# Patient Record
Sex: Male | Born: 1950 | Race: White | Hispanic: No | Marital: Married | Smoking: Never smoker
Health system: Southern US, Community
[De-identification: ages and names within clinical notes are randomized; demographics above are authoritative.]

## PROBLEM LIST (undated history)

## (undated) DIAGNOSIS — I10 Essential (primary) hypertension: Secondary | ICD-10-CM

## (undated) DIAGNOSIS — M2550 Pain in unspecified joint: Secondary | ICD-10-CM

## (undated) DIAGNOSIS — I38 Endocarditis, valve unspecified: Secondary | ICD-10-CM

## (undated) DIAGNOSIS — M549 Dorsalgia, unspecified: Secondary | ICD-10-CM

## (undated) DIAGNOSIS — R0602 Shortness of breath: Secondary | ICD-10-CM

## (undated) DIAGNOSIS — G4733 Obstructive sleep apnea (adult) (pediatric): Secondary | ICD-10-CM

## (undated) DIAGNOSIS — I2510 Atherosclerotic heart disease of native coronary artery without angina pectoris: Secondary | ICD-10-CM

## (undated) DIAGNOSIS — E785 Hyperlipidemia, unspecified: Secondary | ICD-10-CM

## (undated) DIAGNOSIS — T148XXA Other injury of unspecified body region, initial encounter: Secondary | ICD-10-CM

## (undated) DIAGNOSIS — R002 Palpitations: Secondary | ICD-10-CM

## (undated) DIAGNOSIS — J45909 Unspecified asthma, uncomplicated: Secondary | ICD-10-CM

## (undated) DIAGNOSIS — E119 Type 2 diabetes mellitus without complications: Secondary | ICD-10-CM

## (undated) HISTORY — PX: CARDIOVASCULAR STRESS TEST: SHX262

## (undated) HISTORY — DX: Atherosclerotic heart disease of native coronary artery without angina pectoris: I25.10

## (undated) HISTORY — DX: Dorsalgia, unspecified: M54.9

## (undated) HISTORY — DX: Other injury of unspecified body region, initial encounter: T14.8XXA

## (undated) HISTORY — DX: Endocarditis, valve unspecified: I38

## (undated) HISTORY — PX: TUMOR REMOVAL: SHX12

## (undated) HISTORY — DX: Palpitations: R00.2

## (undated) HISTORY — DX: Essential (primary) hypertension: I10

## (undated) HISTORY — PX: APPENDECTOMY: SHX54

## (undated) HISTORY — PX: CARDIAC CATHETERIZATION: SHX172

## (undated) HISTORY — DX: Obstructive sleep apnea (adult) (pediatric): G47.33

## (undated) HISTORY — PX: TONSILLECTOMY: SUR1361

## (undated) HISTORY — DX: Hyperlipidemia, unspecified: E78.5

## (undated) HISTORY — DX: Pain in unspecified joint: M25.50

## (undated) HISTORY — DX: Shortness of breath: R06.02

## (undated) HISTORY — DX: Unspecified asthma, uncomplicated: J45.909

## (undated) HISTORY — DX: Type 2 diabetes mellitus without complications: E11.9

---

## 2004-04-07 ENCOUNTER — Ambulatory Visit: Payer: Self-pay | Admitting: Internal Medicine

## 2004-04-21 ENCOUNTER — Ambulatory Visit: Payer: Self-pay | Admitting: Internal Medicine

## 2004-08-09 ENCOUNTER — Ambulatory Visit: Payer: Self-pay | Admitting: Family Medicine

## 2004-08-17 ENCOUNTER — Ambulatory Visit: Payer: Self-pay | Admitting: Family Medicine

## 2004-11-15 ENCOUNTER — Ambulatory Visit: Payer: Self-pay | Admitting: Family Medicine

## 2005-03-28 ENCOUNTER — Ambulatory Visit: Payer: Self-pay | Admitting: Family Medicine

## 2005-07-04 ENCOUNTER — Encounter: Admission: RE | Admit: 2005-07-04 | Discharge: 2005-07-04 | Payer: Self-pay | Admitting: Family Medicine

## 2005-07-04 ENCOUNTER — Ambulatory Visit: Payer: Self-pay | Admitting: Family Medicine

## 2006-04-28 ENCOUNTER — Ambulatory Visit: Payer: Self-pay | Admitting: Internal Medicine

## 2006-04-28 LAB — CONVERTED CEMR LAB
ALT: 15 units/L (ref 0–53)
AST: 14 units/L (ref 0–37)
BUN: 17 mg/dL (ref 6–23)
CO2: 23 meq/L (ref 19–32)
Calcium: 9.8 mg/dL (ref 8.4–10.5)
Chloride: 102 meq/L (ref 96–112)
Creatinine, Ser: 0.7 mg/dL (ref 0.40–1.50)
Glucose, Bld: 208 mg/dL — ABNORMAL HIGH (ref 70–99)
Hgb A1c MFr Bld: 7.4 % — ABNORMAL HIGH (ref 4.6–6.1)
Microalb, Ur: 3.08 mg/dL — ABNORMAL HIGH (ref 0.00–1.89)
Potassium: 3.9 meq/L (ref 3.5–5.3)
Sodium: 137 meq/L (ref 135–145)

## 2007-01-11 ENCOUNTER — Telehealth (INDEPENDENT_AMBULATORY_CARE_PROVIDER_SITE_OTHER): Payer: Self-pay | Admitting: *Deleted

## 2007-04-19 ENCOUNTER — Ambulatory Visit: Payer: Self-pay | Admitting: Family Medicine

## 2007-04-19 DIAGNOSIS — E1165 Type 2 diabetes mellitus with hyperglycemia: Secondary | ICD-10-CM

## 2007-04-19 DIAGNOSIS — E119 Type 2 diabetes mellitus without complications: Secondary | ICD-10-CM | POA: Insufficient documentation

## 2007-04-19 DIAGNOSIS — J45909 Unspecified asthma, uncomplicated: Secondary | ICD-10-CM | POA: Insufficient documentation

## 2007-05-02 LAB — CONVERTED CEMR LAB
ALT: 18 units/L (ref 0–53)
AST: 21 units/L (ref 0–37)
Albumin: 4.2 g/dL (ref 3.5–5.2)
Alkaline Phosphatase: 46 units/L (ref 39–117)
BUN: 9 mg/dL (ref 6–23)
Basophils Absolute: 0 10*3/uL (ref 0.0–0.1)
Basophils Relative: 0.6 % (ref 0.0–1.0)
Bilirubin, Direct: 0.1 mg/dL (ref 0.0–0.3)
CO2: 25 meq/L (ref 19–32)
Calcium: 9.7 mg/dL (ref 8.4–10.5)
Chloride: 100 meq/L (ref 96–112)
Cholesterol: 220 mg/dL (ref 0–200)
Creatinine, Ser: 0.8 mg/dL (ref 0.4–1.5)
Creatinine,U: 108.2 mg/dL
Direct LDL: 134.1 mg/dL
Eosinophils Absolute: 0.3 10*3/uL (ref 0.0–0.6)
Eosinophils Relative: 4.1 % (ref 0.0–5.0)
GFR calc Af Amer: 128 mL/min
GFR calc non Af Amer: 106 mL/min
Glucose, Bld: 153 mg/dL — ABNORMAL HIGH (ref 70–99)
HCT: 44.6 % (ref 39.0–52.0)
HDL: 47.1 mg/dL (ref >39.0)
Hemoglobin: 14.8 g/dL (ref 13.0–17.0)
Hgb A1c MFr Bld: 7.7 % — ABNORMAL HIGH (ref 4.6–6.0)
Lymphocytes Relative: 27.1 % (ref 12.0–46.0)
MCHC: 33.2 g/dL (ref 30.0–36.0)
MCV: 86.4 fL (ref 78.0–100.0)
Microalb Creat Ratio: 32.3 mg/g — ABNORMAL HIGH (ref 0.0–30.0)
Microalb, Ur: 3.5 mg/dL — ABNORMAL HIGH (ref 0.0–1.9)
Monocytes Absolute: 0.4 10*3/uL (ref 0.2–0.7)
Monocytes Relative: 6.5 % (ref 3.0–11.0)
Neutro Abs: 4 10*3/uL (ref 1.4–7.7)
Neutrophils Relative %: 61.7 % (ref 43.0–77.0)
Platelets: 256 10*3/uL (ref 150–400)
Potassium: 4.5 meq/L (ref 3.5–5.1)
RBC: 5.16 M/uL (ref 4.22–5.81)
RDW: 11.9 % (ref 11.5–14.6)
Sodium: 136 meq/L (ref 135–145)
Total Bilirubin: 0.8 mg/dL (ref 0.3–1.2)
Total CHOL/HDL Ratio: 4.7
Total Protein: 6.9 g/dL (ref 6.0–8.3)
Triglycerides: 165 mg/dL — ABNORMAL HIGH (ref 0–149)
VLDL: 33 mg/dL (ref 0–40)
WBC: 6.5 10*3/uL (ref 4.5–10.5)

## 2007-08-29 ENCOUNTER — Ambulatory Visit: Payer: Self-pay | Admitting: Internal Medicine

## 2007-08-29 DIAGNOSIS — L0390 Cellulitis, unspecified: Secondary | ICD-10-CM

## 2007-08-29 DIAGNOSIS — L0291 Cutaneous abscess, unspecified: Secondary | ICD-10-CM | POA: Insufficient documentation

## 2007-09-18 ENCOUNTER — Ambulatory Visit: Payer: Self-pay | Admitting: Family Medicine

## 2007-09-18 DIAGNOSIS — R609 Edema, unspecified: Secondary | ICD-10-CM | POA: Insufficient documentation

## 2007-09-27 ENCOUNTER — Encounter (INDEPENDENT_AMBULATORY_CARE_PROVIDER_SITE_OTHER): Payer: Self-pay | Admitting: *Deleted

## 2007-09-27 LAB — CONVERTED CEMR LAB
ALT: 13 units/L (ref 0–53)
AST: 16 units/L (ref 0–37)
Albumin: 4.2 g/dL (ref 3.5–5.2)
Alkaline Phosphatase: 44 units/L (ref 39–117)
BUN: 12 mg/dL (ref 6–23)
Bilirubin, Direct: 0.1 mg/dL (ref 0.0–0.3)
CO2: 29 meq/L (ref 19–32)
Calcium: 9.3 mg/dL (ref 8.4–10.5)
Chloride: 100 meq/L (ref 96–112)
Cholesterol: 160 mg/dL (ref 0–200)
Creatinine, Ser: 0.6 mg/dL (ref 0.4–1.5)
GFR calc Af Amer: 179 mL/min
GFR calc non Af Amer: 148 mL/min
Glucose, Bld: 120 mg/dL — ABNORMAL HIGH (ref 70–99)
HDL: 54 mg/dL (ref >39.0)
Hgb A1c MFr Bld: 6.7 % — ABNORMAL HIGH (ref 4.6–6.0)
LDL Cholesterol: 90 mg/dL (ref 0–99)
Potassium: 3.7 meq/L (ref 3.5–5.1)
Sodium: 138 meq/L (ref 135–145)
Total Bilirubin: 0.9 mg/dL (ref 0.3–1.2)
Total CHOL/HDL Ratio: 3
Total Protein: 6.9 g/dL (ref 6.0–8.3)
Triglycerides: 78 mg/dL (ref 0–149)
VLDL: 16 mg/dL (ref 0–40)

## 2007-10-19 ENCOUNTER — Encounter: Payer: Self-pay | Admitting: Family Medicine

## 2008-06-02 ENCOUNTER — Emergency Department (HOSPITAL_BASED_OUTPATIENT_CLINIC_OR_DEPARTMENT_OTHER): Admission: EM | Admit: 2008-06-02 | Discharge: 2008-06-02 | Payer: Self-pay | Admitting: Emergency Medicine

## 2008-06-02 ENCOUNTER — Telehealth (INDEPENDENT_AMBULATORY_CARE_PROVIDER_SITE_OTHER): Payer: Self-pay | Admitting: *Deleted

## 2008-06-02 ENCOUNTER — Ambulatory Visit: Payer: Self-pay | Admitting: Radiology

## 2008-06-03 ENCOUNTER — Encounter: Payer: Self-pay | Admitting: Family Medicine

## 2008-06-04 ENCOUNTER — Encounter (INDEPENDENT_AMBULATORY_CARE_PROVIDER_SITE_OTHER): Payer: Self-pay | Admitting: *Deleted

## 2008-06-04 ENCOUNTER — Telehealth (INDEPENDENT_AMBULATORY_CARE_PROVIDER_SITE_OTHER): Payer: Self-pay | Admitting: *Deleted

## 2008-06-04 DIAGNOSIS — R930 Abnormal findings on diagnostic imaging of skull and head, not elsewhere classified: Secondary | ICD-10-CM | POA: Insufficient documentation

## 2008-06-10 ENCOUNTER — Ambulatory Visit: Payer: Self-pay | Admitting: Family Medicine

## 2008-06-10 ENCOUNTER — Encounter: Payer: Self-pay | Admitting: Family Medicine

## 2008-06-11 ENCOUNTER — Ambulatory Visit (HOSPITAL_COMMUNITY): Admission: RE | Admit: 2008-06-11 | Discharge: 2008-06-11 | Payer: Self-pay | Admitting: Cardiovascular Disease

## 2008-06-30 ENCOUNTER — Encounter (INDEPENDENT_AMBULATORY_CARE_PROVIDER_SITE_OTHER): Payer: Self-pay | Admitting: *Deleted

## 2008-08-21 ENCOUNTER — Encounter: Payer: Self-pay | Admitting: Family Medicine

## 2008-10-31 ENCOUNTER — Telehealth (INDEPENDENT_AMBULATORY_CARE_PROVIDER_SITE_OTHER): Payer: Self-pay | Admitting: *Deleted

## 2008-12-15 ENCOUNTER — Encounter (INDEPENDENT_AMBULATORY_CARE_PROVIDER_SITE_OTHER): Payer: Self-pay | Admitting: *Deleted

## 2008-12-15 ENCOUNTER — Telehealth (INDEPENDENT_AMBULATORY_CARE_PROVIDER_SITE_OTHER): Payer: Self-pay | Admitting: *Deleted

## 2009-03-20 ENCOUNTER — Ambulatory Visit: Payer: Self-pay | Admitting: Family Medicine

## 2009-03-20 DIAGNOSIS — I10 Essential (primary) hypertension: Secondary | ICD-10-CM | POA: Insufficient documentation

## 2009-03-20 DIAGNOSIS — Z9119 Patient's noncompliance with other medical treatment and regimen: Secondary | ICD-10-CM | POA: Insufficient documentation

## 2009-03-20 DIAGNOSIS — M948X9 Other specified disorders of cartilage, unspecified sites: Secondary | ICD-10-CM | POA: Insufficient documentation

## 2009-03-23 LAB — CONVERTED CEMR LAB
ALT: 25 units/L (ref 0–53)
AST: 20 units/L (ref 0–37)
Albumin: 4.2 g/dL (ref 3.5–5.2)
Alkaline Phosphatase: 56 units/L (ref 39–117)
BUN: 16 mg/dL (ref 6–23)
Basophils Absolute: 0.1 10*3/uL (ref 0.0–0.1)
Basophils Relative: 0.8 % (ref 0.0–3.0)
Bilirubin, Direct: 0 mg/dL (ref 0.0–0.3)
CO2: 28 meq/L (ref 19–32)
Calcium: 9.3 mg/dL (ref 8.4–10.5)
Chloride: 103 meq/L (ref 96–112)
Cholesterol: 268 mg/dL — ABNORMAL HIGH (ref 0–200)
Creatinine, Ser: 0.7 mg/dL (ref 0.4–1.5)
Creatinine,U: 140.8 mg/dL
Direct LDL: 198.6 mg/dL
Eosinophils Absolute: 0.3 10*3/uL (ref 0.0–0.7)
Eosinophils Relative: 4.6 % (ref 0.0–5.0)
GFR calc non Af Amer: 122.56 mL/min (ref >60)
Glucose, Bld: 173 mg/dL — ABNORMAL HIGH (ref 70–99)
HCT: 45.3 % (ref 39.0–52.0)
HDL: 57.1 mg/dL (ref >39.00)
Hemoglobin: 14.8 g/dL (ref 13.0–17.0)
Hgb A1c MFr Bld: 7.8 % — ABNORMAL HIGH (ref 4.6–6.5)
Lymphocytes Relative: 25.5 % (ref 12.0–46.0)
Lymphs Abs: 1.6 10*3/uL (ref 0.7–4.0)
MCHC: 32.6 g/dL (ref 30.0–36.0)
MCV: 89.8 fL (ref 78.0–100.0)
Microalb Creat Ratio: 19.2 mg/g (ref 0.0–30.0)
Microalb, Ur: 2.7 mg/dL — ABNORMAL HIGH (ref 0.0–1.9)
Monocytes Absolute: 0.5 10*3/uL (ref 0.1–1.0)
Monocytes Relative: 8.3 % (ref 3.0–12.0)
Neutro Abs: 3.8 10*3/uL (ref 1.4–7.7)
Neutrophils Relative %: 60.8 % (ref 43.0–77.0)
PSA: 1.93 ng/mL (ref 0.10–4.00)
Platelets: 216 10*3/uL (ref 150.0–400.0)
Potassium: 3.9 meq/L (ref 3.5–5.1)
RBC: 5.05 M/uL (ref 4.22–5.81)
RDW: 11.7 % (ref 11.5–14.6)
Sodium: 138 meq/L (ref 135–145)
Total Bilirubin: 1.1 mg/dL (ref 0.3–1.2)
Total CHOL/HDL Ratio: 5
Total Protein: 7.2 g/dL (ref 6.0–8.3)
Triglycerides: 189 mg/dL — ABNORMAL HIGH (ref 0.0–149.0)
VLDL: 37.8 mg/dL (ref 0.0–40.0)
WBC: 6.3 10*3/uL (ref 4.5–10.5)

## 2009-04-03 ENCOUNTER — Encounter (HOSPITAL_COMMUNITY): Admission: RE | Admit: 2009-04-03 | Discharge: 2009-06-04 | Payer: Self-pay | Admitting: Family Medicine

## 2010-03-21 ENCOUNTER — Encounter: Payer: Self-pay | Admitting: Internal Medicine

## 2010-03-30 NOTE — Letter (Signed)
Summary: Results Follow-up Letter  [place] at [facility]  [address] [place], [place] [address]   Phone: [phone]  Fax: [phone]    06/30/2008        Zachary Michael [address] [place], [place]  [address]  Dear [name],   The following are the results of your recent test(s):  Test     Result     Pap Smear    Normal_______  Not Normal_____       Comments: _________________________________________________________ Cholesterol LDL(Bad cholesterol):          Your goal is less than:         HDL (Good cholesterol):        Your goal is more than: _________________________________________________________ Other Tests:   _________________________________________________________  Please call for an appointment Or Please see attached bone density._________________________________________________________ _________________________________________________________ _________________________________________________________  Sincerely,  Felecia Deloach CMA [place] at [facility]

## 2010-03-30 NOTE — Progress Notes (Signed)
Summary: fyi ED//lowne  Phone Note Call from Patient Call back at Work Phone [phone]   Caller: Patient Summary of Call: pt called of chest pains mid center pressure, into head pariculary left side. Recommend pt to ED for full assessment pt agreed Initial call taken by: Kandice Hams,  June 02, 2008 10:21 AM      Appended Document: Orders Update flag received from Dr Lesia Hausen---- ER called.  Pt had abnormality on CXR that needs f/u.  Needs Bone Scan to f/u abn on rib   Clinical Lists Changes  Problems: Added new problem of CHEST XRAY, ABNORMAL (ICD-793.1) Orders: Added new Referral order of Radiology Referral (Radiology) - Signed

## 2010-03-30 NOTE — Progress Notes (Signed)
Summary: REFILL  Phone Note Refill Request Message from:  [facility]arm[id number] FAX [phone]  Refills Requested: Medication #1:  ACTOS 45 MG  TABS 1 by mouth once daily Initial call taken by: Barb Merino,  October 31, 2008 4:35 PM    Prescriptions: METFORMIN HCL 1000 MG  TABS (METFORMIN HCL) take one tablet twice daily with meals  #60 x 0   Entered by:   Kandice Hams   Authorized by:   Loreen Freud DO   Signed by:   Kandice Hams on 10/31/2008   Method used:   [facility]ed to ...       [facility] [address] (retail)       [address]       [place], [place]  [address]       Ph: [phone] or [phone]       Fax: [phone]   RxID:   [id number]

## 2010-03-30 NOTE — Medication Information (Signed)
Summary: Letter Concerning Potential Interaction Between ACTOS & Edema/Me  Letter Concerning Potential Interaction Between ACTOS & Ed[facility]   Imported By: Lanelle Bal 10/19/2007 12:04:43  _____________________________________________________________________  External Attachment:    Type:   Image     Comment:   External Document

## 2010-03-30 NOTE — Letter (Signed)
Summary: Results Follow-up Letter  [place] at [facility]  [address] [place], [place] [address]   Phone: [phone]  Fax: [phone]    06/04/2008        Zachary Michael [address] [place], [place]  [address]  Dear Mr. Sotelo,   The following are the results of your recent test(s):  Test     Result     Pap Smear    Normal_______  Not Normal_____       Comments: _________________________________________________________ Cholesterol LDL(Bad cholesterol):          Your goal is less than:         HDL (Good cholesterol):        Your goal is more than: _________________________________________________________ Other Tests:   _________________________________________________________  Please call for an appointment Or _Please call our office, we have been trying to contact you in regards to your xray results.________________________________________________________ _________________________________________________________ _________________________________________________________  Sincerely,  Ardy[place] at [facility]

## 2010-03-30 NOTE — Progress Notes (Signed)
Summary: Mailed letter requesting pt. to call office 06/04/08  Phone Note Outgoing Call Call back at [facility] Phone [phone] Call back at Work Phone [phone]   Call placed by: Ardyth Man,  June 04, 2008 12:29 PM Call placed to: Patient Summary of Call: flag received from Dr Lesia Hausen---- ER called.  Pt had abnormality on CXR that needs f/u.  Needs Bone Scan to f/u abn on rib    Both #'s work and home have been disconnected.  Will mail letter asking patient to call the office Ardyth Man  June 04, 2008 12:30 PM

## 2010-03-30 NOTE — Miscellaneous (Signed)
Summary: Orders Update  Clinical Lists Changes  Problems: Added new problem of FAMILY HISTORY OSTEOPOROSIS (ICD-V17.81)

## 2010-03-30 NOTE — Progress Notes (Signed)
Summary: lowne-refill  Phone Note Refill Request Message from:  Fax from Pharmacy on [facility]  Refills Requested: Medication #1:  METFORMIN HCL 1000 MG  TABS take one tablet twice daily with meals  Medication #2:  ACTOS 45 MG  TABS 1 by mouth once daily fax [phone]  Initial call taken by: [facility] CMA,  December 15, 2008 11:40 AM  Follow-up for Phone Call        please call pt to schedule labs and OV.250.00,272.4  lipid, hep, hgba1c, bmp........Marland KitchenFelecia Deloach CMA  December 15, 2008 11:57 AM   tried to contact pt VM not set up on phone,letter mailed informing pt labs and OV due now.......Marland KitchenFelecia Deloach CMA  December 15, 2008 11:56 AM     Additional Follow-up for Phone Call Additional follow up Details #2::    LEFT MESSAGE WITH WIFE FOR PT TO CALL AND ASK FOR KENDRA...Marland KitchenBarb Merino  December 16, 2008 8:34 AM  Follow-up by: Barb Merino,  December 16, 2008 8:34 AM  New/Updated Medications: METFORMIN HCL 1000 MG  TABS (METFORMIN HCL) take one tablet twice daily with meals**LABS & OFFICE VISIT DUE NOW ACTOS 45 MG  TABS (PIOGLITAZONE HCL) 1 by mouth once daily**LABS OFFICE VISIT DUE NOW** Prescriptions: ACTOS 45 MG  TABS (PIOGLITAZONE HCL) 1 by mouth once daily**LABS OFFICE VISIT DUE NOW**  #90 x 0   Entered by:   Jeremy Johann CMA   Authorized by:   Loreen Freud DO   Signed by:   Jeremy Johann CMA on 12/15/2008   Method used:   [facility]ed to ...       [id number]       [address]       [place], [place]  [address]       Ph: [phone] or [phone]       Fax: [phone]   RxID:   [id number] METFORMIN HCL 1000 MG  TABS (METFORMIN HCL) take one tablet twice daily with meals**LABS & OFFICE VISIT DUE NOW  #180 x 0   Entered by:   Jeremy Johann CMA   Authorized by:   Loreen Freud DO   Signed by:   Jeremy Johann CMA on 12/15/2008   Method used:   F[facility]ed to ...       [facility] [address] (retail)       [address]       [place], [place]   [address]       Ph: [phone] or [phone]       Fax: [phone]   RxID:   [id number]

## 2010-03-30 NOTE — Assessment & Plan Note (Signed)
Summary: acute for right leg pain--PH   Vital Signs:  Patient Profile:   60 Years Old Male Weight:      249.0 pounds Temp:     98.0 degrees F oral Pulse rate:   84 / minute Resp:     13 per minute BP sitting:   120 / 70  (left arm) Cuff size:   large  Pt. in pain?   yes    Location:   RLE ant shin    Intensity:   4    Type:       burning with walking  Vitals Entered By: Shonna Chock (August 29, 2007 3:35 PM)                   Chief Complaint:  RIGHT LEG PAIN X 2 DAYS.  History of Present Illness: No  trigger or injury sun 6/28.Rx: none. Worse with ambulation but also pain @ rest. Pain in 25 cent piece distribution with inf extension with walking. Camping last week ; bitten by mosquitoes RLE. Tick found in trailer.FBS average  <.130     Current Allergies (reviewed today): No known allergies     Risk Factors:  Alcohol use:  no Exercise:  no    Physical Exam  General:     Well-developed,well-nourished,in no acute distress; alert,appropriate and cooperative throughout examination Pulses:     R and Ldorsalis pedis and posterior tibial pulses are full and equal bilaterally Extremities:     1+ left pedal edema and trace right pedal edema.  Neg  Homan's bilat . Tender R ant shin  Skin:     Mild erythema L mid shin with 3X6 mm eschar which is tender to touch    Impression & Recommendations:  Problem # 1:  CELLULITIS AND ABSCESS OF UNSPECIFIED SITE (ICD-682.9)  His updated medication list for this problem includes:    Doxycycline Hyclate 100 Mg Caps (Doxycycline hyclate) .[name]... 1 two times a day   Complete Medication List: 1)  Metformin Hcl 1000 Mg Tabs (Metformin hcl) .... Take one tablet twice daily with meals 2)  Actos 45 Mg Tabs (Pioglitazone hcl) .[name].. 1 by mouth once daily 3)  Zocor 80 Mg Tabs (Simvastatin) .[name].. 1 by mouth at bedtime 4)  Doxycycline Hyclate 100 Mg Caps (Doxycycline hyclate) .[name].. 1 two times a day   Patient Instructions: 1)  Warm  compresses 3-4X/day with elevation. Avoid sun on Doxycycline   Prescriptions: DOXYCYCLINE HYCLATE 100 MG  CAPS (DOXYCYCLINE HYCLATE) 1 two times a day  #14 x 0   Entered and Authorized by:   Marga Melnick MD   Signed by:   Marga Melnick MD on 08/29/2007   Method used:   Print then Give to Patient   RxID:   [id number]  ]  Appended Document: acute for right leg pain--PH RLE 41.5 cm 20 cm below R inf patella; L 44 cm

## 2010-03-30 NOTE — Progress Notes (Signed)
Summary: LOWNE-REFILL METFORMIN HCL 1000MG    Phone Note Refill Request   Refills Requested: Medication #1:  METFORMIN HCL 1000 MG  TABS take one tablet twice daily with meals recd by fax [facility] [phone] ***fax to follow***  Initial call taken by: [name] Pounds,  January 11, 2007 5:11 PM  Follow-up for Phone Call        DR.LOWNE, THIS PATIENT HAD LABS IN JAN 2008 WAS TOLD TO RECHECK IN 3 MONTHS, WE HAVE CONTINUED TO FILL THIS MED MAKING NOTE THAT PATIENT NEEDS OV FOR ADDITIONAL REFILLS. I HAVE SENT A NOTE TO TIFFANY(SCHEDULER) TO SCHEDULE HIM A FASTING LAB OV. I WILL FILL WHEN HE SCHEDULES OV. Follow-up by: Shonna Chock,  January 12, 2007 4:35 PM  Additional Follow-up for Phone Call Additional follow up Details #1::        perfect---   lipid, hgba1c, bmp, hep, microalb  250.00 Additional Follow-up by: Loreen Freud DO,  January 12, 2007 6:10 PM    Additional Follow-up for Phone Call Additional follow up Details #2::    the number [phone] has been disconnected. also his number [phone] also has been disconnceted ..................................................................Marland KitchenCharolette Child  January 15, 2007 3:03 PM per dr.lowne deny rx refill and make note that patient needs ov and fax original request back to [facility]. done ..................................................................Marland KitchenChrae Malloy  January 17, 2007 3:00 PM        Appended Document: LOWNE-REFILL METFORMIN HCL 1000MG   Pt wife came in.  When pt made appt. , he wasnot given one until JUne.   We gave him one next Thurs. and [facility].   We will give 3 month supply next week at ov.

## 2010-03-30 NOTE — Assessment & Plan Note (Signed)
Summary: OUT OF ALL MEDS---OFF WORK TODAY///SPH   Vital Signs:  Patient profile:   60 year old male Height:      70 inches Weight:      259 pounds BMI:     37.30 Temp:     98.2 degrees F oral Pulse rate:   82 / minute Pulse rhythm:   regular BP sitting:   138 / 82  (left arm) Cuff size:   large  Vitals Entered By: Army Fossa CMA (March 20, 2009 10:48 AM) CC: Pt here for refills on his meds has not taken any in 3 months.    History of Present Illness: Pt here for refills and labs.  He has not taken meds in 3 months.    Allergies (verified): No Known Drug Allergies  Past History:  Past medical, surgical, family and social histories (including risk factors) reviewed for relevance to current acute and chronic problems.  Past Medical History: Reviewed history from 04/19/2007 and no changes required. Asthma Diabetes mellitus, type II  Past Surgical History: Reviewed history from 04/19/2007 and no changes required. Appendectomy Tonsillectomy tumor removed L arm  Family History: Reviewed history from 04/19/2007 and no changes required. Family History of Arthritis Family History Diabetes 1st degree relative Family History Hypertension Family History Lung cancer  Social History: Reviewed history from 04/19/2007 and no changes required. Married Never Smoked  Review of Systems      See HPI  Physical Exam  General:  Well-developed,well-nourished,in no acute distress; alert,appropriate and cooperative throughout examination Neck:  No deformities, masses, or tenderness noted. Lungs:  Normal respiratory effort, chest expands symmetrically. Lungs are clear to auscultation, no crackles or wheezes. Heart:  normal rate and no murmur.    Diabetes Management Exam:    Foot Exam (with socks and/or shoes not present):       Sensory-Pinprick/Light touch:          Left medial foot (L-4): normal          Left dorsal foot (L-5): normal          Left lateral foot (S-1):  normal          Right medial foot (L-4): normal          Right dorsal foot (L-5): normal          Right lateral foot (S-1): normal       Sensory-Monofilament:          Left foot: normal          Right foot: normal       Inspection:          Left foot: normal          Right foot: normal       Nails:          Left foot: normal          Right foot: normal   Impression & Recommendations:  Problem # 1:  DIABETES MELLITUS, TYPE II (ICD-250.00)  His updated medication list for this problem includes:    Metformin Hcl 1000 Mg Tabs (Metformin hcl) .[name]... Take one tablet twice daily with meals**labs & office visit due now    Actos 45 Mg Tabs (Pioglitazone hcl) .[name]... 1 by mouth once daily**labs office visit due now**    Lisinopril 10 Mg Tabs (Lisinopril) .[name]... 1 by mouth daily.  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 1 by mouth two  times a day.    Lisinopril 10 Mg Tabs (Lisinopril) .[name]... 1 by mouth daily.  BP today: 138/82 Prior BP: 130/80 (09/18/2007)  Labs Reviewed: K+: 3.7 (09/18/2007) Creat: : 0.6 (09/18/2007)   Chol: 160 (09/18/2007)   HDL: 54.0 (09/18/2007)   LDL: 90 (09/18/2007)   TG: 78 (09/18/2007)  Problem # 4:  PERS HX NONCOMPLIANCE W/MED TX PRS HAZARDS HLTH (ICD-V15.81) d/w pt noncompliance----he said as long as he lives long enough to get his wife into new house he doesn't care if he dies. They have no extra money for meds  all but 2 meds are  generic  Complete Medication List: 1)  Metformin Hcl 1000 Mg Tabs (Metformin hcl) .... Take one tablet twice daily with meals**labs & office visit due now 2)  Actos 45 Mg Tabs (Pioglitazone hcl) .[name].. 1 by mouth once daily**labs office visit due now** 3)  Zocor 80 Mg Tabs (Simvastatin) .[name].. 1 by mouth at bedtime 4)  Zetia 10 Mg Tabs (Ezetimibe) .... Take one tablet daily 5)  Nitro-dur 0.4 Mg/hr Pt24 (Nitroglycerin) .... As needed. 6)  Carvedilol 6.25 Mg Tabs (Carvedilol) .[name].. 1 by mouth two times a day. 7)  Lisinopril 10 Mg Tabs (Lisinopril) .[name].. 1 by mouth daily. Prescriptions: ZOCOR 80 MG  TABS (SIMVASTATIN) 1 by mouth at bedtime  #90 x 0   Entered and Authorized by:   Loreen Freud DO   Signed by:   Loreen Freud DO on 03/20/2009   Method used:   Electronically to        [facility]* (mail-order)             ,          Ph: [phone]       Fax: [phone]   RxID:   [id number] ZETIA 10 MG  TABS (EZETIMIBE) Take one tablet daily  #90 x 3   Entered and Authorized by:   Loreen Freud DO   Signed by:   Loreen Freud DO on 03/20/2009   Method used:   Electronically to        [facility]* (mail-order)             ,          Ph: [phone]       Fax: [phone]   RxID:   [id number] ACTOS 45 MG  TABS (PIOGLITAZONE HCL) 1 by mouth once daily**LABS OFFICE VISIT DUE NOW**  #90 x 3   Entered and Authorized by:   Loreen Freud DO   Signed  by:   Loreen Freud DO on 03/20/2009   Method used:   Electronically to        [facility]* (mail-order)             ,  Ph: [phone]       Fax: [phone]   RxID:   [id number] METFORMIN HCL 1000 MG  TABS (METFORMIN HCL) take one tablet twice daily with meals**LABS & OFFICE VISIT DUE NOW  #180 x 3   Entered and Authorized by:   Loreen Freud DO   Signed by:   Loreen Freud DO on 03/20/2009   Method used:   Electronically to        [facility]* (mail-order)             ,          Ph: [phone]       Fax: [phone]   RxID:   [id number]

## 2010-03-30 NOTE — Assessment & Plan Note (Signed)
Summary: med refill and labs/cdj   Vital Signs:  Patient Profile:   60 Years Old Male Weight:      239 pounds Pulse rate:   80 / minute BP sitting:   128 / 82  (left arm)  Vitals Entered By: Doristine Devoid (April 19, 2007 10:53 AM)                 Chief Complaint:  Diabetes Management and Type 2 diabetes mellitus follow-up.  History of Present Illness:  Type 2 Diabetes Mellitus Follow-Up      This is a 60 year old man who presents for Type 2 diabetes mellitus follow-up.  The patient denies polyuria, polydipsia, blurred vision, self managed hypoglycemia, hypoglycemia requiring help, weight loss, weight gain, and numbness of extremities.  The patient denies the following symptoms: neuropathic pain, chest pain, vomiting, orthostatic symptoms, poor wound healing, intermittent claudication, vision loss, and foot ulcer.  Since the last visit the patient reports poor dietary compliance, noncompliance with medications, not exercising regularly, and not monitoring blood glucose.  Since the last visit, the patient reports having had eye care by an ophthalmologist and no foot care.      Past Medical History:    Asthma    Diabetes mellitus, type II  Past Surgical History:    Appendectomy    Tonsillectomy    tumor removed L arm   Family History:    Family History of Arthritis    Family History Diabetes 1st degree relative    Family History Hypertension    Family History Lung cancer  Social History:    Married    Never Smoked   Risk Factors:  Tobacco use:  never   Review of Systems      See HPI   Physical Exam  General:     Well-developed,well-nourished,in no acute distress; alert,appropriate and cooperative throughout examination Eyes:     vision grossly intact, pupils equal, pupils round, and no injection.   Lungs:     Normal respiratory effort, chest expands symmetrically. Lungs are clear to auscultation, no crackles or wheezes. Heart:     Normal rate and  regular rhythm. S1 and S2 normal without gallop, murmur, click, rub or other extra sounds. Extremities:     No clubbing, cyanosis, edema, or deformity noted with normal full range of motion of all joints.    Diabetes Management Exam:    Foot Exam (with socks and/or shoes not present):       Sensory-Pinprick/Light touch:          Left medial foot (L-4): normal          Left dorsal foot (L-5): normal          Left lateral foot (S-1): normal          Right medial foot (L-4): normal          Right dorsal foot (L-5): normal          Right lateral foot (S-1): normal       Sensory-Monofilament:          Left foot: normal          Right foot: normal       Inspection:          Left foot: normal          Right foot: normal       Nails:          Left foot: normal  Right foot: normal    Eye Exam:       Eye Exam done elsewhere    Impression & Recommendations:  Problem # 1:  DIABETES MELLITUS, TYPE II (ICD-250.00)  His updated medication list for this problem includes:    Metformin Hcl 1000 Mg Tabs (Metformin hcl) .[name]... Take one tablet twice daily with meals    Actos 30 Mg Tabs (Pioglitazone hcl) .[name]... 1 by mouth once daily  Orders: Venipuncture ([address]) TLB-Lipid Panel (80061-LIPID) TLB-BMP (Basic Metabolic Panel-BMET) (80048-METABOL) TLB-CBC Platelet - w/Differential (85025-CBCD) TLB-Hepatic/Liver Function Pnl (80076-HEPATIC) TLB-A1C / Hgb A1C (Glycohemoglobin) (83036-A1C) TLB-Microalbumin/Creat Ratio, Urine (82043-MALB)  Labs Reviewed: HgBA1c: 7.4 (04/28/2006)   Creat: 0.70 (04/28/2006)      Complete Medication List: 1)  Metformin Hcl 1000 Mg Tabs (Metformin hcl) .... Take one tablet twice daily with meals 2)  Paroxetine Hcl 30 Mg Tabs (Paroxetine hcl) .... Take one tablet daily 3)  Actos 30 Mg Tabs (Pioglitazone hcl) .[name].. 1 by mouth once daily                                                              Prescriptions: ACTOS 30 MG  TABS (PIOGLITAZONE HCL) 1 by  mouth once daily  #90 x 3   Entered and Authorized by:   Loreen Freud DO   Signed by:   Loreen Freud DO on 04/19/2007   Method used:   Print then Give to Patient   RxID:   [id number] METFORMIN HCL 1000 MG  TABS (METFORMIN HCL) take one tablet twice daily with meals  #180 x 3   Entered and Authorized by:   Loreen Freud DO   Signed by:   Loreen Freud DO on 04/19/2007   Method used:   Print then Give to Patient   RxID:   [id number]  ]  Appended Document: med refill and labs/cdj  Laboratory Results   Urine Tests   Date/Time Reported: April 19, 2007 11:35 AM   Routine Urinalysis   Color: yellow Appearance: Hazy Glucose: negative   (Normal Range: Negative) Bilirubin: negative   (Normal Range: Negative) Ketone: negative   (Normal Range: Negative) Spec. Gravity: 1.015   (Normal Range: 1.003-1.035) Blood: negative   (Normal Range: Negative) pH: 6.0   (Normal Range: 5.0-8.0) Protein: trace   (Normal Range: Negative) Urobilinogen: negative   (Normal Range: 0-1) Nitrite: negative   (Normal Range: Negative) Leukocyte Esterace: negative   (Normal Range: Negative)    Comments: ...................................................................Marland KitchenFloydene Flock CMA  April 19, 2007 11:36 AM

## 2010-03-30 NOTE — Assessment & Plan Note (Signed)
Summary: FOLLOW UP ON SWELLING/CDJ   Vital Signs:  Patient Profile:   60 Years Old Male Weight:      249 pounds Temp:     98.4 degrees F oral Pulse rate:   76 / minute Resp:     14 per minute BP sitting:   130 / 80  (right arm)  Pt. in pain?   no  Vitals Entered By: Ardyth Man (September 18, 2007 1:19 PM)                  PCP:  Laury Axon  Chief Complaint:  follow up on swelling in legs, "getting better, and but still there".  History of Present Illness: Pt is f/u on swelling in legs.  The infection is better but there is still some swelling in both leg R>L. No more pain.       Current Allergies: No known allergies   Past Medical History:    Reviewed history from 04/19/2007 and no changes required:       Asthma       Diabetes mellitus, type II  Past Surgical History:    Reviewed history from 04/19/2007 and no changes required:       Appendectomy       Tonsillectomy       tumor removed L arm   Family History:    Reviewed history from 04/19/2007 and no changes required:       Family History of Arthritis       Family History Diabetes 1st degree relative       Family History Hypertension       Family History Lung cancer  Social History:    Reviewed history from 04/19/2007 and no changes required:       Married       Never Smoked    Review of Systems      See HPI   Physical Exam  General:     Well-developed,well-nourished,in no acute distress; alert,appropriate and cooperative throughout examination Neck:     No deformities, masses, or tenderness noted. Lungs:     Normal respiratory effort, chest expands symmetrically. Lungs are clear to auscultation, no crackles or wheezes. Heart:     normal rate, regular rhythm, and no murmur.   Extremities:     left pretibial edema and right pretibial edema.  no calf pain  no errythema Psych:     Oriented X3, memory intact for recent and remote, and normally interactive.      Impression &  Recommendations:  Problem # 1:  EDEMA (ICD-782.3) Discussed elevation of the legs, use of compression stockings, sodium restiction, and medication use. --- pt did not want to use medication at this time He will rto if symptoms do not improve.  Problem # 2:  CELLULITIS AND ABSCESS OF UNSPECIFIED SITE (ICD-682.9) Assessment: Improved  The following medications were removed from the medication list:    Doxycycline Hyclate 100 Mg Caps (Doxycycline hyclate) .[name]... 1 two times a day  His updated medication list for this problem includes:    Doxycycline Hyclate 100 Mg Tabs (Doxycycline hyclate) .[name]... 1 by mouth two times a day for 7 days   Complete Medication List: 1)  Metformin Hcl 1000 Mg Tabs (Metformin hcl) .... Take one tablet twice daily with meals 2)  Actos 45 Mg Tabs (Pioglitazone hcl) .[name].. 1 by mouth once daily 3)  Zocor 80 Mg Tabs (Simvastatin) .[name].. 1 by mouth at bedtime 4)  Doxycycline Hyclate 100 Mg Tabs (Doxycycline  hyclate) .[name].. 1 by mouth two times a day for 7 days  Other Orders: Venipuncture (91478) TLB-Lipid Panel (80061-LIPID) TLB-BMP (Basic Metabolic Panel-BMET) (80048-METABOL) TLB-Hepatic/Liver Function Pnl (80076-HEPATIC) TLB-A1C / Hgb A1C (Glycohemoglobin) (83036-A1C)    Prescriptions: DOXYCYCLINE HYCLATE 100 MG  TABS (DOXYCYCLINE HYCLATE) 1 by mouth two times a day for 7 days  #14 x 0   Entered and Authorized by:   Loreen Freud DO   Signed by:   Loreen Freud DO on 09/18/2007   Method used:   Electronically sent to ...       [id number]       [address]       [place], [place]  [address]       Ph: [phone] or [phone]       Fax: [phone]   RxID:   [id number] ZOCOR 80 MG  TABS (SIMVASTATIN) 1 by mouth at bedtime  #90 x 3   Entered and Authorized by:   Loreen Freud DO   Signed by:   Loreen Freud DO on 09/18/2007   Method used:   Print then Give to Patient   RxID:   [id number] ACTOS 45 MG  TABS (PIOGLITAZONE HCL) 1 by mouth  once daily  #90 x 3   Entered and Authorized by:   Loreen Freud DO   Signed by:   Loreen Freud DO on 09/18/2007   Method used:   Print then Give to Patient   RxID:   [id number] METFORMIN HCL 1000 MG  TABS (METFORMIN HCL) take one tablet twice daily with meals  #180 x 3   Entered and Authorized by:   Loreen Freud DO   Signed by:   Loreen Freud DO on 09/18/2007   Method used:   Print then Give to Patient   RxID:   [id number]  ]

## 2010-03-30 NOTE — Letter (Signed)
Summary: [facility]   Imported By: Lanelle Bal 06/12/2008 12:28:30  _____________________________________________________________________  External Attachment:    Type:   Image     Comment:   External Document

## 2010-03-30 NOTE — Miscellaneous (Signed)
Summary: BONE DENSITY  Clinical Lists Changes  Orders: Added new Test order of T-Bone Densitometry (77080) - Signed Added new Test order of T-Lumbar Vertebral Assessment (77082) - Signed 

## 2010-03-30 NOTE — Letter (Signed)
Summary: [facility]   Imported By: Lanelle Bal 08/27/2008 14:13:54  _____________________________________________________________________  External Attachment:    Type:   Image     Comment:   External Document

## 2010-03-30 NOTE — Letter (Signed)
Summary: Results Follow-up Letter  [place] [facility]  [address] [place], [place] [address]   Phone: [phone]  Fax: [phone]    09/27/2007        Zachary Michael [address] [place], [place]  [address]  Dear [name],   The following are the results of your recent test(s):  Test     Result     Pap Smear    Normal_______  Not Normal_____       Comments: _________________________________________________________ Cholesterol LDL(Bad cholesterol):          Your goal is less than:         HDL (Good cholesterol):        Your goal is more than: _________________________________________________________ Other Tests:   _________________________________________________________  Please call for an appointment Or ___Please see attached lab report and new medication added.______________________________________________________ _________________________________________________________ _________________________________________________________  Sincerely,  Ardyth Man [place] [facility]

## 2010-06-09 LAB — DIFFERENTIAL
Basophils Absolute: 0 10*3/uL (ref 0.0–0.1)
Basophils Relative: 1 % (ref 0–1)
Eosinophils Absolute: 0.2 10*3/uL (ref 0.0–0.7)
Eosinophils Relative: 3 % (ref 0–5)
Lymphocytes Relative: 21 % (ref 12–46)
Lymphs Abs: 1.4 10*3/uL (ref 0.7–4.0)
Monocytes Absolute: 0.4 10*3/uL (ref 0.1–1.0)
Monocytes Relative: 6 % (ref 3–12)
Neutro Abs: 4.9 10*3/uL (ref 1.7–7.7)
Neutrophils Relative %: 69 % (ref 43–77)

## 2010-06-09 LAB — COMPREHENSIVE METABOLIC PANEL
ALT: 14 U/L (ref 0–53)
AST: 26 U/L (ref 0–37)
Albumin: 4.4 g/dL (ref 3.5–5.2)
Alkaline Phosphatase: 51 U/L (ref 39–117)
BUN: 19 mg/dL (ref 6–23)
CO2: 26 mEq/L (ref 19–32)
Calcium: 9.2 mg/dL (ref 8.4–10.5)
Chloride: 104 mEq/L (ref 96–112)
Creatinine, Ser: 0.6 mg/dL (ref 0.4–1.5)
GFR calc Af Amer: >60 mL/min (ref >60)
GFR calc non Af Amer: >60 mL/min (ref >60)
Glucose, Bld: 115 mg/dL — ABNORMAL HIGH (ref 70–99)
Potassium: 3.9 mEq/L (ref 3.5–5.1)
Sodium: 139 mEq/L (ref 135–145)
Total Bilirubin: 0.7 mg/dL (ref 0.3–1.2)
Total Protein: 7.3 g/dL (ref 6.0–8.3)

## 2010-06-09 LAB — CBC
HCT: 39.6 % (ref 39.0–52.0)
Hemoglobin: 13.9 g/dL (ref 13.0–17.0)
MCHC: 35 g/dL (ref 30.0–36.0)
MCV: 88.1 fL (ref 78.0–100.0)
Platelets: 175 10*3/uL (ref 150–400)
RBC: 4.49 MIL/uL (ref 4.22–5.81)
RDW: 12.7 % (ref 11.5–15.5)
WBC: 6.9 10*3/uL (ref 4.0–10.5)

## 2010-06-09 LAB — POCT B-TYPE NATRIURETIC PEPTIDE (BNP): B Natriuretic Peptide, POC: 33.2 pg/mL (ref 0–100)

## 2010-06-09 LAB — POCT CARDIAC MARKERS
CKMB, poc: 1.3 ng/mL (ref 1.0–8.0)
CKMB, poc: 1.3 ng/mL (ref 1.0–8.0)
Myoglobin, poc: 51.7 ng/mL (ref 12–200)
Myoglobin, poc: 53.7 ng/mL (ref 12–200)
Troponin i, poc: <0.05 ng/mL (ref 0.00–0.09)
Troponin i, poc: <0.05 ng/mL (ref 0.00–0.09)

## 2010-06-09 LAB — URINALYSIS, ROUTINE W REFLEX MICROSCOPIC
Bilirubin Urine: NEGATIVE
Glucose, UA: NEGATIVE mg/dL
Hgb urine dipstick: NEGATIVE
Ketones, ur: 15 mg/dL — AB
Nitrite: NEGATIVE
Protein, ur: NEGATIVE mg/dL
Specific Gravity, Urine: 1.023 (ref 1.005–1.030)
Urobilinogen, UA: 0.2 mg/dL (ref 0.0–1.0)
pH: 5.5 (ref 5.0–8.0)

## 2010-06-09 LAB — GLUCOSE, CAPILLARY
Glucose-Capillary: 144 mg/dL — ABNORMAL HIGH (ref 70–99)
Glucose-Capillary: 99 mg/dL (ref 70–99)

## 2010-06-09 LAB — PROTIME-INR
INR: 1 (ref 0.00–1.49)
Prothrombin Time: 13.1 seconds (ref 11.6–15.2)

## 2010-06-09 LAB — LIPASE, BLOOD: Lipase: 53 U/L (ref 23–300)

## 2010-06-09 LAB — D-DIMER, QUANTITATIVE: D-Dimer, Quant: <0.22 ug/mL-FEU (ref 0.00–0.48)

## 2010-07-13 NOTE — H&P (Signed)
NAME:  [name], [name] NO.:  [id number]   MEDICAL RECORD NO.:  [id number]          PATIENT TYPE:  OIB   LOCATION:  NA                           FACILITY:  [facility]   PHYSICIAN:  Vesta Mixer, M.D. DATE OF BIRTH:  1951/02/23   DATE OF ADMISSION:  DATE OF DISCHARGE:                              HISTORY & PHYSICAL   Zachary Michael is a middle-aged gentleman with a history of chest  discomfort.  He has also had an abnormal stress test and is now admitted  for heart catheterization.   Zachary Michael is a middle-aged gentleman who was seen in the emergency room last  week for episodes of chest pain.  He was referred to our office for  further evaluation.  He has been complaining of chest pain since last  October.  There have been no real precipitating factors.  The episodes  last from between 20 minutes and 3 hours.  It is ranged as a 3 or 4/10.  The pain seems to radiate to his left shoulder and into his back.  There  is no dyspnea.  He does have some nausea with the chest pressure.  He  also describes a clammy skin sensation.  He does not get any regular  exercise but does do yard work.  He has not noticed any real episodes of  worsening pain with yard work.  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   He recently had a stress Cardiolite study which revealed ischemia in the  inferior base.  We asked him to come back for a pre-cath evaluation.   The patient has taken a couple nitroglycerin since I last saw him last  week.  He denies any syncope or presyncope.  He denies any heat or cold  intolerance, weight gain, or weight loss.  All other systems were  reviewed and are negative.   CURRENT MEDICATIONS:  1. Actos 45 mg a day.  2. Metformin 1000 mg p.o. b.i.d.  3. Zocor 80 mg a day.  4. Aspirin 325 mg a day.  5. Multivitamin once a day.  6. Vitamin D once a day.  7. Vitamin C once a day.  8. Ibuprofen as needed.  9. Nitroglycerin as needed.   ALLERGIES:  NONE.   PAST MEDICAL HISTORY:  1. Diabetes mellitus.  2. Childhood asthma.  3. Hypercholesterolemia.   FAMILY HISTORY:  His father died at age 19 due to lung cancer and  leukemia.  His mother died at age 71 due to respiratory failure.   SOCIAL HISTORY:  The patient used to smoke a pipe but quit 30 years ago.  He works in Biomedical engineer.  He drinks alcohol only rarely.  He drinks  iced tea through the day.   REVIEW OF SYSTEMS:  No weight gain or weight loss.  No heat or cold  intolerence. No PND.  Reviewed in the HPI.  All other systems are  negative.   EXAM:  He is a middle-aged gentleman in no acute distress.  He is alert  and oriented  x3 and his mood and affect are normal.  His weight is 251.  Blood pressure is 142/78 with a heart rate of 78.  HEENT EXAM:  Reveals 2+ carotids.  He has no bruits.  No JVD.  No  thyromegaly.  His neck is supple.  His sclerae are nonicteric.  His  mucous membranes are moist.  LUNGS:  Clear.  BACK:  Nontender.  HEART:  Regular rate.  S1-S2.  His PMI is nondisplaced.  He has no  murmurs.  ABDOMINAL EXAM:  Reveals good bowel sounds and is nontender.  EXTREMITIES:  He has no clubbing, cyanosis, or edema.  SKIN:  Warm and dry without rashes.  His gait is normal.  NEURO EXAM:  Reveals cranial nerves II-XII are intact and his motor and  sensory function were intact.   His EKG reveals normal sinus rhythm.  There are no ST or T-wave changes.   Zachary Michael presents with some episodes of chest discomfort.  He has a stress  Cardiolite study which reveals inferobasilar ischemia.  We discussed the  risks, benefits, and options of heart catheterization.  He understands  and agrees to proceed.  We will schedule it in the inpatient lab per his  request.  He is to call me right away if he has any worsening episodes  of chest discomfort.      Vesta Mixer, M.D.  Electronically Signed     PJN/MEDQ  D:  06/10/2008  T:  06/10/2008  Job:   [id number]   cc:   Lelon Perla, DO

## 2010-07-13 NOTE — Cardiovascular Report (Signed)
NAMEAQEEL, NORGAARD NO.:  [id number]   MEDICAL RECORD NO.:  [id number]          PATIENT TYPE:  OIB   LOCATION:  2899                         FACILITY:  [facility]   PHYSICIAN:  Vesta Mixer, M.D. DATE OF BIRTH:  16-Sep-1950   DATE OF PROCEDURE:  DATE OF DISCHARGE:  06/11/2008                            CARDIAC CATHETERIZATION   Zachary Michael is a 60 year old gentleman who with an episode of chest  pain.  He had a stress test which revealed attenuation of the inferior  base.  He also had some episodes of nonsustained VT on the treadmill.  He is scheduled for heart catheterization based on these findings.   The right femoral artery was easily cannulated using modified Seldinger  technique.   HEMODYNAMICS:  His LV pressure is 134/19.  His aortic pressure is  134/70.  There is no aortic valve gradient.   ANGIOGRAPHY:  Left main.  The left main is smooth and normal.   The left anterior descending artery is smooth and normal.  It gives off  2 fairly moderate-sized diagonal artery which are normal.  The third  diagonal artery is normal.   The ramus intermediate branch is large and is normal.   The left circumflex artery is normal.  There are perhaps some small very  minor luminal irregularities.  The first and second obtuse marginal  arteries are moderate in size and are normal.   The right coronary artery is large.  There are minor luminal  irregularities between 10 and 20%.  The posterior descending artery and  the posterolateral segment artery are normal.   The left ventriculogram was performed in the 30 RAO position.  It  reveals normal left ventricular systolic function.  The ejection  fraction is around 60%.   COMPLICATIONS:  None.   CONCLUSION:  Minor coronary artery irregularities, but certainly nothing  stenotic enough to be causing his symptoms or his VT.  We will continue  with medical therapy.  We will see him back in the office in a week or  two.      Vesta Mixer, M.D.  Electronically Signed     PJN/MEDQ  D:  06/11/2008  T:  06/12/2008  Job:  [id number]   cc:   Lelon Perla, DO

## 2010-08-11 ENCOUNTER — Other Ambulatory Visit: Payer: Self-pay | Admitting: Cardiovascular Disease

## 2010-08-11 ENCOUNTER — Other Ambulatory Visit: Payer: Self-pay | Admitting: Family Medicine

## 2010-08-11 NOTE — Telephone Encounter (Signed)
Pt needs to be scheduled for cpx and fasting labs before further refills pls call and schedule. Last office visit and labs 02/2009

## 2010-08-11 NOTE — Telephone Encounter (Signed)
MSG left home and cell, pt to call back to tell me if we are to refill or if he is having Dr Laury Axon or dr Nicole Kindred to do refills. He will need a yearly visit scheduled if filled here. Pt not seen since 2010. Our office number left in [facility].

## 2010-08-13 ENCOUNTER — Telehealth: Payer: Self-pay | Admitting: Cardiovascular Disease

## 2010-08-13 DIAGNOSIS — E785 Hyperlipidemia, unspecified: Secondary | ICD-10-CM

## 2010-08-13 MED ORDER — LISINOPRIL 10 MG PO TABS: 10.0000 mg | ORAL_TABLET | Freq: Every day | ORAL | Status: DC

## 2010-08-13 MED ORDER — SIMVASTATIN 40 MG PO TABS: 40.0000 mg | ORAL_TABLET | Freq: Every evening | ORAL | Status: DC

## 2010-08-13 MED ORDER — CARVEDILOL 6.25 MG PO TABS: 6.2500 mg | ORAL_TABLET | Freq: Two times a day (BID) | ORAL | Status: DC

## 2010-08-13 NOTE — Telephone Encounter (Signed)
PT WANTED TO SW JODETTE REGARDING A MED HE WAS TRYING TO FILL THRU [facility]. THEIR PHARMACIST INSTRUCTED HIM TO CALL HERE. JODETTE NOT AVAILABLE, TRIED TO TAKE A MESG, PT SAID HE WAS NO REACHABLE. ASK WHAT HE WOULD LIKE FOR ME TO DO SINCE I COULD GET THE RN ON THE PHONE AT THE MOMENT, HE REMARKED "NOTHING I GUESS". FORWARDING THIS INFO TO NURSE.

## 2010-08-13 NOTE — Telephone Encounter (Signed)
Ordered 90 days of meds, needs a yearly office visit with fasting labs. MS[facility] leftJodette Mason Jim RN

## 2010-09-23 ENCOUNTER — Encounter: Payer: Self-pay | Admitting: Family Medicine

## 2010-09-23 ENCOUNTER — Ambulatory Visit (INDEPENDENT_AMBULATORY_CARE_PROVIDER_SITE_OTHER): Payer: Managed Care, Other (non HMO) | Admitting: Family Medicine

## 2010-09-23 VITALS — BP 130/78 | HR 68 | Temp 97.1°F | Ht 70.0 in | Wt 259.4 lb

## 2010-09-23 DIAGNOSIS — I10 Essential (primary) hypertension: Secondary | ICD-10-CM

## 2010-09-23 DIAGNOSIS — J45909 Unspecified asthma, uncomplicated: Secondary | ICD-10-CM

## 2010-09-23 DIAGNOSIS — Z0000 Encounter for general adult medical examination without abnormal findings: Secondary | ICD-10-CM

## 2010-09-23 DIAGNOSIS — Z23 Encounter for immunization: Secondary | ICD-10-CM

## 2010-09-23 DIAGNOSIS — E119 Type 2 diabetes mellitus without complications: Secondary | ICD-10-CM

## 2010-09-23 LAB — CBC WITH DIFFERENTIAL/PLATELET
Basophils Absolute: 0.1 10*3/uL (ref 0.0–0.1)
Basophils Relative: 0.8 % (ref 0.0–3.0)
Eosinophils Absolute: 0.2 10*3/uL (ref 0.0–0.7)
Eosinophils Relative: 3.4 % (ref 0.0–5.0)
HCT: 41.7 % (ref 39.0–52.0)
Hemoglobin: 14.2 g/dL (ref 13.0–17.0)
Lymphocytes Relative: 22.8 % (ref 12.0–46.0)
Lymphs Abs: 1.6 10*3/uL (ref 0.7–4.0)
MCHC: 34.1 g/dL (ref 30.0–36.0)
MCV: 87.5 fl (ref 78.0–100.0)
Monocytes Absolute: 0.5 10*3/uL (ref 0.1–1.0)
Monocytes Relative: 7.1 % (ref 3.0–12.0)
Neutro Abs: 4.5 10*3/uL (ref 1.4–7.7)
Neutrophils Relative %: 65.9 % (ref 43.0–77.0)
Platelets: 223 10*3/uL (ref 150.0–400.0)
RBC: 4.77 Mil/uL (ref 4.22–5.81)
RDW: 12.5 % (ref 11.5–14.6)
WBC: 6.8 10*3/uL (ref 4.5–10.5)

## 2010-09-23 LAB — HEPATIC FUNCTION PANEL
ALT: 24 U/L (ref 0–53)
AST: 20 U/L (ref 0–37)
Albumin: 4.4 g/dL (ref 3.5–5.2)
Alkaline Phosphatase: 53 U/L (ref 39–117)
Bilirubin, Direct: 0.1 mg/dL (ref 0.0–0.3)
Total Bilirubin: 0.9 mg/dL (ref 0.3–1.2)
Total Protein: 7.2 g/dL (ref 6.0–8.3)

## 2010-09-23 LAB — MICROALBUMIN / CREATININE URINE RATIO
Creatinine,U: 106.9 mg/dL
Microalb Creat Ratio: 1.7 mg/g (ref 0.0–30.0)
Microalb, Ur: 1.8 mg/dL (ref 0.0–1.9)

## 2010-09-23 LAB — LIPID PANEL
Cholesterol: 175 mg/dL (ref 0–200)
HDL: 56.1 mg/dL (ref >39.00)
LDL Cholesterol: 90 mg/dL (ref 0–99)
Total CHOL/HDL Ratio: 3
Triglycerides: 146 mg/dL (ref 0.0–149.0)
VLDL: 29.2 mg/dL (ref 0.0–40.0)

## 2010-09-23 LAB — POCT URINALYSIS DIPSTICK
Bilirubin, UA: NEGATIVE
Blood, UA: NEGATIVE
Leukocytes, UA: NEGATIVE
Nitrite, UA: NEGATIVE
Protein, UA: NEGATIVE
Spec Grav, UA: 1.02
Urobilinogen, UA: 0.2
pH, UA: 5

## 2010-09-23 LAB — BASIC METABOLIC PANEL
BUN: 15 mg/dL (ref 6–23)
CO2: 25 mEq/L (ref 19–32)
Calcium: 9 mg/dL (ref 8.4–10.5)
Chloride: 98 mEq/L (ref 96–112)
Creatinine, Ser: 0.6 mg/dL (ref 0.4–1.5)
GFR: 161.06 mL/min (ref >60.00)
Glucose, Bld: 254 mg/dL — ABNORMAL HIGH (ref 70–99)
Potassium: 4 mEq/L (ref 3.5–5.1)
Sodium: 133 mEq/L — ABNORMAL LOW (ref 135–145)

## 2010-09-23 LAB — TSH: TSH: 0.8 u[IU]/mL (ref 0.35–5.50)

## 2010-09-23 LAB — HEMOGLOBIN A1C: Hgb A1c MFr Bld: 10.7 % — ABNORMAL HIGH (ref 4.6–6.5)

## 2010-09-23 LAB — PSA: PSA: 1.86 ng/mL (ref 0.10–4.00)

## 2010-09-23 MED ORDER — PIOGLITAZONE HCL 45 MG PO TABS: 45.0000 mg | ORAL_TABLET | Freq: Every day | ORAL | Status: DC

## 2010-09-23 MED ORDER — METFORMIN HCL 1000 MG PO TABS: 1000.0000 mg | ORAL_TABLET | Freq: Two times a day (BID) | ORAL | Status: DC

## 2010-09-23 NOTE — Assessment & Plan Note (Signed)
Check labs con't meds 

## 2010-09-23 NOTE — Progress Notes (Signed)
  Subjective:    Patient ID: Zachary Michael, male    DOB: 31-May-1950, 60 y.o.   MRN: [id number]  HPI Pt here for cpe and labs.   No complaints.    Review of Systems     Review of Systems  Constitutional: Negative for activity change, appetite change and fatigue.  HENT: Negative for hearing loss, congestion, tinnitus and ear discharge.  Dentist-- due Eyes: Negative for visual disturbance (see optho q1y -- vision corrected to 20/20 with glasses).  Respiratory: Negative for cough, chest tightness and shortness of breath.   Cardiovascular: Negative for chest pain, palpitations and leg swelling.  Gastrointestinal: Negative for abdominal pain, diarrhea, constipation and abdominal distention.  Genitourinary: Negative for urgency, frequency, decreased urine volume and difficulty urinating.  Musculoskeletal: Negative for back pain, arthralgias and gait problem.  Skin: Negative for color change, pallor and rash.  Neurological: Negative for dizziness, light-headedness, numbness and headaches.  Hematological: Negative for adenopathy. Does not bruise/bleed easily.  Psychiatric/Behavioral: Negative for suicidal ideas, confusion, sleep disturbance, self-injury, dysphoric mood, decreased concentration and agitation.      Objective:   Physical Exam    BP 130/78  Pulse 68  Temp(Src) 97.1 F (36.2 C) (Oral)  Ht 5\' 10"  (1.778 m)  Wt 259 lb 6.4 oz (117.663 kg)  BMI 37.22 kg/m2  SpO2 98%  General Appearance:    Alert, cooperative, no distress, appears stated age  Head:    Normocephalic, without obvious abnormality, atraumatic  Eyes:    PERRL, conjunctiva/corneas clear, EOM's intact, fundi    benign, both eyes       Ears:    Normal TM's and external ear canals, both ears  Nose:   Nares normal, septum midline, mucosa normal, no drainage   or sinus tenderness  Throat:   Lips, mucosa, and tongue normal; teeth and gums normal  Neck:   Supple, symmetrical, trachea midline, no adenopathy;       thyroid:   No enlargement/tenderness/nodules; no carotid   bruit or JVD  Back:     Symmetric, no curvature, ROM normal, no CVA tenderness  Lungs:     Clear to auscultation bilaterally, respirations unlabored  Chest wall:    No tenderness or deformity  Heart:    Regular rate and rhythm, S1 and S2 normal, no murmur, rub   or gallop  Abdomen:     Soft, non-tender, bowel sounds active all four quadrants,    no masses, no organomegaly  Genitalia:    Normal male without lesion, discharge or tenderness  Rectal:    Normal tone, normal prostate, no masses or tenderness;   guaiac negative stool  Extremities:   Extremities normal, atraumatic, no cyanosis or edema  Pulses:   2+ and symmetric all extremities  Skin:   Skin color, texture, turgor normal, no rashes or lesions  Lymph nodes:   Cervical, supraclavicular, and axillary nodes normal  Neurologic:   CNII-XII intact. Normal strength, sensation and reflexes      throughout      Assessment & Plan:

## 2010-09-23 NOTE — Assessment & Plan Note (Signed)
Stable

## 2010-09-23 NOTE — Patient Instructions (Signed)
Preventative Care for Adults, Male   MAINTAIN REGULAR HEALTH EXAMS   A routine yearly physical is a good way to check in with your primary care provider about your health and preventive screening. It is also an opportunity to share updates about your health and any concerns you have and receive a thorough all-over exam.   If you smoke or chew tobacco, find out from your caregiver how to quit. It can literally save your life, no matter how long you have been a tobacco user. If you do not use tobacco, never begin.   Maintain a healthy diet and normal weight. Increased weight leads to problems with blood pressure and diabetes. Decrease saturated fat in the diet and increase regular exercise. Get information about proper diet from your caregiver if necessary. Eat a variety of foods, including fruit, vegetables, animal or vegetable protein, such as meat, fish, chicken, and eggs, or beans, lentils, tofu, and grains, such as rice.   High blood pressure causes heart and blood vessel problems. Fat leaves deposits in your arteries that can block them. This causes heart disease and vessel disease elsewhere in your body. Check your blood pressure regularly and keep it within normal limits. Men over age 50 or those who have a family history of high blood pressure should have it checked at least every year.   Aerobic exercise helps maintain good heart health. 30 minutes of moderate-intensity exercise is recommended. For example, a brisk walk that increases your heart rate and breathing. This walk should be done on most days of the week. Persistent high blood pressure should be treated with medicine if weight loss and exercise do not work.   For many men aged 20 and older, having a cholesterol test of the blood every 5 years is recommended. If your cholesterol is found to be borderline high, or if you have heart disease or certain other medical conditions, then you may need to have it monitored more frequently.   Avoid smoking,  drinking alcohol in excess (more than two drinks per day) or use of street drugs. Do not share needles with anyone. Ask for professional help if you need assistance or instructions on stopping the use of alcohol, cigarettes, and/or drugs.   Maintain normal blood lipids and cholesterol by minimizing your intake of saturated fat. Eat a well rounded diet otherwise, with plenty of fruit and vegetables. The National Institutes of Health encourage men to eat 5-9 servings of fruit and vegetables each day. Your caregiver can help you keep your risk of heart disease or stroke at a lower level.   Ask your caregiver if you are in need of earlier testing because of: a strong family history of heart disease, or you have signs of elevated testosterone (male sex hormone) levels. This can predispose you to earlier heart disease. Ask if you should have a stress test if your history suggests this. A stress test is a test done on a treadmill that looks for heart disease. This test can find disease prior to there being a problem.   Diabetes screening assesses your blood sugar level after a fasting once every 3 years after age 45 if previous tests were normal.   Most routine colon cancer screening begins at the age of 50. On a yearly basis, doctors may provide special easy to use take-home tests to check for hidden blood in the stool. Sigmoidoscopy or colonoscopy can detect the earliest forms of colon cancer and is life saving. These test use a   small camera at the end of a tube to directly examine the colon. Speak to your caregiver about this at age 50, when routine screening begins (and is repeated every 5 years unless early forms of pre-cancerous polyps or small growths are found).   At the age of 50 men usually start screening for prostate cancer every year. Screening may begin at a younger age for those with higher risk. Those at higher risk include African-Americans or having a family history of prostate cancer. There are two types  of tests for prostate cancer:   Prostate-specific antigen (PSA) testing. Recent studies raise questions about prostate cancer using PSA and you should discuss this with your caregiver.   Digital rectal exam (in which your doctor’s lubricated and gloved finger feels for enlargement of the prostate through the anus).   Practice safe sex. Use condoms. Condoms are used for birth control and to help reduce the spread of sexually transmitted infections (or STIs). Unsafe sex is having an unprotected physical relationship with someone who is bisexual, homosexual, uses intravenous street drugs, or going with someone who has sexual relations with high-risk groups. Practicing safe sex helps you avoid getting an STI. Some of the STIs are gonorrhea (the clap), chlamydia, syphilis, trichimonas, herpes, HPV (human papiloma virus) and HIV (human immunodeficiency virus) which causes AIDS. The herpes, HIV and HPV are viral illnesses that have no cure. These can result in disability, cancer and death.   It is not safe for someone who has AIDS or is HIV positive to have unprotected sex with someone else who is positive. The reason for this is the fact that there are many different strains of HIV. If you have a strain that is readily treated with medications and then suddenly introduce a strain from a partner that has no further treatment options, you may suddenly have a strain of HIV that is untreatable. Even if you are both positive for HIV, it is still necessary to practice safe sex.   Use sunscreen with a SPF (or skin protection factor) of 15 or greater. Apply sunscreen liberally and repeatedly throughout the day. Being outside in the sun when your shadow caused by the sun is shorter than you are, means you are being exposed to sun at greater intensity. Lighter skinned people are at a greater risk of skin cancer. Don’t forget to also wear sunglasses in order to protect your eyes from too much damaging sunlight. Damaging sunlight can  accelerate cataract formation.   Once a month do a whole body skin exam or review, using a mirror to look at your back. Notify your caregivers of changes in moles, especially if there are changes in shapes, colors, a size larger than a pencil eraser, an irregular border, or development of new moles.   Keep carbon monoxide and smoke detectors in your home functioning at all times. Change the batteries every 6 months or use a model that plugs into the wall.   Do a monthly exam of your testicles. Gently roll each testicle between your thumb and fingers, feeling for any abnormal lumps. The best time to do this is after a hot shower or bath when the tissues are looser. Notify your caregivers of any lumps, tenderness or changes in size or shape immediately.   Stay up to date with your tetanus shots and other required immunizations. You should have a booster for tetanus every 10 years. Be sure to get your flu shot every year, since 5%-20% of the [place] population   comes down with the flu. The flu vaccine changes each year, so being vaccinated once is not enough. Get your shot in the fall, before the flu season peaks. The table below lists important vaccines to get. Other vaccines to consider include:   Hepatitis A virus (to prevent a form of infection of the liver by a virus acquired from food), Varicella Zoster (a virus that causes shingles).   Meningococcal (against bacteria which cause a form of meningitis).   Brush your teeth twice a day with fluoride toothpaste, and floss once a day. Good oral hygiene prevents tooth decay and gum disease. The problems can be painful, unattractive, and can cause other health problems. Visit your dentist for a routine oral and dental check up and preventive care every 6-12 months.   The Body Mass Index or BMI is a way of measuring how much of your body is fat. Having a BMI above 27 increases the risk of heart disease, diabetes, hypertension, stroke and other problems related to obesity.  Your caregiver can help determine your BMI and based on it develop an exercise and dietary program to help you achieve or maintain this important measurement at a healthful level.   Wear seat belts whenever in a vehicle, whether a passenger or driver, and even for very short drives of a few minutes.   If you bicycle, wear a helmet at all times.   Below is a summary of the most important preventative healthcare services that adult males should seek on a regular basis throughout their lives:   Preventative Care for Adult Males    Preventative Service  Ages 19-39  Ages 40-64  Ages 65 and over    Schedule of medical visits  Every 5 years  Every 5 years     Schedule of dental visits  Every 6-12 months  Every 6-12 months     Health risk assessment and lifestyle counseling  Every 3-5 years  Every 3-5 years  Every 3-5 years    Blood pressure check**  Every 2 years  Every 2 years  Every 2 years    Total cholesterol check including HDL**  Every 5 years beginning at age 35  Every 5 years  Every 5 years through age 75, then optional.    Flexible sigmoidoscopy or colonoscopy**   Every 5 years beginning at age 50  Every 5 years through age 80, then optional.    Prostate screening   Every year beginning at age 50  Every Year    Testicular exam  Monthly  Monthly  Monthly    FOBT (fecal occult blood test)   Every year beginning at age 50  Every year until 80, then optional.    Skin self-exam  Monthly  Monthly  Monthly    Tetanus-diphtheria (Td) immunization  Every 10 years  Every 10 years  Every 10 years    Influenza immunization**  Every year  Every year  Every year    Pneumococcal immunization**  Optional  Optional  Every 5 years    Hepatitis B immunization**  Series of 3 immunizations   (if not done previously, usually given at 0, 1 to 2, and 4 to 6 months)  Check with your caregiver if vaccination not previously given.  Check with your caregiver if vaccination not previously given.    **Family history and personal history of  risk and conditions may change your physician's recommendations.    Document Released: 04/12/2001 Document Re-Released: 05/11/2009   [employer]® Patient Information ©  2011 [employer].

## 2010-09-23 NOTE — Assessment & Plan Note (Signed)
ghm utd Check labs  

## 2010-09-24 ENCOUNTER — Telehealth: Payer: Self-pay

## 2010-09-24 NOTE — Telephone Encounter (Signed)
I called patient to offer appointment to discuss labs home number mailbox was full (unable to leave message), work/other number was no longer a working number. I will try to contact patient later

## 2010-09-24 NOTE — Telephone Encounter (Signed)
Message copied by Edgardo Roys on Fri Sep 24, 2010  4:06 PM ------      Message from: Lelon Perla      Created: Fri Sep 24, 2010  2:47 PM       Return to ov to discuss changing DM meds

## 2010-09-27 NOTE — Telephone Encounter (Signed)
I called patient again, mailbox full. I will try to reach patient again later (mailed copy of labs)

## 2010-09-28 NOTE — Telephone Encounter (Signed)
treid to call pt, VM box full.

## 2010-09-29 NOTE — Telephone Encounter (Signed)
Mailbox still full---Letter mailed to contact the office     KP

## 2010-10-28 ENCOUNTER — Ambulatory Visit: Payer: Self-pay | Admitting: Family Medicine

## 2010-10-28 DIAGNOSIS — Z029 Encounter for administrative examinations, unspecified: Secondary | ICD-10-CM

## 2010-12-02 ENCOUNTER — Other Ambulatory Visit: Payer: Self-pay | Admitting: Cardiovascular Disease

## 2010-12-02 ENCOUNTER — Encounter: Payer: Self-pay | Admitting: Family Medicine

## 2010-12-02 ENCOUNTER — Ambulatory Visit (INDEPENDENT_AMBULATORY_CARE_PROVIDER_SITE_OTHER): Payer: Managed Care, Other (non HMO) | Admitting: Family Medicine

## 2010-12-02 VITALS — BP 130/76 | HR 70 | Temp 98.6°F | Wt 266.2 lb

## 2010-12-02 DIAGNOSIS — L918 Other hypertrophic disorders of the skin: Secondary | ICD-10-CM

## 2010-12-02 DIAGNOSIS — L909 Atrophic disorder of skin, unspecified: Secondary | ICD-10-CM

## 2010-12-02 NOTE — Progress Notes (Signed)
  Subjective:    Patient ID: Zachary Michael, male    DOB: 28-Jun-1950, 60 y.o.   MRN: [id number]  HPI S: The patient complains of symptomatic skin tags on the back and back of neck. These are irritated by clothing, jewelry and rubbing.  O: Patient appears well. 2 benign skin tags are noted on the mid back and back of neck.   A: Skin tags   P: Skin tags are shaved off using Betadine for cleansing and scalpel secondary to one on back being quite large. Local anesthesia was used--1 % xyo with epi. These lesions weresent for pathology. Review of Systems     Objective:   Physical Examas above       Assessment & Plan:  As above

## 2010-12-02 NOTE — Patient Instructions (Signed)
Place skin tag removal patient instructions here.---  Keep area dry for 24 h Call if any problems

## 2010-12-25 ENCOUNTER — Other Ambulatory Visit: Payer: Self-pay | Admitting: Cardiovascular Disease

## 2011-01-04 ENCOUNTER — Encounter: Payer: Self-pay | Admitting: *Deleted

## 2011-01-06 ENCOUNTER — Ambulatory Visit (INDEPENDENT_AMBULATORY_CARE_PROVIDER_SITE_OTHER): Payer: Managed Care, Other (non HMO) | Admitting: Cardiovascular Disease

## 2011-01-06 ENCOUNTER — Encounter: Payer: Self-pay | Admitting: Cardiovascular Disease

## 2011-01-06 DIAGNOSIS — I10 Essential (primary) hypertension: Secondary | ICD-10-CM

## 2011-01-06 DIAGNOSIS — E785 Hyperlipidemia, unspecified: Secondary | ICD-10-CM

## 2011-01-06 DIAGNOSIS — E1169 Type 2 diabetes mellitus with other specified complication: Secondary | ICD-10-CM | POA: Insufficient documentation

## 2011-01-06 NOTE — Assessment & Plan Note (Signed)
His blood pressure is well controlled. His readings have been good as long as he takes his medications.  He occasionally runs out of his medications. At those times he has lots of palpitations and his blood pressure goes up.  I have encouraged  him to continue his daily medications. I'll see him again in one year.

## 2011-01-06 NOTE — Progress Notes (Signed)
Zachary Michael Date of Birth  [birth year] [facility] [address]    [address] [place], [place]  [address] [phone]  Fax  [phone]  History of Present Illness:  Zachary Michael is a 60 year old gentleman with a history of hypertension, hyperlipidemia, mild coronary artery disease, and diabetes mellitus.  He's done well since I last saw him. He's not had any episodes of chest pain or shortness of breath.  Current Outpatient Prescriptions on File Prior to Visit  Medication Sig Dispense Refill  . carvedilol (COREG) 6.25 MG tablet TAKE 1 TABLET TWICE A DAY  180 tablet  3  . ezetimibe (ZETIA) 10 MG tablet Take 10 mg by mouth daily.        [name] lisinopril (PRINIVIL,ZESTRIL) 10 MG tablet TAKE 1 TABLET DAILY  90 tablet  3  . metFORMIN (GLUCOPHAGE) 1000 MG tablet Take 1 tablet (1,000 mg total) by mouth 2 (two) times daily with a meal.  180 tablet  3  . NITROSTAT 0.4 MG SL tablet DISSOLVE 1 TABLET UNDER THE TONGUE AS NEEDED FOR CHEST PAIN AS DIRECTED  25 tablet  11  . pioglitazone (ACTOS) 45 MG tablet Take 1 tablet (45 mg total) by mouth daily.  90 tablet  3  . simvastatin (ZOCOR) 40 MG tablet Take 1 tablet (40 mg total) by mouth every evening.  90 tablet  0    No Known Allergies  Past Medical History  Diagnosis Date  . Asthma   . Diabetes mellitus   . HTN (hypertension)   . CAD (coronary artery disease)     Mild  . Hyperlipidemia     Past Surgical History  Procedure Date  . Appendectomy   . Tonsillectomy   . Tumor removal     LEFT ARM  . Cardiac catheterization     We recently performed a which revealed minor coronary artery irregularities. He had normal left ventricle systolic function with an EF of 60%  . Cardiovascular stress test     EF 63%    History  Smoking status  . Never Smoker   Smokeless tobacco  . Never Used    History  Alcohol Use No    Family History  Problem Relation Age of Onset  . Lung cancer    . Pneumonia Mother   . Osteoporosis Mother   .  Arthritis Mother   . Diabetes Mother   . Hypertension Mother   . Cancer Father     leukemia, lung  . Cancer Brother   . Diabetes Maternal Aunt   . Hypertension Maternal Aunt   . Hypertension Maternal Aunt     Reviw of Systems:  Reviewed in the HPI.  All other systems are negative.  Physical Exam: BP 115/78  Pulse 69  Ht 5\' 10"  (1.778 m)  Wt 264 lb (119.75 kg)  BMI 37.88 kg/m2 The patient is alert and oriented x 3.  The mood and affect are normal.   Skin: warm and dry.  Color is normal.    HEENT:   [place]/AT, no JVD, normal carotids  Lungs: clear    Heart: RR, no murmurs    Abdomen: soft, + BS, non tender, mildly obese  Extremities:  No c/c/e  Neuro:  Non focal, CN intact, gait is normal     Assessment / Plan:

## 2011-01-06 NOTE — Patient Instructions (Signed)
Your physician wants you to follow-up in: 1 year  You will receive a reminder letter in the mail two months in advance. If you don't receive a letter, please call our office to schedule the follow-up appointment.  Your physician recommends that you continue on your current medications as directed. Please refer to the Current Medication list given to you today.  

## 2011-03-05 ENCOUNTER — Other Ambulatory Visit: Payer: Self-pay | Admitting: Cardiovascular Disease

## 2011-03-07 NOTE — Telephone Encounter (Signed)
Fax Received. Refill Completed. Zachary Michael (R.M.A)   

## 2011-07-28 ENCOUNTER — Encounter: Payer: Self-pay | Admitting: Family Medicine

## 2011-08-05 ENCOUNTER — Encounter: Payer: Self-pay | Admitting: Cardiovascular Disease

## 2011-09-06 ENCOUNTER — Other Ambulatory Visit: Payer: Self-pay | Admitting: Family Medicine

## 2011-09-06 NOTE — Telephone Encounter (Signed)
Pending apt 09/27/11    KP

## 2011-09-27 ENCOUNTER — Ambulatory Visit (INDEPENDENT_AMBULATORY_CARE_PROVIDER_SITE_OTHER): Payer: 59 | Admitting: Family Medicine

## 2011-09-27 ENCOUNTER — Encounter: Payer: Self-pay | Admitting: Family Medicine

## 2011-09-27 VITALS — BP 116/80 | HR 72 | Temp 98.2°F | Ht 70.0 in | Wt 272.2 lb

## 2011-09-27 DIAGNOSIS — Z0000 Encounter for general adult medical examination without abnormal findings: Secondary | ICD-10-CM

## 2011-09-27 DIAGNOSIS — I10 Essential (primary) hypertension: Secondary | ICD-10-CM

## 2011-09-27 DIAGNOSIS — J45909 Unspecified asthma, uncomplicated: Secondary | ICD-10-CM

## 2011-09-27 DIAGNOSIS — E785 Hyperlipidemia, unspecified: Secondary | ICD-10-CM

## 2011-09-27 DIAGNOSIS — E119 Type 2 diabetes mellitus without complications: Secondary | ICD-10-CM

## 2011-09-27 LAB — MICROALBUMIN / CREATININE URINE RATIO
Creatinine,U: 129.5 mg/dL
Microalb Creat Ratio: 1.5 mg/g (ref 0.0–30.0)
Microalb, Ur: 2 mg/dL — ABNORMAL HIGH (ref 0.0–1.9)

## 2011-09-27 LAB — POCT URINALYSIS DIPSTICK
Bilirubin, UA: NEGATIVE
Blood, UA: NEGATIVE
Glucose, UA: NEGATIVE
Ketones, UA: NEGATIVE
Leukocytes, UA: NEGATIVE
Nitrite, UA: NEGATIVE
Protein, UA: NEGATIVE
Spec Grav, UA: >=1.03
Urobilinogen, UA: 0.2
pH, UA: 6

## 2011-09-27 LAB — PSA: PSA: 2.05 ng/mL (ref 0.10–4.00)

## 2011-09-27 LAB — BASIC METABOLIC PANEL
BUN: 17 mg/dL (ref 6–23)
CO2: 26 mEq/L (ref 19–32)
Calcium: 9.8 mg/dL (ref 8.4–10.5)
Chloride: 103 mEq/L (ref 96–112)
Creatinine, Ser: 0.7 mg/dL (ref 0.4–1.5)
GFR: 123.56 mL/min (ref >60.00)
Glucose, Bld: 163 mg/dL — ABNORMAL HIGH (ref 70–99)
Potassium: 4.5 mEq/L (ref 3.5–5.1)
Sodium: 139 mEq/L (ref 135–145)

## 2011-09-27 LAB — LIPID PANEL
Cholesterol: 178 mg/dL (ref 0–200)
HDL: 65.2 mg/dL (ref >39.00)
LDL Cholesterol: 91 mg/dL (ref 0–99)
Total CHOL/HDL Ratio: 3
Triglycerides: 108 mg/dL (ref 0.0–149.0)
VLDL: 21.6 mg/dL (ref 0.0–40.0)

## 2011-09-27 LAB — TSH: TSH: 0.98 u[IU]/mL (ref 0.35–5.50)

## 2011-09-27 LAB — CBC WITH DIFFERENTIAL/PLATELET
Basophils Absolute: 0.1 10*3/uL (ref 0.0–0.1)
Basophils Relative: 0.8 % (ref 0.0–3.0)
Eosinophils Absolute: 0.4 10*3/uL (ref 0.0–0.7)
Eosinophils Relative: 6 % — ABNORMAL HIGH (ref 0.0–5.0)
HCT: 39.6 % (ref 39.0–52.0)
Hemoglobin: 13.4 g/dL (ref 13.0–17.0)
Lymphocytes Relative: 21.8 % (ref 12.0–46.0)
Lymphs Abs: 1.4 10*3/uL (ref 0.7–4.0)
MCHC: 33.8 g/dL (ref 30.0–36.0)
MCV: 89.2 fl (ref 78.0–100.0)
Monocytes Absolute: 0.5 10*3/uL (ref 0.1–1.0)
Monocytes Relative: 7.6 % (ref 3.0–12.0)
Neutro Abs: 4.2 10*3/uL (ref 1.4–7.7)
Neutrophils Relative %: 63.8 % (ref 43.0–77.0)
Platelets: 200 10*3/uL (ref 150.0–400.0)
RBC: 4.44 Mil/uL (ref 4.22–5.81)
RDW: 13.2 % (ref 11.5–14.6)
WBC: 6.6 10*3/uL (ref 4.5–10.5)

## 2011-09-27 LAB — HEPATIC FUNCTION PANEL
ALT: 18 U/L (ref 0–53)
AST: 19 U/L (ref 0–37)
Albumin: 4.1 g/dL (ref 3.5–5.2)
Alkaline Phosphatase: 38 U/L — ABNORMAL LOW (ref 39–117)
Bilirubin, Direct: 0.1 mg/dL (ref 0.0–0.3)
Total Bilirubin: 0.4 mg/dL (ref 0.3–1.2)
Total Protein: 6.9 g/dL (ref 6.0–8.3)

## 2011-09-27 LAB — T4, FREE: Free T4: 1.04 ng/dL (ref 0.60–1.60)

## 2011-09-27 LAB — HEMOGLOBIN A1C: Hgb A1c MFr Bld: 7.1 % — ABNORMAL HIGH (ref 4.6–6.5)

## 2011-09-27 LAB — T3, FREE: T3, Free: 2.9 pg/mL (ref 2.3–4.2)

## 2011-09-27 NOTE — Assessment & Plan Note (Signed)
Check labs con't meds 

## 2011-09-27 NOTE — Assessment & Plan Note (Signed)
Stable con't meds 

## 2011-09-27 NOTE — Progress Notes (Signed)
Subjective:    Patient ID: Zachary Michael, male    DOB: February 16, 1951, 61 y.o.   MRN: [id number]  HPI Pt here for cpe and labs.  No complaints.     Review of Systems    Review of Systems  Constitutional: Negative for activity change, appetite change and fatigue.  HENT: Negative for hearing loss, congestion, tinnitus and ear discharge.  dentist--- no Eyes: Negative for visual disturbance (see optho q1y -- vision corrected to 20/20 with glasses).  Respiratory: Negative for cough, chest tightness and shortness of breath.   Cardiovascular: Negative for chest pain, palpitations and leg swelling.  Gastrointestinal: Negative for abdominal pain, diarrhea, constipation and abdominal distention.  Genitourinary: Negative for urgency, frequency, decreased urine volume and difficulty urinating.  Musculoskeletal: Negative for back pain, arthralgias and gait problem.  Skin: Negative for color change, pallor and rash.  Neurological: Negative for dizziness, light-headedness, numbness and headaches.  Hematological: Negative for adenopathy. Does not bruise/bleed easily.  Psychiatric/Behavioral: Negative for suicidal ideas, confusion, sleep disturbance, self-injury, dysphoric mood, decreased concentration and agitation.   Cards-- nasher annually-- 12/2010  Past Medical History  Diagnosis Date  . Asthma   . Diabetes mellitus   . HTN (hypertension)   . CAD (coronary artery disease)     Mild  . Hyperlipidemia    History   Social History  . Marital Status: Married    Spouse Name: N/A    Number of Children: 3  . Years of Education: N/A   Occupational History  . [employer]    Social History Main Topics  . Smoking status: Never Smoker   . Smokeless tobacco: Never Used  . Alcohol Use: No  . Drug Use: No  . Sexually Active: Yes -- Male partner(s)   Other Topics Concern  . Not on file   Social History Narrative   Exercise-- walking at work   Current Outpatient Prescriptions on File  Prior to Visit  Medication Sig Dispense Refill  . aspirin 81 MG tablet Take 81 mg by mouth daily.        . carvedilol (COREG) 6.25 MG tablet TAKE 1 TABLET TWICE A DAY  180 tablet  3  . ezetimibe (ZETIA) 10 MG tablet Take 10 mg by mouth daily.        [name] lisinopril (PRINIVIL,ZESTRIL) 10 MG tablet TAKE 1 TABLET DAILY  90 tablet  3  . metFORMIN (GLUCOPHAGE) 1000 MG tablet TAKE 1 TABLET TWICE A DAY WITH MEALS  180 tablet  0  . NITROSTAT 0.4 MG SL tablet DISSOLVE 1 TABLET UNDER THE TONGUE AS NEEDED FOR CHEST PAIN AS DIRECTED  25 tablet  11  . pioglitazone (ACTOS) 45 MG tablet TAKE 1 TABLET DAILY  90 tablet  0  . simvastatin (ZOCOR) 40 MG tablet TAKE 1 TABLET AT BEDTIME  90 tablet  3   Family History  Problem Relation Age of Onset  . Lung cancer    . Pneumonia Mother   . Osteoporosis Mother   . Arthritis Mother   . Diabetes Mother   . Hypertension Mother   . Cancer Father     leukemia, lung  . Cancer Brother   . Diabetes Maternal Aunt   . Hypertension Maternal Aunt   . Hypertension Maternal Aunt      Objective:   Physical Exam   BP 116/80  Pulse 72  Temp 98.2 F (36.8 C) (Oral)  Ht 5\' 10"  (1.778 m)  Wt 272 lb 3.2 oz (123.469 kg)  BMI  39.06 kg/m2  SpO2 96%  General Appearance:    Alert, cooperative, no distress, appears stated age  Head:    Normocephalic, without obvious abnormality, atraumatic  Eyes:    PERRL, conjunctiva/corneas clear, EOM's intact, fundi    benign, both eyes       Ears:    Normal TM's and external ear canals, both ears  Nose:   Nares normal, septum midline, mucosa normal, no drainage   or sinus tenderness  Throat:   Lips, mucosa, and tongue normal; teeth and gums normal  Neck:   Supple, symmetrical, trachea midline, no adenopathy;       thyroid:  No enlargement/tenderness/nodules; no carotid   bruit or JVD  Back:     Symmetric, no curvature, ROM normal, no CVA tenderness  Lungs:     Clear to auscultation bilaterally, respirations unlabored  Chest wall:     No tenderness or deformity  Heart:    Regular rate and rhythm, S1 and S2 normal, no murmur, rub   or gallop  Abdomen:     Soft, non-tender, bowel sounds active all four quadrants,    no masses, no organomegaly  Genitalia:    Normal male without lesion, discharge or tenderness  Rectal:    Normal tone, normal prostate, no masses or tenderness;   guaiac negative stool  Extremities:   Extremities normal, atraumatic, no cyanosis or edema  Pulses:   2+ and symmetric all extremities  Skin:   Skin color, texture, turgor normal, no rashes or lesions  Lymph nodes:   Cervical, supraclavicular, and axillary nodes normal  Neurologic:   CNII-XII intact. Normal strength, sensation and reflexes      throughout                      Sensory exam of the foot is normal, tested with the monofilament. Good pulses, no lesions or ulcers, good peripheral pulses.     Assessment & Plan:  cpe-- check labs           ghm utd

## 2011-09-27 NOTE — Patient Instructions (Addendum)
Preventive Care for Adults, Male A healthy lifestyle and preventative care can promote health and wellness. Preventative health guidelines for men include the following key practices:  A routine yearly physical is a good way to check with your caregiver about your health and preventative screening. It is a chance to share any concerns and updates on your health, and to receive a thorough exam.   Visit your dentist for a routine exam and preventative care every 6 months. Brush your teeth twice a day and floss once a day. Good oral hygiene prevents tooth decay and gum disease.   The frequency of eye exams is based on your age, health, family medical history, use of contact lenses, and other factors. Follow your caregiver's recommendations for frequency of eye exams.   Eat a healthy diet. Foods like vegetables, fruits, whole grains, low-fat dairy products, and lean protein foods contain the nutrients you need without too many calories. Decrease your intake of foods high in solid fats, added sugars, and salt. Eat the right amount of calories for you.Get information about a proper diet from your caregiver, if necessary.   Regular physical exercise is one of the most important things you can do for your health. Most adults should get at least 150 minutes of moderate-intensity exercise (any activity that increases your heart rate and causes you to sweat) each week. In addition, most adults need muscle-strengthening exercises on 2 or more days a week.   Maintain a healthy weight. The body mass index (BMI) is a screening tool to identify possible weight problems. It provides an estimate of body fat based on height and weight. Your caregiver can help determine your BMI, and can help you achieve or maintain a healthy weight.For adults 20 years and older:   A BMI below 18.5 is considered underweight.   A BMI of 18.5 to 24.9 is normal.   A BMI of 25 to 29.9 is considered overweight.   A BMI of 30 and above  is considered obese.   Maintain normal blood lipids and cholesterol levels by exercising and minimizing your intake of saturated fat. Eat a balanced diet with plenty of fruit and vegetables. Blood tests for lipids and cholesterol should begin at age 20 and be repeated every 5 years. If your lipid or cholesterol levels are high, you are over 50, or you are a high risk for heart disease, you may need your cholesterol levels checked more frequently.Ongoing high lipid and cholesterol levels should be treated with medicines if diet and exercise are not effective.   If you smoke, find out from your caregiver how to quit. If you do not use tobacco, do not start.   If you choose to drink alcohol, do not exceed 2 drinks per day. One drink is considered to be 12 ounces (355 mL) of beer, 5 ounces (148 mL) of wine, or 1.5 ounces (44 mL) of liquor.   Avoid use of street drugs. Do not share needles with anyone. Ask for help if you need support or instructions about stopping the use of drugs.   High blood pressure causes heart disease and increases the risk of stroke. Your blood pressure should be checked at least every 1 to 2 years. Ongoing high blood pressure should be treated with medicines, if weight loss and exercise are not effective.   If you are 45 to 62 years old, ask your caregiver if you should take aspirin to prevent heart disease.   Diabetes screening involves taking a blood   sample to check your fasting blood sugar level. This should be done once every 3 years, after age 45, if you are within normal weight and without risk factors for diabetes. Testing should be considered at a younger age or be carried out more frequently if you are overweight and have at least 1 risk factor for diabetes.   Colorectal cancer can be detected and often prevented. Most routine colorectal cancer screening begins at the age of 50 and continues through age 75. However, your caregiver may recommend screening at an earlier  age if you have risk factors for colon cancer. On a yearly basis, your caregiver may provide home test kits to check for hidden blood in the stool. Use of a small camera at the end of a tube, to directly examine the colon (sigmoidoscopy or colonoscopy), can detect the earliest forms of colorectal cancer. Talk to your caregiver about this at age 50, when routine screening begins. Direct examination of the colon should be repeated every 5 to 10 years through age 75, unless early forms of pre-cancerous polyps or small growths are found.   Hepatitis C blood testing is recommended for all people born from 1945 through 1965 and any individual with known risks for hepatitis C.   Practice safe sex. Use condoms and avoid high-risk sexual practices to reduce the spread of sexually transmitted infections (STIs). STIs include gonorrhea, chlamydia, syphilis, trichomonas, herpes, HPV, and human immunodeficiency virus (HIV). Herpes, HIV, and HPV are viral illnesses that have no cure. They can result in disability, cancer, and death.   A one-time screening for abdominal aortic aneurysm (AAA) and surgical repair of large AAAs by sound wave imaging (ultrasonography) is recommended for ages 65 to 75 years who are current or former smokers.   Healthy men should no longer receive prostate-specific antigen (PSA) blood tests as part of routine cancer screening. Consult with your caregiver about prostate cancer screening.   Testicular cancer screening is not recommended for adult males who have no symptoms. Screening includes self-exam, caregiver exam, and other screening tests. Consult with your caregiver about any symptoms you have or any concerns you have about testicular cancer.   Use sunscreen with skin protection factor (SPF) of 30 or more. Apply sunscreen liberally and repeatedly throughout the day. You should seek shade when your shadow is shorter than you. Protect yourself by wearing long sleeves, pants, a  wide-brimmed hat, and sunglasses year round, whenever you are outdoors.   Once a month, do a whole body skin exam, using a mirror to look at the skin on your back. Notify your caregiver of new moles, moles that have irregular borders, moles that are larger than a pencil eraser, or moles that have changed in shape or color.   Stay current with required immunizations.   Influenza. You need a dose every fall (or winter). The composition of the flu vaccine changes each year, so being vaccinated once is not enough.   Pneumococcal polysaccharide. You need 1 to 2 doses if you smoke cigarettes or if you have certain chronic medical conditions. You need 1 dose at age 65 (or older) if you have never been vaccinated.   Tetanus, diphtheria, pertussis (Tdap, Td). Get 1 dose of Tdap vaccine if you are younger than age 65 years, are over 65 and have contact with an infant, are a healthcare worker, or simply want to be protected from whooping cough. After that, you need a Td booster dose every 10 years. Consult your caregiver if   you have not had at least 3 tetanus and diphtheria-containing shots sometime in your life or have a deep or dirty wound.   HPV. This vaccine is recommended for males 13 through 62 years of age. This vaccine may be given to men 22 through 62 years of age who have not completed the 3 dose series. It is recommended for men through age 26 who have sex with men or whose immune system is weakened because of HIV infection, other illness, or medications. The vaccine is given in 3 doses over 6 months.   Measles, mumps, rubella (MMR). You need at least 1 dose of MMR if you were born in 1957 or later. You may also need a 2nd dose.   Meningococcal. If you are age 19 to 21 years and a first-year college student living in a residence hall, or have one of several medical conditions, you need to get vaccinated against meningococcal disease. You may also need additional booster doses.   Zoster (shingles).  If you are age 60 years or older, you should get this vaccine.   Varicella (chickenpox). If you have never had chickenpox or you were vaccinated but received only 1 dose, talk to your caregiver to find out if you need this vaccine.   Hepatitis A. You need this vaccine if you have a specific risk factor for hepatitis A virus infection, or you simply wish to be protected from this disease. The vaccine is usually given as 2 doses, 6 to 18 months apart.   Hepatitis B. You need this vaccine if you have a specific risk factor for hepatitis B virus infection or you simply wish to be protected from this disease. The vaccine is given in 3 doses, usually over 6 months.  Preventative Service / Frequency Ages 19 to 39  Blood pressure check.** / Every 1 to 2 years.   Lipid and cholesterol check.** / Every 5 years beginning at age 20.   Hepatitis C blood test.** / For any individual with known risks for hepatitis C.   Skin self-exam. / Monthly.   Influenza immunization.** / Every year.   Pneumococcal polysaccharide immunization.** / 1 to 2 doses if you smoke cigarettes or if you have certain chronic medical conditions.   Tetanus, diphtheria, pertussis (Tdap,Td) immunization. / A one-time dose of Tdap vaccine. After that, you need a Td booster dose every 10 years.   HPV immunization. / 3 doses over 6 months, if 26 and younger.   Measles, mumps, rubella (MMR) immunization. / You need at least 1 dose of MMR if you were born in 1957 or later. You may also need a 2nd dose.   Meningococcal immunization. / 1 dose if you are age 19 to 21 years and a first-year college student living in a residence hall, or have one of several medical conditions, you need to get vaccinated against meningococcal disease. You may also need additional booster doses.   Varicella immunization.** / Consult your caregiver.   Hepatitis A immunization.** / Consult your caregiver. 2 doses, 6 to 18 months apart.   Hepatitis B  immunization.** / Consult your caregiver. 3 doses usually over 6 months.  Ages 40 to 64  Blood pressure check.** / Every 1 to 2 years.   Lipid and cholesterol check.** / Every 5 years beginning at age 20.   Fecal occult blood test (FOBT) of stool. / Every year beginning at age 50 and continuing until age 75. You may not have to do this test if   you get colonoscopy every 10 years.   Flexible sigmoidoscopy** or colonoscopy.** / Every 5 years for a flexible sigmoidoscopy or every 10 years for a colonoscopy beginning at age 50 and continuing until age 75.   Hepatitis C blood test.** / For all people born from 1945 through 1965 and any individual with known risks for hepatitis C.   Skin self-exam. / Monthly.   Influenza immunization.** / Every year.   Pneumococcal polysaccharide immunization.** / 1 to 2 doses if you smoke cigarettes or if you have certain chronic medical conditions.   Tetanus, diphtheria, pertussis (Tdap/Td) immunization.** / A one-time dose of Tdap vaccine. After that, you need a Td booster dose every 10 years.   Measles, mumps, rubella (MMR) immunization. / You need at least 1 dose of MMR if you were born in 1957 or later. You may also need a 2nd dose.   Varicella immunization.**/ Consult your caregiver.   Meningococcal immunization.** / Consult your caregiver.   Hepatitis A immunization.** / Consult your caregiver. 2 doses, 6 to 18 months apart.   Hepatitis B immunization.** / Consult your caregiver. 3 doses, usually over 6 months.  Ages 65 and over  Blood pressure check.** / Every 1 to 2 years.   Lipid and cholesterol check.**/ Every 5 years beginning at age 20.   Fecal occult blood test (FOBT) of stool. / Every year beginning at age 50 and continuing until age 75. You may not have to do this test if you get colonoscopy every 10 years.   Flexible sigmoidoscopy** or colonoscopy.** / Every 5 years for a flexible sigmoidoscopy or every 10 years for a colonoscopy  beginning at age 50 and continuing until age 75.   Hepatitis C blood test.** / For all people born from 1945 through 1965 and any individual with known risks for hepatitis C.   Abdominal aortic aneurysm (AAA) screening.** / A one-time screening for ages 65 to 75 years who are current or former smokers.   Skin self-exam. / Monthly.   Influenza immunization.** / Every year.   Pneumococcal polysaccharide immunization.** / 1 dose at age 65 (or older) if you have never been vaccinated.   Tetanus, diphtheria, pertussis (Tdap, Td) immunization. / A one-time dose of Tdap vaccine if you are over 65 and have contact with an infant, are a healthcare worker, or simply want to be protected from whooping cough. After that, you need a Td booster dose every 10 years.   Varicella immunization. ** / Consult your caregiver.   Meningococcal immunization.** / Consult your caregiver.   Hepatitis A immunization. ** / Consult your caregiver. 2 doses, 6 to 18 months apart.   Hepatitis B immunization.** / Check with your caregiver. 3 doses, usually over 6 months.  **Family history and personal history of risk and conditions may change your caregiver's recommendations. Document Released: 04/12/2001 Document Revised: 02/03/2011 Document Reviewed: 07/12/2010 [employer] Patient Information 2012 [employer]. 

## 2011-09-27 NOTE — Assessment & Plan Note (Signed)
stable °

## 2011-09-27 NOTE — Assessment & Plan Note (Signed)
con't meds  Check labs 

## 2011-10-11 MED ORDER — ALOGLIPTIN-METFORMIN HCL 12.5-1000 MG PO TABS: 1.0000 | ORAL_TABLET | Freq: Two times a day (BID) | ORAL | Status: DC

## 2011-10-28 ENCOUNTER — Other Ambulatory Visit: Payer: Self-pay

## 2011-10-28 MED ORDER — ALOGLIPTIN-METFORMIN HCL 12.5-1000 MG PO TABS: 1.0000 | ORAL_TABLET | Freq: Two times a day (BID) | ORAL | Status: DC

## 2011-10-28 NOTE — Telephone Encounter (Signed)
Rx faxed to [facility] per the patient's wife request.       KP

## 2011-12-14 ENCOUNTER — Other Ambulatory Visit: Payer: Self-pay | Admitting: Cardiovascular Disease

## 2011-12-14 ENCOUNTER — Other Ambulatory Visit: Payer: Self-pay | Admitting: Family Medicine

## 2011-12-15 NOTE — Telephone Encounter (Signed)
Pt needs appointment then refill can be made Fax Received. Refill Completed. Kingsly Kloepfer Chowoe (R.M.A)   

## 2012-02-13 ENCOUNTER — Other Ambulatory Visit: Payer: Self-pay | Admitting: Family Medicine

## 2012-02-13 NOTE — Telephone Encounter (Signed)
Letter mailed to schedule labs.      KP 

## 2012-03-04 ENCOUNTER — Other Ambulatory Visit: Payer: Self-pay | Admitting: Family Medicine

## 2012-03-08 ENCOUNTER — Telehealth: Payer: Self-pay | Admitting: *Deleted

## 2012-03-08 NOTE — Telephone Encounter (Signed)
Left messsge stating patient need to call office to make an appointment to get more refills. number provided.

## 2012-03-15 MED ORDER — CARVEDILOL 6.25 MG PO TABS: 6.2500 mg | ORAL_TABLET | Freq: Two times a day (BID) | ORAL | Status: DC

## 2012-03-15 NOTE — Telephone Encounter (Signed)
Appointment is scheduled on 04/18/12 @ 2:15

## 2012-03-15 NOTE — Telephone Encounter (Signed)
Follow-up:    Patient called in needing a refill of his carvedilol (COREG) 6.25 MG tablet and lisinopril (PRINIVIL,ZESTRIL) 10 MG tablet.

## 2012-04-18 ENCOUNTER — Ambulatory Visit (INDEPENDENT_AMBULATORY_CARE_PROVIDER_SITE_OTHER): Payer: 59 | Admitting: Cardiovascular Disease

## 2012-04-18 ENCOUNTER — Encounter: Payer: Self-pay | Admitting: Cardiovascular Disease

## 2012-04-18 VITALS — BP 132/80 | HR 86 | Ht 69.0 in | Wt 287.8 lb

## 2012-04-18 DIAGNOSIS — I10 Essential (primary) hypertension: Secondary | ICD-10-CM

## 2012-04-18 MED ORDER — NITROGLYCERIN 0.4 MG SL SUBL: 0.4000 mg | SUBLINGUAL_TABLET | SUBLINGUAL | Status: DC | PRN

## 2012-04-18 MED ORDER — SIMVASTATIN 40 MG PO TABS: 40.0000 mg | ORAL_TABLET | Freq: Every day | ORAL | Status: DC

## 2012-04-18 MED ORDER — LISINOPRIL 10 MG PO TABS: 10.0000 mg | ORAL_TABLET | Freq: Every day | ORAL | Status: DC

## 2012-04-18 NOTE — Progress Notes (Signed)
[name] Date of Birth  02-07-1951 [facility] [address]    [address] [place], [place]  [address] [phone]  Fax  [phone]   Problem List 1. Hypertension 2. Hyperlipidemia  3. CAD - minor irregularities in his right coronary artery. Otherwise his coronary arteries were normal. He had a stress test during which he had nonsustained ventricular tachycardia. 4. Diabetes Mellitus  History of Present Illness:  Brett Canales is a 62 year old gentleman with a history of hypertension, hyperlipidemia, mild coronary artery disease, and diabetes mellitus.  He's done well since I last saw him. He's not had any episodes of chest pain or shortness of breath.  Current Outpatient Prescriptions on File Prior to Visit  Medication Sig Dispense Refill  . aspirin 81 MG tablet Take 81 mg by mouth daily.        . carvedilol (COREG) 6.25 MG tablet Take 1 tablet (6.25 mg total) by mouth 2 (two) times daily with a meal.  180 tablet  2  . ezetimibe (ZETIA) 10 MG tablet Take 10 mg by mouth daily.        [name] KAZANO 12.06-998 MG TABS TAKE 1 TABLET TWICE A DAY (STOP TAKING THE METFORMIN)  180 tablet  0  . lisinopril (PRINIVIL,ZESTRIL) 10 MG tablet TAKE 1 TABLET DAILY  90 tablet  0  . metFORMIN (GLUCOPHAGE) 1000 MG tablet TAKE 1 TABLET TWICE A DAY WITH MEALS  180 tablet  0  . NITROSTAT 0.4 MG SL tablet DISSOLVE 1 TABLET UNDER THE TONGUE AS NEEDED FOR CHEST PAIN AS DIRECTED  25 tablet  11  . pioglitazone (ACTOS) 45 MG tablet TAKE 1 TABLET DAILY  90 tablet  0  . simvastatin (ZOCOR) 40 MG tablet TAKE 1 TABLET AT BEDTIME  90 tablet  3   No current facility-administered medications on file prior to visit.    No Known Allergies  Past Medical History  Diagnosis Date  . Asthma   . Diabetes mellitus   . HTN (hypertension)   . CAD (coronary artery disease)     Mild  . Hyperlipidemia     Past Surgical History  Procedure Laterality Date  . Appendectomy    . Tonsillectomy    . Tumor removal     LEFT ARM  . Cardiac catheterization      We recently performed a which revealed minor coronary artery irregularities. He had normal left ventricle systolic function with an EF of 60%  . Cardiovascular stress test      EF 63%    History  Smoking status  . Never Smoker   Smokeless tobacco  . Never Used    History  Alcohol Use No    Family History  Problem Relation Age of Onset  . Lung cancer    . Pneumonia Mother   . Osteoporosis Mother   . Arthritis Mother   . Diabetes Mother   . Hypertension Mother   . Cancer Father     leukemia, lung  . Cancer Brother   . Diabetes Maternal Aunt   . Hypertension Maternal Aunt   . Hypertension Maternal Aunt     Reviw of Systems:  Reviewed in the HPI.  All other systems are negative.  Physical Exam: BP 132/80  Pulse 86  Ht 5\' 9"  (1.753 m)  Wt 287 lb 12.8 oz (130.545 kg)  BMI 42.48 kg/m2 The patient is alert and oriented x 3.  The mood and affect are normal.   Skin: warm and dry.  Color  is normal.    HEENT:   [place]/AT, no JVD, normal carotids  Lungs: clear    Heart: RR, no murmurs    Abdomen: soft, + BS, non tender, mildly obese  Extremities:  No c/c/e  Neuro:  Non focal, CN intact, gait is normal    04/18/2012: Normal sinus rhythm at 86. He has an incomplete right bundle spot. 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 Assessment / Plan:

## 2012-04-18 NOTE — Patient Instructions (Addendum)
Increase Carvedilol 12.5 mg twice a day.    Your physician wants you to follow-up in: 1 year. You will receive a reminder letter in the mail two months in advance. If you don't receive a letter, please call our office to schedule the follow-up appointment.

## 2012-04-18 NOTE — Assessment & Plan Note (Signed)
He overall seems to be doing well. His heart rate is a little faster than I would like consider that he has had nonsustained ventricular tachycardia in the past. We will increase his carvedilol to 12.5 mg twice a day. We will have him see his primary medical doctor for followup of that. I'll see him again in one year. He we will encourage her to call sooner if he has any additional problems.

## 2012-07-12 ENCOUNTER — Other Ambulatory Visit: Payer: Self-pay | Admitting: Family Medicine

## 2012-07-16 ENCOUNTER — Other Ambulatory Visit: Payer: Self-pay | Admitting: *Deleted

## 2012-07-16 MED ORDER — LISINOPRIL 10 MG PO TABS: 10.0000 mg | ORAL_TABLET | Freq: Every day | ORAL | Status: DC

## 2012-07-16 MED ORDER — CARVEDILOL 6.25 MG PO TABS: 6.2500 mg | ORAL_TABLET | Freq: Two times a day (BID) | ORAL | Status: DC

## 2012-07-16 NOTE — Telephone Encounter (Signed)
Fax Received. Refill Completed. Zachary Michael (R.M.A)   

## 2012-07-16 NOTE — Telephone Encounter (Signed)
Fax Received. Refill Completed. Yadriel Kerrigan Chowoe (R.M.A)   

## 2012-10-03 ENCOUNTER — Other Ambulatory Visit: Payer: Self-pay | Admitting: Family Medicine

## 2012-10-30 ENCOUNTER — Ambulatory Visit (INDEPENDENT_AMBULATORY_CARE_PROVIDER_SITE_OTHER): Payer: 59 | Admitting: Family Medicine

## 2012-10-30 ENCOUNTER — Encounter: Payer: Self-pay | Admitting: Family Medicine

## 2012-10-30 VITALS — BP 122/68 | HR 73 | Temp 98.1°F | Ht 70.0 in | Wt 281.0 lb

## 2012-10-30 DIAGNOSIS — E1159 Type 2 diabetes mellitus with other circulatory complications: Secondary | ICD-10-CM

## 2012-10-30 DIAGNOSIS — M25519 Pain in unspecified shoulder: Secondary | ICD-10-CM

## 2012-10-30 DIAGNOSIS — I10 Essential (primary) hypertension: Secondary | ICD-10-CM

## 2012-10-30 DIAGNOSIS — M25511 Pain in right shoulder: Secondary | ICD-10-CM

## 2012-10-30 DIAGNOSIS — E119 Type 2 diabetes mellitus without complications: Secondary | ICD-10-CM

## 2012-10-30 DIAGNOSIS — J45909 Unspecified asthma, uncomplicated: Secondary | ICD-10-CM

## 2012-10-30 DIAGNOSIS — Z0000 Encounter for general adult medical examination without abnormal findings: Secondary | ICD-10-CM

## 2012-10-30 DIAGNOSIS — E785 Hyperlipidemia, unspecified: Secondary | ICD-10-CM

## 2012-10-30 DIAGNOSIS — E669 Obesity, unspecified: Secondary | ICD-10-CM | POA: Insufficient documentation

## 2012-10-30 LAB — CBC WITH DIFFERENTIAL/PLATELET
Basophils Absolute: 0 10*3/uL (ref 0.0–0.1)
Basophils Relative: 0.7 % (ref 0.0–3.0)
Eosinophils Absolute: 0.3 10*3/uL (ref 0.0–0.7)
Eosinophils Relative: 4.6 % (ref 0.0–5.0)
HCT: 37.8 % — ABNORMAL LOW (ref 39.0–52.0)
Hemoglobin: 13 g/dL (ref 13.0–17.0)
Lymphocytes Relative: 21.4 % (ref 12.0–46.0)
Lymphs Abs: 1.4 10*3/uL (ref 0.7–4.0)
MCHC: 34.3 g/dL (ref 30.0–36.0)
MCV: 86.2 fl (ref 78.0–100.0)
Monocytes Absolute: 0.5 10*3/uL (ref 0.1–1.0)
Monocytes Relative: 7.4 % (ref 3.0–12.0)
Neutro Abs: 4.5 10*3/uL (ref 1.4–7.7)
Neutrophils Relative %: 65.9 % (ref 43.0–77.0)
Platelets: 207 10*3/uL (ref 150.0–400.0)
RBC: 4.39 Mil/uL (ref 4.22–5.81)
RDW: 13.8 % (ref 11.5–14.6)
WBC: 6.8 10*3/uL (ref 4.5–10.5)

## 2012-10-30 LAB — HEPATIC FUNCTION PANEL
ALT: 15 U/L (ref 0–53)
AST: 18 U/L (ref 0–37)
Albumin: 4.1 g/dL (ref 3.5–5.2)
Alkaline Phosphatase: 42 U/L (ref 39–117)
Bilirubin, Direct: 0.1 mg/dL (ref 0.0–0.3)
Total Bilirubin: 0.7 mg/dL (ref 0.3–1.2)
Total Protein: 6.8 g/dL (ref 6.0–8.3)

## 2012-10-30 LAB — POCT URINALYSIS DIPSTICK
Bilirubin, UA: NEGATIVE
Blood, UA: NEGATIVE
Glucose, UA: NEGATIVE
Ketones, UA: NEGATIVE
Leukocytes, UA: NEGATIVE
Nitrite, UA: NEGATIVE
Protein, UA: NEGATIVE
Spec Grav, UA: 1.015
Urobilinogen, UA: 0.2
pH, UA: 6.5

## 2012-10-30 LAB — BASIC METABOLIC PANEL
BUN: 15 mg/dL (ref 6–23)
CO2: 29 mEq/L (ref 19–32)
Calcium: 9.7 mg/dL (ref 8.4–10.5)
Chloride: 99 mEq/L (ref 96–112)
Creatinine, Ser: 0.7 mg/dL (ref 0.4–1.5)
GFR: 115.37 mL/min (ref >60.00)
Glucose, Bld: 143 mg/dL — ABNORMAL HIGH (ref 70–99)
Potassium: 4.1 mEq/L (ref 3.5–5.1)
Sodium: 133 mEq/L — ABNORMAL LOW (ref 135–145)

## 2012-10-30 LAB — LIPID PANEL
Cholesterol: 159 mg/dL (ref 0–200)
HDL: 53.4 mg/dL (ref >39.00)
LDL Cholesterol: 71 mg/dL (ref 0–99)
Total CHOL/HDL Ratio: 3
Triglycerides: 174 mg/dL — ABNORMAL HIGH (ref 0.0–149.0)
VLDL: 34.8 mg/dL (ref 0.0–40.0)

## 2012-10-30 LAB — PSA: PSA: 2.61 ng/mL (ref 0.10–4.00)

## 2012-10-30 LAB — MICROALBUMIN / CREATININE URINE RATIO
Creatinine,U: 146.4 mg/dL
Microalb Creat Ratio: 2 mg/g (ref 0.0–30.0)
Microalb, Ur: 2.9 mg/dL — ABNORMAL HIGH (ref 0.0–1.9)

## 2012-10-30 LAB — HEMOGLOBIN A1C: Hgb A1c MFr Bld: 7.6 % — ABNORMAL HIGH (ref 4.6–6.5)

## 2012-10-30 MED ORDER — SITAGLIP PHOS-METFORMIN HCL ER 100-1000 MG PO TB24: 1.0000 | ORAL_TABLET | Freq: Every evening | ORAL | Status: DC

## 2012-10-30 NOTE — Assessment & Plan Note (Signed)
Stable con't meds 

## 2012-10-30 NOTE — Patient Instructions (Addendum)
Preventive Care for Adults, Male  A healthy lifestyle and preventive care can promote health and wellness. Preventive health guidelines for men include the following key practices:  · A routine yearly physical is a good way to check with your caregiver about your health and preventative screening. It is a chance to share any concerns and updates on your health, and to receive a thorough exam.  · Visit your dentist for a routine exam and preventative care every 6 months. Brush your teeth twice a day and floss once a day. Good oral hygiene prevents tooth decay and gum disease.  · The frequency of eye exams is based on your age, health, family medical history, use of contact lenses, and other factors. Follow your caregiver's recommendations for frequency of eye exams.  · Eat a healthy diet. Foods like vegetables, fruits, whole grains, low-fat dairy products, and lean protein foods contain the nutrients you need without too many calories. Decrease your intake of foods high in solid fats, added sugars, and salt. Eat the right amount of calories for you. Get information about a proper diet from your caregiver, if necessary.  · Regular physical exercise is one of the most important things you can do for your health. Most adults should get at least 150 minutes of moderate-intensity exercise (any activity that increases your heart rate and causes you to sweat) each week. In addition, most adults need muscle-strengthening exercises on 2 or more days a week.  · Maintain a healthy weight. The body mass index (BMI) is a screening tool to identify possible weight problems. It provides an estimate of body fat based on height and weight. Your caregiver can help determine your BMI, and can help you achieve or maintain a healthy weight. For adults 20 years and older:  · A BMI below 18.5 is considered underweight.  · A BMI of 18.5 to 24.9 is normal.  · A BMI of 25 to 29.9 is considered overweight.  · A BMI of 30 and above is  considered obese.  · Maintain normal blood lipids and cholesterol levels by exercising and minimizing your intake of saturated fat. Eat a balanced diet with plenty of fruit and vegetables. Blood tests for lipids and cholesterol should begin at age 20 and be repeated every 5 years. If your lipid or cholesterol levels are high, you are over 50, or you are a high risk for heart disease, you may need your cholesterol levels checked more frequently. Ongoing high lipid and cholesterol levels should be treated with medicines if diet and exercise are not effective.  · If you smoke, find out from your caregiver how to quit. If you do not use tobacco, do not start.  · If you choose to drink alcohol, do not exceed 2 drinks per day. One drink is considered to be 12 ounces (355 mL) of beer, 5 ounces (148 mL) of wine, or 1.5 ounces (44 mL) of liquor.  · Avoid use of street drugs. Do not share needles with anyone. Ask for help if you need support or instructions about stopping the use of drugs.  · High blood pressure causes heart disease and increases the risk of stroke. Your blood pressure should be checked at least every 1 to 2 years. Ongoing high blood pressure should be treated with medicines, if weight loss and exercise are not effective.  · If you are 45 to 63 years old, ask your caregiver if you should take aspirin to prevent heart disease.  · Diabetes screening involves taking   a blood sample to check your fasting blood sugar level. This should be done once every 3 years, after age 45, if you are within normal weight and without risk factors for diabetes. Testing should be considered at a younger age or be carried out more frequently if you are overweight and have at least 1 risk factor for diabetes.  · Colorectal cancer can be detected and often prevented. Most routine colorectal cancer screening begins at the age of 50 and continues through age 75. However, your caregiver may recommend screening at an earlier age if you  have risk factors for colon cancer. On a yearly basis, your caregiver may provide home test kits to check for hidden blood in the stool. Use of a small camera at the end of a tube, to directly examine the colon (sigmoidoscopy or colonoscopy), can detect the earliest forms of colorectal cancer. Talk to your caregiver about this at age 50, when routine screening begins.  Direct examination of the colon should be repeated every 5 to 10 years through age 75, unless early forms of pre-cancerous polyps or small growths are found.  · Hepatitis C blood testing is recommended for all people born from 1945 through 1965 and any individual with known risks for hepatitis C.  · Practice safe sex. Use condoms and avoid high-risk sexual practices to reduce the spread of sexually transmitted infections (STIs). STIs include gonorrhea, chlamydia, syphilis, trichomonas, herpes, HPV, and human immunodeficiency virus (HIV). Herpes, HIV, and HPV are viral illnesses that have no cure. They can result in disability, cancer, and death.  · A one-time screening for abdominal aortic aneurysm (AAA) and surgical repair of large AAAs by sound wave imaging (ultrasonography) is recommended for ages 65 to 75 years who are current or former smokers.  · Healthy men should no longer receive prostate-specific antigen (PSA) blood tests as part of routine cancer screening. Consult with your caregiver about prostate cancer screening.  · Testicular cancer screening is not recommended for adult males who have no symptoms. Screening includes self-exam, caregiver exam, and other screening tests. Consult with your caregiver about any symptoms you have or any concerns you have about testicular cancer.  · Use sunscreen with skin protection factor (SPF) of 30 or more. Apply sunscreen liberally and repeatedly throughout the day. You should seek shade when your shadow is shorter than you. Protect yourself by wearing long sleeves, pants, a wide-brimmed hat, and  sunglasses year round, whenever you are outdoors.  · Once a month, do a whole body skin exam, using a mirror to look at the skin on your back. Notify your caregiver of new moles, moles that have irregular borders, moles that are larger than a pencil eraser, or moles that have changed in shape or color.  · Stay current with required immunizations.  · Influenza. You need a dose every fall (or winter). The composition of the flu vaccine changes each year, so being vaccinated once is not enough.  · Pneumococcal polysaccharide. You need 1 to 2 doses if you smoke cigarettes or if you have certain chronic medical conditions. You need 1 dose at age 65 (or older) if you have never been vaccinated.  · Tetanus, diphtheria, pertussis (Tdap, Td). Get 1 dose of Tdap vaccine if you are younger than age 65 years, are over 65 and have contact with an infant, are a healthcare worker, or simply want to be protected from whooping cough. After that, you need a Td booster dose every 10 years. Consult your   caregiver if you have not had at least 3 tetanus and diphtheria-containing shots sometime in your life or have a deep or dirty wound.  · HPV. This vaccine is recommended for males 13 through 63 years of age. This vaccine may be given to men 22 through 63 years of age who have not completed the 3 dose series. It is recommended for men through age 26 who have sex with men or whose immune system is weakened because of HIV infection, other illness, or medications. The vaccine is given in 3 doses over 6 months.  · Measles, mumps, rubella (MMR). You need at least 1 dose of MMR if you were born in 1957 or later. You may also need a 2nd dose.  · Meningococcal. If you are age 19 to 21 years and a first-year college student living in a residence hall, or have one of several medical conditions, you need to get vaccinated against meningococcal disease. You may also need additional booster doses.  · Zoster (shingles). If you are age 60 years or  older, you should get this vaccine.  · Varicella (chickenpox). If you have never had chickenpox or you were vaccinated but received only 1 dose, talk to your caregiver to find out if you need this vaccine.  · Hepatitis A. You need this vaccine if you have a specific risk factor for hepatitis A virus infection, or you simply wish to be protected from this disease. The vaccine is usually given as 2 doses, 6 to 18 months apart.  · Hepatitis B. You need this vaccine if you have a specific risk factor for hepatitis B virus infection or you simply wish to be protected from this disease. The vaccine is given in 3 doses, usually over 6 months.  Preventative Service / Frequency  Ages 19 to 39  · Blood pressure check.** / Every 1 to 2 years.  · Lipid and cholesterol check.** / Every 5 years beginning at age 20.  · Hepatitis C blood test.** / For any individual with known risks for hepatitis C.  · Skin self-exam. / Monthly.  · Influenza immunization.** / Every year.  · Pneumococcal polysaccharide immunization.** / 1 to 2 doses if you smoke cigarettes or if you have certain chronic medical conditions.  · Tetanus, diphtheria, pertussis (Tdap,Td) immunization. / A one-time dose of Tdap vaccine. After that, you need a Td booster dose every 10 years.  · HPV immunization. / 3 doses over 6 months, if 26 and younger.  · Measles, mumps, rubella (MMR) immunization. / You need at least 1 dose of MMR if you were born in 1957 or later. You may also need a 2nd dose.  · Meningococcal immunization. / 1 dose if you are age 19 to 21 years and a first-year college student living in a residence hall, or have one of several medical conditions, you need to get vaccinated against meningococcal disease. You may also need additional booster doses.  · Varicella immunization.** / Consult your caregiver.  · Hepatitis A immunization.** / Consult your caregiver. 2 doses, 6 to 18 months apart.  · Hepatitis B immunization.** / Consult your caregiver. 3 doses  usually over 6 months.  Ages 40 to 64  · Blood pressure check.** / Every 1 to 2 years.  · Lipid and cholesterol check.** / Every 5 years beginning at age 20.  · Fecal occult blood test (FOBT) of stool. / Every year beginning at age 50 and continuing until age 75. You may not have   to do this test if you get colonoscopy every 10 years.  · Flexible sigmoidoscopy** or colonoscopy.** / Every 5 years for a flexible sigmoidoscopy or every 10 years for a colonoscopy beginning at age 50 and continuing until age 75.  · Hepatitis C blood test.** / For all people born from 1945 through 1965 and any individual with known risks for hepatitis C.  · Skin self-exam. / Monthly.  · Influenza immunization.** / Every year.  · Pneumococcal polysaccharide immunization.** / 1 to 2 doses if you smoke cigarettes or if you have certain chronic medical conditions.  · Tetanus, diphtheria, pertussis (Tdap/Td) immunization.** / A one-time dose of Tdap vaccine. After that, you need a Td booster dose every 10 years.  · Measles, mumps, rubella (MMR) immunization.  / You need at least 1 dose of MMR if you were born in 1957 or later. You may also need a 2nd dose.  · Varicella immunization.**/ Consult your caregiver.  · Meningococcal immunization.** / Consult your caregiver.  · Hepatitis A immunization.** / Consult your caregiver. 2 doses, 6 to 18 months apart.  · Hepatitis B immunization.** / Consult your caregiver. 3 doses, usually over 6 months.  Ages 65 and over  · Blood pressure check.** / Every 1 to 2 years.  · Lipid and cholesterol check.**/ Every 5 years beginning at age 20.  · Fecal occult blood test (FOBT) of stool. / Every year beginning at age 50 and continuing until age 75. You may not have to do this test if you get colonoscopy every 10 years.  · Flexible sigmoidoscopy** or colonoscopy.** / Every 5 years for a flexible sigmoidoscopy or every 10 years for a colonoscopy beginning at age 50 and continuing until age 75.  · Hepatitis C blood  test.** / For all people born from 1945 through 1965 and any individual with known risks for hepatitis C.  · Abdominal aortic aneurysm (AAA) screening.** / A one-time screening for ages 65 to 75 years who are current or former smokers.  · Skin self-exam. / Monthly.  · Influenza immunization.** / Every year.  · Pneumococcal polysaccharide immunization.** / 1 dose at age 65 (or older) if you have never been vaccinated.  · Tetanus, diphtheria, pertussis (Tdap, Td) immunization. / A one-time dose of Tdap vaccine if you are over 65 and have contact with an infant, are a healthcare worker, or simply want to be protected from whooping cough. After that, you need a Td booster dose every 10 years.  · Varicella immunization. ** / Consult your caregiver.  · Meningococcal immunization.** / Consult your caregiver.  · Hepatitis A immunization. ** / Consult your caregiver. 2 doses, 6 to 18 months apart.  · Hepatitis B immunization.** / Check with your caregiver. 3 doses, usually over 6 months.  **Family history and personal history of risk and conditions may change your caregiver's recommendations.  Document Released: 04/12/2001 Document Revised: 05/09/2011 Document Reviewed: 07/12/2010  [employer]® Patient Information ©2014 [employer].

## 2012-10-30 NOTE — Assessment & Plan Note (Signed)
Check labs Check BS bid

## 2012-10-30 NOTE — Progress Notes (Signed)
Subjective:    Patient ID: Zachary Michael, male    DOB: 04-03-1950, 62 y.o.   MRN: [id number]  HPI Pt here for cpe and labs.   Review of Systems Review of Systems  Constitutional: Negative for activity change, appetite change and fatigue.  HENT: Negative for hearing loss, congestion, tinnitus and ear discharge.  dentist q36m Eyes: Negative for visual disturbance (see optho q1y -- )  Respiratory: Negative for cough, chest tightness and shortness of breath.   Cardiovascular: Negative for chest pain, palpitations and leg swelling.  Gastrointestinal: Negative for abdominal pain, diarrhea, constipation and abdominal distention.  Genitourinary: Negative for urgency, frequency, decreased urine volume and difficulty urinating.  Musculoskeletal: Negative for back pain, arthralgias and gait problem.  Skin: Negative for color change, pallor and rash.  Neurological: Negative for dizziness, light-headedness, numbness and headaches.  Hematological: Negative for adenopathy. Does not bruise/bleed easily.  Psychiatric/Behavioral: Negative for suicidal ideas, confusion, sleep disturbance, self-injury, dysphoric mood, decreased concentration and agitation.   Past Medical History  Diagnosis Date  . Asthma   . Diabetes mellitus   . HTN (hypertension)   . CAD (coronary artery disease)     Mild  . Hyperlipidemia    History   Social History  . Marital Status: Married    Spouse Name: N/A    Number of Children: 3  . Years of Education: N/A   Occupational History  . [employer]    Social History Main Topics  . Smoking status: Never Smoker   . Smokeless tobacco: Never Used  . Alcohol Use: No  . Drug Use: No  . Sexual Activity: Yes    Partners: Female   Other Topics Concern  . Not on file   Social History Narrative   Exercise-- walking at work   Family History  Problem Relation Age of Onset  . Lung cancer    . Pneumonia Mother   . Osteoporosis Mother   . Arthritis Mother   .  Diabetes Mother   . Hypertension Mother   . Cancer Father     leukemia, lung  . Cancer Brother   . Diabetes Maternal Aunt   . Hypertension Maternal Aunt   . Hypertension Maternal Aunt    Current Outpatient Prescriptions on File Prior to Visit  Medication Sig Dispense Refill  . aspirin 81 MG tablet Take 81 mg by mouth daily.        . carvedilol (COREG) 6.25 MG tablet Take 1 tablet (6.25 mg total) by mouth 2 (two) times daily with a meal.  180 tablet  3  . ezetimibe (ZETIA) 10 MG tablet Take 10 mg by mouth daily.        [name] lisinopril (PRINIVIL,ZESTRIL) 10 MG tablet Take 1 tablet (10 mg total) by mouth daily.  90 tablet  3  . nitroGLYCERIN (NITROSTAT) 0.4 MG SL tablet Place 1 tablet (0.4 mg total) under the tongue every 5 (five) minutes as needed for chest pain.  25 tablet  5  . pioglitazone (ACTOS) 45 MG tablet TAKE 1 TABLET DAILY (LABS ARE DUE NOW)  90 tablet  0  . simvastatin (ZOCOR) 40 MG tablet Take 1 tablet (40 mg total) by mouth at bedtime.  90 tablet  3   No current facility-administered medications on file prior to visit.   Past Surgical History  Procedure Laterality Date  . Appendectomy    . Tonsillectomy    . Tumor removal      LEFT ARM  . Cardiac catheterization  We recently performed a which revealed minor coronary artery irregularities. He had normal left ventricle systolic function with an EF of 60%  . Cardiovascular stress test      EF 63%   .......      Objective:   Physical Exam BP 122/68  Pulse 73  Temp(Src) 98.1 F (36.7 C) (Oral)  Ht 5\' 10"  (1.778 m)  Wt 281 lb (127.461 kg)  BMI 40.32 kg/m2  SpO2 98% General appearance: alert, cooperative, appears stated age and no distress Head: Normocephalic, without obvious abnormality, atraumatic Eyes: conjunctivae/corneas clear. PERRL, EOM's intact. Fundi benign. Ears: normal TM's and external ear canals both ears Nose: Nares normal. Septum midline. Mucosa normal. No drainage or sinus tenderness. Throat:  lips, mucosa, and tongue normal; teeth and gums normal Neck: no adenopathy, no carotid bruit, no JVD, supple, symmetrical, trachea midline and thyroid not enlarged, symmetric, no tenderness/mass/nodules Back: symmetric, no curvature. ROM normal. No CVA tenderness. Lungs: clear to auscultation bilaterally Chest wall: no tenderness Heart: S1, S2 normal Abdomen: soft, non-tender; bowel sounds normal; no masses,  no organomegaly Male genitalia: normal, penis: no lesions or discharge. testes: no masses or tenderness. no hernias Rectal: normal tone, normal prostate, no masses or tenderness and soft brown guaiac negative stool noted Extremities: extremities normal, atraumatic, no cyanosis or edema Pulses: 2+ and symmetric Skin: Skin color, texture, turgor normal. No rashes or lesions Lymph nodes: Cervical, supraclavicular, and axillary nodes normal. Neurologic: Alert and oriented X 3, normal strength and tone. Normal symmetric reflexes. Normal coordination and gait Psych--no depression, no anxiety        Assessment & Plan:

## 2012-10-30 NOTE — Assessment & Plan Note (Signed)
con't inhalers  

## 2012-10-30 NOTE — Assessment & Plan Note (Signed)
Check labs con't meds 

## 2012-11-08 ENCOUNTER — Encounter: Payer: Self-pay | Admitting: Family Medicine

## 2012-11-08 DIAGNOSIS — E1159 Type 2 diabetes mellitus with other circulatory complications: Secondary | ICD-10-CM

## 2012-11-08 NOTE — Addendum Note (Signed)
Addended by: Arnette Norris on: 11/08/2012 05:40 PM   Modules accepted: Orders

## 2012-11-09 MED ORDER — METFORMIN HCL 1000 MG PO TABS: ORAL_TABLET | ORAL | Status: DC

## 2012-11-09 NOTE — Addendum Note (Signed)
Addended by: Arnette Norris on: 11/09/2012 11:33 AM   Modules accepted: Orders, Medications

## 2012-12-31 ENCOUNTER — Other Ambulatory Visit: Payer: Self-pay | Admitting: Family Medicine

## 2013-01-03 ENCOUNTER — Other Ambulatory Visit: Payer: Self-pay

## 2013-01-07 ENCOUNTER — Ambulatory Visit: Payer: 59 | Admitting: *Deleted

## 2013-01-07 DIAGNOSIS — Z23 Encounter for immunization: Secondary | ICD-10-CM

## 2013-01-07 MED ORDER — PNEUMOCOCCAL VAC POLYVALENT 25 MCG/0.5ML IJ INJ: 0.5000 mL | INJECTION | Freq: Once | INTRAMUSCULAR | Status: DC

## 2013-04-08 ENCOUNTER — Other Ambulatory Visit: Payer: Self-pay | Admitting: Cardiovascular Disease

## 2013-05-14 ENCOUNTER — Other Ambulatory Visit: Payer: Self-pay | Admitting: Family Medicine

## 2013-06-03 ENCOUNTER — Ambulatory Visit (INDEPENDENT_AMBULATORY_CARE_PROVIDER_SITE_OTHER): Payer: 59 | Admitting: Cardiovascular Disease

## 2013-06-03 ENCOUNTER — Encounter: Payer: Self-pay | Admitting: Cardiovascular Disease

## 2013-06-03 VITALS — BP 128/70 | HR 65 | Ht 70.0 in | Wt 290.8 lb

## 2013-06-03 DIAGNOSIS — I10 Essential (primary) hypertension: Secondary | ICD-10-CM

## 2013-06-03 DIAGNOSIS — R609 Edema, unspecified: Secondary | ICD-10-CM

## 2013-06-03 DIAGNOSIS — E785 Hyperlipidemia, unspecified: Secondary | ICD-10-CM

## 2013-06-03 NOTE — Progress Notes (Signed)
[name] Date of Birth  08/10/1950 [facility] [address]    [address] [place], [place]  [address] [phone]  Fax  [phone]   Problem List 1. Hypertension 2. Hyperlipidemia  3. CAD - minor irregularities in his right coronary artery. Otherwise his coronary arteries were normal. He had a stress test during which he had nonsustained ventricular tachycardia. 4. Diabetes Mellitus  History of Present Illness:  Brett Canales is a 63 year old gentleman with a history of hypertension, hyperlipidemia, mild coronary artery disease, and diabetes mellitus.  He's done well since I last saw him. He's not had any episodes of chest pain or shortness of breath.  June 03, 2013:   Keynan has retired since I last saw him.  He is doing well.  Occasional palpitations.  No syncope, no pre-syncope,  Sounds clinically like  a PVC.  Still eating salty foods.  Not exercising much, doing chores around the house - no regular exercise.   Not eating out as much.     Current Outpatient Prescriptions on File Prior to Visit  Medication Sig Dispense Refill  . aspirin 81 MG tablet Take 81 mg by mouth daily.        . carvedilol (COREG) 6.25 MG tablet Take 1 tablet (6.25 mg total) by mouth 2 (two) times daily with a meal.  180 tablet  3  . ezetimibe (ZETIA) 10 MG tablet Take 10 mg by mouth daily.        [name] lisinopril (PRINIVIL,ZESTRIL) 10 MG tablet Take 1 tablet (10 mg total) by mouth daily.  90 tablet  3  . metFORMIN (GLUCOPHAGE) 1000 MG tablet 1 tab by mouth twice daily--repeat labs are due now  180 tablet  0  . nitroGLYCERIN (NITROSTAT) 0.4 MG SL tablet Place 1 tablet (0.4 mg total) under the tongue every 5 (five) minutes as needed for chest pain.  25 tablet  5  . pioglitazone (ACTOS) 45 MG tablet Take 1 tablet (45 mg total) by mouth daily.  90 tablet  1  . simvastatin (ZOCOR) 40 MG tablet TAKE 1 TABLET (40 MG TOTAL) AT BEDTIME  90 tablet  0   No current facility-administered medications on file  prior to visit.    No Known Allergies  Past Medical History  Diagnosis Date  . Asthma   . Diabetes mellitus   . HTN (hypertension)   . CAD (coronary artery disease)     Mild  . Hyperlipidemia     Past Surgical History  Procedure Laterality Date  . Appendectomy    . Tonsillectomy    . Tumor removal      LEFT ARM  . Cardiac catheterization      We recently performed a which revealed minor coronary artery irregularities. He had normal left ventricle systolic function with an EF of 60%  . Cardiovascular stress test      EF 63%    History  Smoking status  . Never Smoker   Smokeless tobacco  . Never Used    History  Alcohol Use No    Family History  Problem Relation Age of Onset  . Lung cancer    . Pneumonia Mother   . Osteoporosis Mother   . Arthritis Mother   . Diabetes Mother   . Hypertension Mother   . Cancer Father     leukemia, lung  . Cancer Brother   . Diabetes Maternal Aunt   . Hypertension Maternal Aunt   . Hypertension Maternal Aunt  Reviw of Systems:  Reviewed in the HPI.  All other systems are negative.  Physical Exam: BP 128/70  Pulse 65  Ht 5\' 10"  (1.778 m)  Wt 290 lb 12.8 oz (131.906 kg)  BMI 41.73 kg/m2 The patient is alert and oriented x 3.  The mood and affect are normal.   Skin: warm and dry.  Color is normal.    HEENT:   [place]/AT, no JVD, normal carotids  Lungs: clear    Heart: RR, no murmurs    Abdomen: soft, + BS, non tender, mildly obese  Extremities:   1-2 + edema, chronic stasis changes.   Neuro:  Non focal, CN intact, gait is normal    June 03, 2013:  NSR, LAHB, otherwise normal.  Assessment / Plan:

## 2013-06-03 NOTE — Patient Instructions (Addendum)
Your physician wants you to follow-up in: 1 YEAR You will receive a reminder letter in the mail two months in advance. If you don't receive a letter, please call our office to schedule the follow-up appointment.  Your physician recommends that you return for a FASTING lipid profile:1 YEAR   REDUCE HIGH SODIUM FOODS LIKE CANNED SOUP, GRAVY, SAUCES, READY PREPARED FOODS LIKE FROZEN FOODS; LEAN CUISINE, LASAGNA. BACON, SAUSAGE, LUNCH MEAT, FAST FOODS, HOT DOGS, CHIPS, PIZZA, CHINESE FOOD, SOY SAUCE, STORE BOUGHT FRIED CHICKEN= [place] FRIED CHICKEN/ BOJANGLES.  PICKLES, OLIVES, KETCHUP   [facility] Low Glycemic Diet (Source: [facility]) Low Glycemic Foods (20-49) (Decrease risk of developing heart disease) Breakfast Cereals: All-Bran All-Bran Fruit 'n Oats Fiber One Oatmeal (not instant) Oat bran Fruits and fruit juices: (Limit to 1-2 servings per day) Apples Apricots (fresh & dried) Blackberries Blueberries Cherries Cranberries Peaches Pears Plums Prunes Grapefruit Raspberries Strawberries Tangerine Apple juice Grapefruit juice Tomato juice Beans and legumes (fresh-cooked): Black-eyed peas Butter beans Chick peas Lentils  Green beans Lima beans Kidney beans Navy beans Pinto beans Snow peas Non-starchy vegetables: Asparagus, avocado, broccoli, cabbage, cauliflower, celery, cucumber, greens, lettuce, mushrooms, peppers, tomatoes, okra, onions, spinach, summer squash Grains: Barley Bulgur Rye Wild rice Nuts and oils : Almonds Peanuts Sunflower seeds Hazelnuts Pecans Walnuts Oils that are liquid at room temperature Dairy, fish, meat, soy, and eggs: Milk, skim Lowfat cheese Yogurt, lowfat, fruit sugar sweetened Lean red meat Fish  Skinless chicken & [place]urkey Shellfish Egg whites (up to 3 daily) Soy products  Egg yolks (up to 7 or _____ per week) Moderate Glycemic Foods (50-69) Breakfast Cereals: Bran Buds Bran Chex Just Right Mini-Wheats   Special K Swiss muesli Fruits: Banana (under-ripe) Dates Figs Grapes Kiwi Mango Oranges Raisins Fruit Juices: Cranberry juice Orange juice Beans and legumes: [place]-type baked beans Canned pinto, kidney, or navy beans Green peas Vegetables: Beets Carrots  Sweet potato Yam Corn on the cob Breads: Pita (pocket) bread Oat bran bread Pumpernickel bread Rye bread Wheat bread, high fiber  Grains: Cornmeal Rice, brown Rice, white Couscous Pasta: Macaroni Pizza, cheese Ravioli, meat filled Spaghetti, white  Nuts: Cashews Macadamia Snacks: Chocolate Ice cream, lowfat Muffin Popcorn High Glycemic Foods (70-100)  Breakfast Cereals: Cheerios Corn Chex Corn Flakes Cream of Wheat Grape Nuts Grape Nut Flakes Grits Nutri-Grain Puffed Rice Puffed Wheat Rice Chex Rice Krispies Shredded Wheat Team Total Fruits: Pineapple Watermelon Banana (over-ripe) Beverages: Sodas, sweet tea, pineapple juice Vegetables: Potato, baked, boiled, fried, mashed JamaicaFrench fries Canned or frozen corn Parsnips Winter squash Breads: Most breads (white and whole grain) Bagels Bread sticks Bread stuffing Kaiser roll Dinner rolls Grains: Rice, instant Tapioca, with milk Candy and most cookies Snacks: Donuts Corn chips Jelly beans Pretzels Pastries

## 2013-06-03 NOTE — Assessment & Plan Note (Signed)
BP is ok.  He has 1-2+ leg edema ,  He still eats salty foods.   Will give him info on a low salt diet. Encouraged him to walk.  Will see him in 1 year

## 2013-06-03 NOTE — Assessment & Plan Note (Signed)
We'll see him in one year. We'll check fasting lipids at that time.

## 2013-06-06 ENCOUNTER — Other Ambulatory Visit: Payer: Self-pay

## 2013-08-04 ENCOUNTER — Other Ambulatory Visit: Payer: Self-pay | Admitting: Cardiovascular Disease

## 2013-08-04 ENCOUNTER — Other Ambulatory Visit: Payer: Self-pay | Admitting: Family Medicine

## 2013-10-31 ENCOUNTER — Ambulatory Visit (INDEPENDENT_AMBULATORY_CARE_PROVIDER_SITE_OTHER): Payer: 59 | Admitting: Family Medicine

## 2013-10-31 ENCOUNTER — Encounter: Payer: Self-pay | Admitting: Family Medicine

## 2013-10-31 ENCOUNTER — Other Ambulatory Visit: Payer: Self-pay | Admitting: Family Medicine

## 2013-10-31 VITALS — BP 128/74 | HR 58 | Temp 98.3°F | Ht 69.5 in | Wt 283.3 lb

## 2013-10-31 DIAGNOSIS — Z0000 Encounter for general adult medical examination without abnormal findings: Secondary | ICD-10-CM

## 2013-10-31 DIAGNOSIS — E785 Hyperlipidemia, unspecified: Secondary | ICD-10-CM

## 2013-10-31 DIAGNOSIS — M25519 Pain in unspecified shoulder: Secondary | ICD-10-CM

## 2013-10-31 DIAGNOSIS — I10 Essential (primary) hypertension: Secondary | ICD-10-CM

## 2013-10-31 DIAGNOSIS — E1159 Type 2 diabetes mellitus with other circulatory complications: Secondary | ICD-10-CM

## 2013-10-31 LAB — CBC WITH DIFFERENTIAL/PLATELET
Basophils Absolute: 0.1 10*3/uL (ref 0.0–0.1)
Basophils Relative: 1 % (ref 0.0–3.0)
Eosinophils Absolute: 0.3 10*3/uL (ref 0.0–0.7)
Eosinophils Relative: 5.3 % — ABNORMAL HIGH (ref 0.0–5.0)
HCT: 37.4 % — ABNORMAL LOW (ref 39.0–52.0)
Hemoglobin: 12.8 g/dL — ABNORMAL LOW (ref 13.0–17.0)
Lymphocytes Relative: 23.3 % (ref 12.0–46.0)
Lymphs Abs: 1.4 10*3/uL (ref 0.7–4.0)
MCHC: 34.2 g/dL (ref 30.0–36.0)
MCV: 87.4 fl (ref 78.0–100.0)
Monocytes Absolute: 0.5 10*3/uL (ref 0.1–1.0)
Monocytes Relative: 7.7 % (ref 3.0–12.0)
Neutro Abs: 3.7 10*3/uL (ref 1.4–7.7)
Neutrophils Relative %: 62.7 % (ref 43.0–77.0)
Platelets: 216 10*3/uL (ref 150.0–400.0)
RBC: 4.27 Mil/uL (ref 4.22–5.81)
RDW: 13.1 % (ref 11.5–15.5)
WBC: 5.9 10*3/uL (ref 4.0–10.5)

## 2013-10-31 LAB — HEPATIC FUNCTION PANEL
ALT: 16 U/L (ref 0–53)
AST: 16 U/L (ref 0–37)
Albumin: 4 g/dL (ref 3.5–5.2)
Alkaline Phosphatase: 42 U/L (ref 39–117)
Bilirubin, Direct: 0.1 mg/dL (ref 0.0–0.3)
Total Bilirubin: 0.8 mg/dL (ref 0.2–1.2)
Total Protein: 6.8 g/dL (ref 6.0–8.3)

## 2013-10-31 LAB — BASIC METABOLIC PANEL
BUN: 15 mg/dL (ref 6–23)
CO2: 28 mEq/L (ref 19–32)
Calcium: 9.4 mg/dL (ref 8.4–10.5)
Chloride: 102 mEq/L (ref 96–112)
Creatinine, Ser: 0.8 mg/dL (ref 0.4–1.5)
GFR: 106.53 mL/min (ref >60.00)
Glucose, Bld: 158 mg/dL — ABNORMAL HIGH (ref 70–99)
Potassium: 4.2 mEq/L (ref 3.5–5.1)
Sodium: 137 mEq/L (ref 135–145)

## 2013-10-31 LAB — POCT URINALYSIS DIPSTICK
Bilirubin, UA: NEGATIVE
Blood, UA: NEGATIVE
Glucose, UA: NEGATIVE
Ketones, UA: NEGATIVE
Leukocytes, UA: NEGATIVE
Nitrite, UA: NEGATIVE
Protein, UA: NEGATIVE
Spec Grav, UA: 1.02
Urobilinogen, UA: 0.2
pH, UA: 6

## 2013-10-31 LAB — LIPID PANEL
Cholesterol: 162 mg/dL (ref 0–200)
HDL: 53.7 mg/dL (ref >39.00)
LDL Cholesterol: 86 mg/dL (ref 0–99)
NonHDL: 108.3
Total CHOL/HDL Ratio: 3
Triglycerides: 113 mg/dL (ref 0.0–149.0)
VLDL: 22.6 mg/dL (ref 0.0–40.0)

## 2013-10-31 LAB — MICROALBUMIN / CREATININE URINE RATIO
Creatinine,U: 140.5 mg/dL
Microalb Creat Ratio: 3.1 mg/g (ref 0.0–30.0)
Microalb, Ur: 4.3 mg/dL — ABNORMAL HIGH (ref 0.0–1.9)

## 2013-10-31 LAB — PSA: PSA: 2.69 ng/mL (ref 0.10–4.00)

## 2013-10-31 LAB — HEMOGLOBIN A1C: Hgb A1c MFr Bld: 7.9 % — ABNORMAL HIGH (ref 4.6–6.5)

## 2013-10-31 NOTE — Progress Notes (Signed)
Pre visit review using our clinic review tool, if applicable. No additional management support is needed unless otherwise documented below in the visit note. 

## 2013-10-31 NOTE — Progress Notes (Signed)
Subjective:    Patient ID: Zachary Michael, male    DOB: 04-16-1950, 63 y.o.   MRN: [id number]  HPI Pt here for cpe and labs. He only complains about shoulder hurting.    Review of Systems Review of Systems  Constitutional: Negative for activity change, appetite change and fatigue.  HENT: Negative for hearing loss, congestion, tinnitus and ear discharge.  dentist q73m Eyes: Negative for visual disturbance (see optho q1y -- vision corrected to 20/20 with glasses).  Respiratory: Negative for cough, chest tightness and shortness of breath.   Cardiovascular: Negative for chest pain, palpitations and leg swelling.  Gastrointestinal: Negative for abdominal pain, diarrhea, constipation and abdominal distention.  Genitourinary: Negative for urgency, frequency, decreased urine volume and difficulty urinating.  Musculoskeletal: Negative for back pain, arthralgias and gait problem.  Skin: Negative for color change, pallor and rash.  Neurological: Negative for dizziness, light-headedness, numbness and headaches.  Hematological: Negative for adenopathy. Does not bruise/bleed easily.  Psychiatric/Behavioral: Negative for suicidal ideas, confusion, sleep disturbance, self-injury, dysphoric mood, decreased concentration and agitation.     Past Medical History  Diagnosis Date  . Asthma   . Diabetes mellitus   . HTN (hypertension)   . CAD (coronary artery disease)     Mild  . Hyperlipidemia    History   Social History  . Marital Status: Married    Spouse Name: N/A    Number of Children: 3  . Years of Education: N/A   Occupational History  . [employer]    Social History Main Topics  . Smoking status: Never Smoker   . Smokeless tobacco: Never Used  . Alcohol Use: No  . Drug Use: No  . Sexual Activity: Yes    Partners: Female   Other Topics Concern  . Not on file   Social History Narrative   Exercise-- walking at work   Family History  Problem Relation Age of Onset  .  Lung cancer    . Pneumonia Mother   . Osteoporosis Mother   . Arthritis Mother   . Diabetes Mother   . Hypertension Mother   . Cancer Father     leukemia, lung  . Cancer Brother   . Diabetes Maternal Aunt   . Hypertension Maternal Aunt   . Hypertension Maternal Aunt    Current Outpatient Prescriptions  Medication Sig Dispense Refill  . aspirin 81 MG tablet Take 81 mg by mouth daily.        . carvedilol (COREG) 6.25 MG tablet TAKE 1 TABLET TWICE A DAY WITH MEALS  180 tablet  2  . ezetimibe (ZETIA) 10 MG tablet Take 10 mg by mouth daily.        [name] lisinopril (PRINIVIL,ZESTRIL) 10 MG tablet TAKE 1 TABLET DAILY  90 tablet  2  . metFORMIN (GLUCOPHAGE) 1000 MG tablet TAKE 1 TABLET TWICE A DAY  (REPEAT LABS ARE DUE NOW)  180 tablet  0  . nitroGLYCERIN (NITROSTAT) 0.4 MG SL tablet Place 1 tablet (0.4 mg total) under the tongue every 5 (five) minutes as needed for chest pain.  25 tablet  5  . pioglitazone (ACTOS) 45 MG tablet TAKE 1 TABLET DAILY  90 tablet  0  . simvastatin (ZOCOR) 40 MG tablet TAKE 1 TABLET AT BEDTIME  90 tablet  2   No current facility-administered medications for this visit.   No Known Allergies     Objective:   Physical Exam BP 128/74  Pulse 58  Temp(Src) 98.3 F (  36.8 C) (Oral)  Ht 5' 9.5" (1.765 m)  Wt 283 lb 4.7 oz (128.5 kg)  BMI 41.25 kg/m2  SpO2 97% General appearance: alert, cooperative, appears stated age and no distress Head: Normocephalic, without obvious abnormality, atraumatic Eyes: conjunctivae/corneas clear. PERRL, EOM's intact. Fundi benign. Ears: normal TM's and external ear canals both ears Nose: Nares normal. Septum midline. Mucosa normal. No drainage or sinus tenderness. Throat: lips, mucosa, and tongue normal; teeth and gums normal Neck: no adenopathy, no carotid bruit, no JVD, supple, symmetrical, trachea midline and thyroid not enlarged, symmetric, no tenderness/mass/nodules Back: symmetric, no curvature. ROM normal. No CVA  tenderness. Lungs: clear to auscultation bilaterally Chest wall: no tenderness Heart: regular rate and rhythm, S1, S2 normal, no murmur, click, rub or gallop Abdomen: soft, non-tender; bowel sounds normal; no masses,  no organomegaly Male genitalia: normal, penis: no lesions or discharge. testes: no masses or tenderness. no hernias Rectal: normal tone, normal prostate, no masses or tenderness Extremities: extremities normal, atraumatic, no cyanosis or edema Pulses: 2+ and symmetric Skin: Skin color, texture, turgor normal. No rashes or lesions Lymph nodes: Cervical, supraclavicular, and axillary nodes normal. Neurologic: Alert and oriented X 3, normal strength and tone. Normal symmetric reflexes. Normal coordination and gait Psych- no depression, no anxiety        Assessment & Plan:  1. Preventative health care Check labs, ghm utd Pt refuses vaccines - CBC with Differential - PSA  2. Other and unspecified hyperlipidemia Check labs - CBC with Differential - Hepatic function panel - Lipid panel - Microalbumin / creatinine urine ratio - POCT urinalysis dipstick  3. Essential hypertension stable - Basic metabolic panel - CBC with Differential - Microalbumin / creatinine urine ratio - POCT urinalysis dipstick  4. Type II or unspecified type diabetes mellitus with peripheral circulatory disorders, uncontrolled(250.72) Stable, con't meds - CBC with Differential - Hemoglobin A1c - Microalbumin / creatinine urine ratio - POCT urinalysis dipstick  5. Pain in joint, shoulder region, unspecified laterality Refer back to ortho - Ambulatory referral to Orthopedic Surgery

## 2013-10-31 NOTE — Patient Instructions (Addendum)
Preventive Care for Adults A healthy lifestyle and preventive care can promote health and wellness. Preventive health guidelines for men include the following key practices:  A routine yearly physical is a good way to check with your health care provider about your health and preventative screening. It is a chance to share any concerns and updates on your health and to receive a thorough exam.  Visit your dentist for a routine exam and preventative care every 6 months. Brush your teeth twice a day and floss once a day. Good oral hygiene prevents tooth decay and gum disease.  The frequency of eye exams is based on your age, health, family medical history, use of contact lenses, and other factors. Follow your health care provider's recommendations for frequency of eye exams.  Eat a healthy diet. Foods such as vegetables, fruits, whole grains, low-fat dairy products, and lean protein foods contain the nutrients you need without too many calories. Decrease your intake of foods high in solid fats, added sugars, and salt. Eat the right amount of calories for you.Get information about a proper diet from your health care provider, if necessary.  Regular physical exercise is one of the most important things you can do for your health. Most adults should get at least 150 minutes of moderate-intensity exercise (any activity that increases your heart rate and causes you to sweat) each week. In addition, most adults need muscle-strengthening exercises on 2 or more days a week.  Maintain a healthy weight. The body mass index (BMI) is a screening tool to identify possible weight problems. It provides an estimate of body fat based on height and weight. Your health care provider can find your BMI and can help you achieve or maintain a healthy weight.For adults 20 years and older:  A BMI below 18.5 is considered underweight.  A BMI of 18.5 to 24.9 is normal.  A BMI of 25 to 29.9 is considered overweight.  A BMI  of 30 and above is considered obese.  Maintain normal blood lipids and cholesterol levels by exercising and minimizing your intake of saturated fat. Eat a balanced diet with plenty of fruit and vegetables. Blood tests for lipids and cholesterol should begin at age 50 and be repeated every 5 years. If your lipid or cholesterol levels are high, you are over 50, or you are at high risk for heart disease, you may need your cholesterol levels checked more frequently.Ongoing high lipid and cholesterol levels should be treated with medicines if diet and exercise are not working.  If you smoke, find out from your health care provider how to quit. If you do not use tobacco, do not start.  Lung cancer screening is recommended for adults aged 73-80 years who are at high risk for developing lung cancer because of a history of smoking. A yearly low-dose CT scan of the lungs is recommended for people who have at least a 30-pack-year history of smoking and are a current smoker or have quit within the past 15 years. A pack year of smoking is smoking an average of 1 pack of cigarettes a day for 1 year (for example: 1 pack a day for 30 years or 2 packs a day for 15 years). Yearly screening should continue until the smoker has stopped smoking for at least 15 years. Yearly screening should be stopped for people who develop a health problem that would prevent them from having lung cancer treatment.  If you choose to drink alcohol, do not have more than  2 drinks per day. One drink is considered to be 12 ounces (355 mL) of beer, 5 ounces (148 mL) of wine, or 1.5 ounces (44 mL) of liquor.  Avoid use of street drugs. Do not share needles with anyone. Ask for help if you need support or instructions about stopping the use of drugs.  High blood pressure causes heart disease and increases the risk of stroke. Your blood pressure should be checked at least every 1-2 years. Ongoing high blood pressure should be treated with  medicines, if weight loss and exercise are not effective.  If you are 45-79 years old, ask your health care provider if you should take aspirin to prevent heart disease.  Diabetes screening involves taking a blood sample to check your fasting blood sugar level. This should be done once every 3 years, after age 45, if you are within normal weight and without risk factors for diabetes. Testing should be considered at a younger age or be carried out more frequently if you are overweight and have at least 1 risk factor for diabetes.  Colorectal cancer can be detected and often prevented. Most routine colorectal cancer screening begins at the age of 50 and continues through age 75. However, your health care provider may recommend screening at an earlier age if you have risk factors for colon cancer. On a yearly basis, your health care provider may provide home test kits to check for hidden blood in the stool. Use of a small camera at the end of a tube to directly examine the colon (sigmoidoscopy or colonoscopy) can detect the earliest forms of colorectal cancer. Talk to your health care provider about this at age 50, when routine screening begins. Direct exam of the colon should be repeated every 5-10 years through age 75, unless early forms of precancerous polyps or small growths are found.  People who are at an increased risk for hepatitis B should be screened for this virus. You are considered at high risk for hepatitis B if:  You were born in a country where hepatitis B occurs often. Talk with your health care provider about which countries are considered high risk.  Your parents were born in a high-risk country and you have not received a shot to protect against hepatitis B (hepatitis B vaccine).  You have HIV or AIDS.  You use needles to inject street drugs.  You live with, or have sex with, someone who has hepatitis B.  You are a man who has sex with other men (MSM).  You get hemodialysis  treatment.  You take certain medicines for conditions such as cancer, organ transplantation, and autoimmune conditions.  Hepatitis C blood testing is recommended for all people born from 1945 through 1965 and any individual with known risks for hepatitis C.  Practice safe sex. Use condoms and avoid high-risk sexual practices to reduce the spread of sexually transmitted infections (STIs). STIs include gonorrhea, chlamydia, syphilis, trichomonas, herpes, HPV, and human immunodeficiency virus (HIV). Herpes, HIV, and HPV are viral illnesses that have no cure. They can result in disability, cancer, and death.  If you are at risk of being infected with HIV, it is recommended that you take a prescription medicine daily to prevent HIV infection. This is called preexposure prophylaxis (PrEP). You are considered at risk if:  You are a man who has sex with other men (MSM) and have other risk factors.  You are a heterosexual man, are sexually active, and are at increased risk for HIV infection.    You take drugs by injection.  You are sexually active with a partner who has HIV.  Talk with your health care provider about whether you are at high risk of being infected with HIV. If you choose to begin PrEP, you should first be tested for HIV. You should then be tested every 3 months for as long as you are taking PrEP.  A one-time screening for abdominal aortic aneurysm (AAA) and surgical repair of large AAAs by ultrasound are recommended for men ages 32 to 67 years who are current or former smokers.  Healthy men should no longer receive prostate-specific antigen (PSA) blood tests as part of routine cancer screening. Talk with your health care provider about prostate cancer screening.  Testicular cancer screening is not recommended for adult males who have no symptoms. Screening includes self-exam, a health care provider exam, and other screening tests. Consult with your health care provider about any symptoms  you have or any concerns you have about testicular cancer.  Use sunscreen. Apply sunscreen liberally and repeatedly throughout the day. You should seek shade when your shadow is shorter than you. Protect yourself by wearing long sleeves, pants, a wide-brimmed hat, and sunglasses year round, whenever you are outdoors.  Once a month, do a whole-body skin exam, using a mirror to look at the skin on your back. Tell your health care provider about new moles, moles that have irregular borders, moles that are larger than a pencil eraser, or moles that have changed in shape or color.  Stay current with required vaccines (immunizations).  Influenza vaccine. All adults should be immunized every year.  Tetanus, diphtheria, and acellular pertussis (Td, Tdap) vaccine. An adult who has not previously received Tdap or who does not know his vaccine status should receive 1 dose of Tdap. This initial dose should be followed by tetanus and diphtheria toxoids (Td) booster doses every 10 years. Adults with an unknown or incomplete history of completing a 3-dose immunization series with Td-containing vaccines should begin or complete a primary immunization series including a Tdap dose. Adults should receive a Td booster every 10 years.  Varicella vaccine. An adult without evidence of immunity to varicella should receive 2 doses or a second dose if he has previously received 1 dose.  Human papillomavirus (HPV) vaccine. Males aged 68-21 years who have not received the vaccine previously should receive the 3-dose series. Males aged 22-26 years may be immunized. Immunization is recommended through the age of 6 years for any male who has sex with males and did not get any or all doses earlier. Immunization is recommended for any person with an immunocompromised condition through the age of 49 years if he did not get any or all doses earlier. During the 3-dose series, the second dose should be obtained 4-8 weeks after the first  dose. The third dose should be obtained 24 weeks after the first dose and 16 weeks after the second dose.  Zoster vaccine. One dose is recommended for adults aged 50 years or older unless certain conditions are present.  Measles, mumps, and rubella (MMR) vaccine. Adults born before [birth year] generally are considered immune to measles and mumps. Adults born in [birth year] or later should have 1 or more doses of MMR vaccine unless there is a contraindication to the vaccine or there is laboratory evidence of immunity to each of the three diseases. A routine second dose of MMR vaccine should be obtained at least 28 days after the first dose for students attending postsecondary  schools, health care workers, or international travelers. People who received inactivated measles vaccine or an unknown type of measles vaccine during 1963-1967 should receive 2 doses of MMR vaccine. People who received inactivated mumps vaccine or an unknown type of mumps vaccine before 1979 and are at high risk for mumps infection should consider immunization with 2 doses of MMR vaccine. Unvaccinated health care workers born before 1957 who lack laboratory evidence of measles, mumps, or rubella immunity or laboratory confirmation of disease should consider measles and mumps immunization with 2 doses of MMR vaccine or rubella immunization with 1 dose of MMR vaccine.  Pneumococcal 13-valent conjugate (PCV13) vaccine. When indicated, a person who is uncertain of his immunization history and has no record of immunization should receive the PCV13 vaccine. An adult aged 19 years or older who has certain medical conditions and has not been previously immunized should receive 1 dose of PCV13 vaccine. This PCV13 should be followed with a dose of pneumococcal polysaccharide (PPSV23) vaccine. The PPSV23 vaccine dose should be obtained at least 8 weeks after the dose of PCV13 vaccine. An adult aged 19 years or older who has certain medical conditions and  previously received 1 or more doses of PPSV23 vaccine should receive 1 dose of PCV13. The PCV13 vaccine dose should be obtained 1 or more years after the last PPSV23 vaccine dose.  Pneumococcal polysaccharide (PPSV23) vaccine. When PCV13 is also indicated, PCV13 should be obtained first. All adults aged 65 years and older should be immunized. An adult younger than age 65 years who has certain medical conditions should be immunized. Any person who resides in a nursing home or long-term care facility should be immunized. An adult smoker should be immunized. People with an immunocompromised condition and certain other conditions should receive both PCV13 and PPSV23 vaccines. People with human immunodeficiency virus (HIV) infection should be immunized as soon as possible after diagnosis. Immunization during chemotherapy or radiation therapy should be avoided. Routine use of PPSV23 vaccine is not recommended for American Indians, [place] Natives, or people younger than 65 years unless there are medical conditions that require PPSV23 vaccine. When indicated, people who have unknown immunization and have no record of immunization should receive PPSV23 vaccine. One-time revaccination 5 years after the first dose of PPSV23 is recommended for people aged 19-64 years who have chronic kidney failure, nephrotic syndrome, asplenia, or immunocompromised conditions. People who received 1-2 doses of PPSV23 before age 65 years should receive another dose of PPSV23 vaccine at age 65 years or later if at least 5 years have passed since the previous dose. Doses of PPSV23 are not needed for people immunized with PPSV23 at or after age 65 years.  Meningococcal vaccine. Adults with asplenia or persistent complement component deficiencies should receive 2 doses of quadrivalent meningococcal conjugate (MenACWY-D) vaccine. The doses should be obtained at least 2 months apart. Microbiologists working with certain meningococcal bacteria,  military recruits, people at risk during an outbreak, and people who travel to or live in countries with a high rate of meningitis should be immunized. A first-year college student up through age 21 years who is living in a residence hall should receive a dose if he did not receive a dose on or after his 16th birthday. Adults who have certain high-risk conditions should receive one or more doses of vaccine.  Hepatitis A vaccine. Adults who wish to be protected from this disease, have certain high-risk conditions, work with hepatitis A-infected animals, work in hepatitis A research labs, or   travel to or work in countries with a high rate of hepatitis A should be immunized. Adults who were previously unvaccinated and who anticipate close contact with an international adoptee during the first 60 days after arrival in the [place] from a country with a high rate of hepatitis A should be immunized.  Hepatitis B vaccine. Adults should be immunized if they wish to be protected from this disease, have certain high-risk conditions, may be exposed to blood or other infectious body fluids, are household contacts or sex partners of hepatitis B positive people, are clients or workers in certain care facilities, or travel to or work in countries with a high rate of hepatitis B.  Haemophilus influenzae type b (Hib) vaccine. A previously unvaccinated person with asplenia or sickle cell disease or having a scheduled splenectomy should receive 1 dose of Hib vaccine. Regardless of previous immunization, a recipient of a hematopoietic stem cell transplant should receive a 3-dose series 6-12 months after his successful transplant. Hib vaccine is not recommended for adults with HIV infection. Preventive Service / Frequency Ages 52 to 17  Blood pressure check.** / Every 1 to 2 years.  Lipid and cholesterol check.** / Every 5 years beginning at age 69.  Hepatitis C blood test.** / For any individual with known risks for  hepatitis C.  Skin self-exam. / Monthly.  Influenza vaccine. / Every year.  Tetanus, diphtheria, and acellular pertussis (Tdap, Td) vaccine.** / Consult your health care provider. 1 dose of Td every 10 years.  Varicella vaccine.** / Consult your health care provider.  HPV vaccine. / 3 doses over 6 months, if 72 or younger.  Measles, mumps, rubella (MMR) vaccine.** / You need at least 1 dose of MMR if you were born in 1957 or later. You may also need a second dose.  Pneumococcal 13-valent conjugate (PCV13) vaccine.** / Consult your health care provider.  Pneumococcal polysaccharide (PPSV23) vaccine.** / 1 to 2 doses if you smoke cigarettes or if you have certain conditions.  Meningococcal vaccine.** / 1 dose if you are age 35 to 60 years and a Market researcher living in a residence hall, or have one of several medical conditions. You may also need additional booster doses.  Hepatitis A vaccine.** / Consult your health care provider.  Hepatitis B vaccine.** / Consult your health care provider.  Haemophilus influenzae type b (Hib) vaccine.** / Consult your health care provider. Ages 35 to 8  Blood pressure check.** / Every 1 to 2 years.  Lipid and cholesterol check.** / Every 5 years beginning at age 57.  Lung cancer screening. / Every year if you are aged 44-80 years and have a 30-pack-year history of smoking and currently smoke or have quit within the past 15 years. Yearly screening is stopped once you have quit smoking for at least 15 years or develop a health problem that would prevent you from having lung cancer treatment.  Fecal occult blood test (FOBT) of stool. / Every year beginning at age 55 and continuing until age 73. You may not have to do this test if you get a colonoscopy every 10 years.  Flexible sigmoidoscopy** or colonoscopy.** / Every 5 years for a flexible sigmoidoscopy or every 10 years for a colonoscopy beginning at age 28 and continuing until age  1.  Hepatitis C blood test.** / For all people born from [birth year] through 1965 and any individual with known risks for hepatitis C.  Skin self-exam. / Monthly.  Influenza vaccine. / Every  year.  Tetanus, diphtheria, and acellular pertussis (Tdap/Td) vaccine.** / Consult your health care provider. 1 dose of Td every 10 years.  Varicella vaccine.** / Consult your health care provider.  Zoster vaccine.** / 1 dose for adults aged 1 years or older.  Measles, mumps, rubella (MMR) vaccine.** / You need at least 1 dose of MMR if you were born in 1957 or later. You may also need a second dose.  Pneumococcal 13-valent conjugate (PCV13) vaccine.** / Consult your health care provider.  Pneumococcal polysaccharide (PPSV23) vaccine.** / 1 to 2 doses if you smoke cigarettes or if you have certain conditions.  Meningococcal vaccine.** / Consult your health care provider.  Hepatitis A vaccine.** / Consult your health care provider.  Hepatitis B vaccine.** / Consult your health care provider.  Haemophilus influenzae type b (Hib) vaccine.** / Consult your health care provider. Ages 27 and over  Blood pressure check.** / Every 1 to 2 years.  Lipid and cholesterol check.**/ Every 5 years beginning at age 23.  Lung cancer screening. / Every year if you are aged 24-80 years and have a 30-pack-year history of smoking and currently smoke or have quit within the past 15 years. Yearly screening is stopped once you have quit smoking for at least 15 years or develop a health problem that would prevent you from having lung cancer treatment.  Fecal occult blood test (FOBT) of stool. / Every year beginning at age 100 and continuing until age 22. You may not have to do this test if you get a colonoscopy every 10 years.  Flexible sigmoidoscopy** or colonoscopy.** / Every 5 years for a flexible sigmoidoscopy or every 10 years for a colonoscopy beginning at age 19 and continuing until age 86.  Hepatitis C blood  test.** / For all people born from [birth year] through 1965 and any individual with known risks for hepatitis C.  Abdominal aortic aneurysm (AAA) screening.** / A one-time screening for ages 81 to 50 years who are current or former smokers.  Skin self-exam. / Monthly.  Influenza vaccine. / Every year.  Tetanus, diphtheria, and acellular pertussis (Tdap/Td) vaccine.** / 1 dose of Td every 10 years.  Varicella vaccine.** / Consult your health care provider.  Zoster vaccine.** / 1 dose for adults aged 70 years or older.  Pneumococcal 13-valent conjugate (PCV13) vaccine.** / Consult your health care provider.  Pneumococcal polysaccharide (PPSV23) vaccine.** / 1 dose for all adults aged 69 years and older.  Meningococcal vaccine.** / Consult your health care provider.  Hepatitis A vaccine.** / Consult your health care provider.  Hepatitis B vaccine.** / Consult your health care provider.  Haemophilus influenzae type b (Hib) vaccine.** / Consult your health care provider. **Family history and personal history of risk and conditions may change your health care provider's recommendations. Document Released: 04/12/2001 Document Revised: 02/19/2013 Document Reviewed: 07/12/2010 [facility] Patient Information 2015 [place], [place]. This information is not intended to replace advice given to you by your health care provider. Make sure you discuss any questions you have with your health care provider.   Tremor Tremor is a rhythmic, involuntary muscular contraction characterized by oscillations (to-and-fro movements) of a part of the body. The most common of all involuntary movements, tremor can affect various body parts such as the hands, head, facial structures, vocal cords, trunk, and legs; most tremors, however, occur in the hands. Tremor often accompanies neurological disorders associated with aging. Although the disorder is not life-threatening, it can be responsible for functional disability and social  embarrassment. TREATMENT  There are many types of tremor and several ways in which tremor is classified. The most common classification is by behavioral context or position. There are five categories of tremor within this classification: resting, postural, kinetic, task-specific, and psychogenic. Resting or static tremor occurs when the muscle is at rest, for example when the hands are lying on the lap. This type of tremor is often seen in patients with Parkinson's disease. Postural tremor occurs when a patient attempts to maintain posture, such as holding the hands outstretched. Postural tremors include physiological tremor, essential tremor, tremor with basal ganglia disease (also seen in patients with Parkinson's disease), cerebellar postural tremor, tremor with peripheral neuropathy, post-traumatic tremor, and alcoholic tremor. Kinetic or intention (action) tremor occurs during purposeful movement, for example during finger-to-nose testing. Task-specific tremor appears when performing goal-oriented tasks such as handwriting, speaking, or standing. This group consists of primary writing tremor, vocal tremor, and orthostatic tremor. Psychogenic tremor occurs in both older and younger patients. The key feature of this tremor is that it dramatically lessens or disappears when the patient is distracted. PROGNOSIS There are some treatment options available for tremor; the appropriate treatment depends on accurate diagnosis of the cause. Some tremors respond to treatment of the underlying condition, for example in some cases of psychogenic tremor treating the patient's underlying mental problem may cause the tremor to disappear. Also, patients with tremor due to Parkinson's disease may be treated with Levodopa drug therapy. Symptomatic drug therapy is available for several other tremors as well. For those cases of tremor in which there is no effective drug treatment, physical measures such as teaching the patient  to brace the affected limb during the tremor are sometimes useful. Surgical intervention such as thalamotomy or deep brain stimulation may be useful in certain cases. Document Released: 02/04/2002 Document Revised: 05/09/2011 Document Reviewed: 02/14/2005 [facility] Patient Information 2015 [place], [place]. This information is not intended to replace advice given to you by your health care provider. Make sure you discuss any questions you have with your health care provider.

## 2013-11-06 ENCOUNTER — Other Ambulatory Visit: Payer: Self-pay | Admitting: Family Medicine

## 2013-11-13 MED ORDER — SITAGLIPTIN PHOS-METFORMIN HCL 50-1000 MG PO TABS: 1.0000 | ORAL_TABLET | Freq: Two times a day (BID) | ORAL | Status: DC

## 2014-04-21 ENCOUNTER — Other Ambulatory Visit: Payer: Self-pay | Admitting: Family Medicine

## 2014-04-21 ENCOUNTER — Other Ambulatory Visit: Payer: Self-pay | Admitting: Cardiovascular Disease

## 2014-04-29 ENCOUNTER — Encounter: Payer: Self-pay | Admitting: Internal Medicine

## 2014-05-06 ENCOUNTER — Ambulatory Visit: Payer: 59 | Admitting: Family Medicine

## 2014-05-06 ENCOUNTER — Encounter: Payer: Self-pay | Admitting: Family Medicine

## 2014-05-06 ENCOUNTER — Encounter: Payer: Self-pay | Admitting: Internal Medicine

## 2014-05-06 ENCOUNTER — Ambulatory Visit (INDEPENDENT_AMBULATORY_CARE_PROVIDER_SITE_OTHER): Payer: 59 | Admitting: Family Medicine

## 2014-05-06 VITALS — BP 120/72 | HR 67 | Temp 98.3°F | Wt 286.6 lb

## 2014-05-06 DIAGNOSIS — Z0000 Encounter for general adult medical examination without abnormal findings: Secondary | ICD-10-CM

## 2014-05-06 DIAGNOSIS — E785 Hyperlipidemia, unspecified: Secondary | ICD-10-CM

## 2014-05-06 DIAGNOSIS — I10 Essential (primary) hypertension: Secondary | ICD-10-CM

## 2014-05-06 DIAGNOSIS — IMO0002 Reserved for concepts with insufficient information to code with codable children: Secondary | ICD-10-CM

## 2014-05-06 DIAGNOSIS — E1165 Type 2 diabetes mellitus with hyperglycemia: Secondary | ICD-10-CM

## 2014-05-06 LAB — HEPATIC FUNCTION PANEL
ALT: 15 U/L (ref 0–53)
AST: 14 U/L (ref 0–37)
Albumin: 4.3 g/dL (ref 3.5–5.2)
Alkaline Phosphatase: 44 U/L (ref 39–117)
Bilirubin, Direct: 0.1 mg/dL (ref 0.0–0.3)
Total Bilirubin: 0.4 mg/dL (ref 0.2–1.2)
Total Protein: 7.1 g/dL (ref 6.0–8.3)

## 2014-05-06 LAB — POCT URINALYSIS DIPSTICK
Bilirubin, UA: NEGATIVE
Blood, UA: NEGATIVE
Glucose, UA: NEGATIVE
Ketones, UA: NEGATIVE
Leukocytes, UA: NEGATIVE
Nitrite, UA: NEGATIVE
Spec Grav, UA: >=1.03
Urobilinogen, UA: NEGATIVE
pH, UA: 6

## 2014-05-06 LAB — BASIC METABOLIC PANEL
BUN: 16 mg/dL (ref 6–23)
CO2: 31 mEq/L (ref 19–32)
Calcium: 9.9 mg/dL (ref 8.4–10.5)
Chloride: 103 mEq/L (ref 96–112)
Creatinine, Ser: 0.78 mg/dL (ref 0.40–1.50)
GFR: 106.36 mL/min (ref >60.00)
Glucose, Bld: 181 mg/dL — ABNORMAL HIGH (ref 70–99)
Potassium: 4.2 mEq/L (ref 3.5–5.1)
Sodium: 138 mEq/L (ref 135–145)

## 2014-05-06 LAB — LIPID PANEL
Cholesterol: 163 mg/dL (ref 0–200)
HDL: 59.3 mg/dL (ref >39.00)
LDL Cholesterol: 71 mg/dL (ref 0–99)
NonHDL: 103.7
Total CHOL/HDL Ratio: 3
Triglycerides: 165 mg/dL — ABNORMAL HIGH (ref 0.0–149.0)
VLDL: 33 mg/dL (ref 0.0–40.0)

## 2014-05-06 LAB — MICROALBUMIN / CREATININE URINE RATIO
Creatinine,U: 195.6 mg/dL
Microalb Creat Ratio: 3.2 mg/g (ref 0.0–30.0)
Microalb, Ur: 6.2 mg/dL — ABNORMAL HIGH (ref 0.0–1.9)

## 2014-05-06 LAB — HEMOGLOBIN A1C: Hgb A1c MFr Bld: 8 % — ABNORMAL HIGH (ref 4.6–6.5)

## 2014-05-06 MED ORDER — METFORMIN HCL 1000 MG PO TABS: 1000.0000 mg | ORAL_TABLET | Freq: Two times a day (BID) | ORAL | Status: DC

## 2014-05-06 NOTE — Assessment & Plan Note (Signed)
Stable -- con't lisinopril and coreg

## 2014-05-06 NOTE — Patient Instructions (Signed)
Diabetes and Standards of Medical Care Diabetes is complicated. You may find that your diabetes team includes a dietitian, nurse, diabetes educator, eye doctor, and more. To help everyone know what is going on and to help you get the care you deserve, the following schedule of care was developed to help keep you on track. Below are the tests, exams, vaccines, medicines, education, and plans you will need. HbA1c test This test shows how well you have controlled your glucose over the past 2-3 months. It is used to see if your diabetes management plan needs to be adjusted.   It is performed at least 2 times a year if you are meeting treatment goals.  It is performed 4 times a year if therapy has changed or if you are not meeting treatment goals. Blood pressure test  This test is performed at every routine medical visit. The goal is less than 140/90 mm Hg for most people, but 130/80 mm Hg in some cases. Ask your health care provider about your goal. Dental exam  Follow up with the dentist regularly. Eye exam  If you are diagnosed with type 1 diabetes as a child, get an exam upon reaching the age of 37 years or older and have had diabetes for 3-5 years. Yearly eye exams are recommended after that initial eye exam.  If you are diagnosed with type 1 diabetes as an adult, get an exam within 5 years of diagnosis and then yearly.  If you are diagnosed with type 2 diabetes, get an exam as soon as possible after the diagnosis and then yearly. Foot care exam  Visual foot exams are performed at every routine medical visit. The exams check for cuts, injuries, or other problems with the feet.  A comprehensive foot exam should be done yearly. This includes visual inspection as well as assessing foot pulses and testing for loss of sensation.  Check your feet nightly for cuts, injuries, or other problems with your feet. Tell your health care provider if anything is not healing. Kidney function test (urine  microalbumin)  This test is performed once a year.  Type 1 diabetes: The first test is performed 5 years after diagnosis.  Type 2 diabetes: The first test is performed at the time of diagnosis.  A serum creatinine and estimated glomerular filtration rate (eGFR) test is done once a year to assess the level of chronic kidney disease (CKD), if present. Lipid profile (cholesterol, HDL, LDL, triglycerides)  Performed every 5 years for most people.  The goal for LDL is less than 100 mg/dL. If you are at high risk, the goal is less than 70 mg/dL.  The goal for HDL is 40 mg/dL-50 mg/dL for men and 50 mg/dL-60 mg/dL for women. An HDL cholesterol of 60 mg/dL or higher gives some protection against heart disease.  The goal for triglycerides is less than 150 mg/dL. Influenza vaccine, pneumococcal vaccine, and hepatitis B vaccine  The influenza vaccine is recommended yearly.  It is recommended that people with diabetes who are over 24 years old get the pneumonia vaccine. In some cases, two separate shots may be given. Ask your health care provider if your pneumonia vaccination is up to date.  The hepatitis B vaccine is also recommended for adults with diabetes. Diabetes self-management education  Education is recommended at diagnosis and ongoing as needed. Treatment plan  Your treatment plan is reviewed at every medical visit. Document Released: 12/12/2008 Document Revised: 07/01/2013 Document Reviewed: 07/17/2012 [facility] Patient Information 2015 [place],  LLC. This information is not intended to replace advice given to you by your health care provider. Make sure you discuss any questions you have with your health care provider.  

## 2014-05-06 NOTE — Assessment & Plan Note (Signed)
Pt has not been compliant with meds due to cost He con't to take metformin Check labs

## 2014-05-06 NOTE — Assessment & Plan Note (Signed)
Check labs Con' tzetia and zocor

## 2014-05-06 NOTE — Progress Notes (Signed)
Patient ID: Zachary Michael, male    DOB: 11/12/1950  Age: 64 y.o. MRN: [id number]    Subjective:  Subjective HPI Zachary Michael presents for f/u dm, htn and cholesterol.  HPI HYPERTENSION  Blood pressure range-not checking   Chest pain- no      Dyspnea- no Lightheadedness- no   Edema- no Other side effects - no   Medication compliance: good Low salt diet- yes  DIABETES  Blood Sugar ranges-150 s   Polyuria- no New Visual problems- no Hypoglycemic symptoms- no Other side effects-no Medication compliance - good Last eye exam- 1 year ago Foot exam- today  HYPERLIPIDEMIA  Medication compliance- good RUQ pain- no  Muscle aches- no Other side effects-no   Review of Systems  Constitutional: Negative for diaphoresis, appetite change, fatigue and unexpected weight change.  Eyes: Negative for pain, redness and visual disturbance.  Respiratory: Negative for cough, chest tightness, shortness of breath and wheezing.   Cardiovascular: Negative for chest pain, palpitations and leg swelling.  Endocrine: Negative for cold intolerance, heat intolerance, polydipsia, polyphagia and polyuria.  Genitourinary: Negative for dysuria, frequency and difficulty urinating.  Neurological: Negative for dizziness, light-headedness, numbness and headaches.  Psychiatric/Behavioral: Negative for hallucinations, behavioral problems, confusion, sleep disturbance, self-injury, dysphoric mood, decreased concentration and agitation. The patient is not nervous/anxious and is not hyperactive.     History Past Medical History  Diagnosis Date  . Asthma   . Diabetes mellitus   . HTN (hypertension)   . CAD (coronary artery disease)     Mild  . Hyperlipidemia     He has past surgical history that includes Appendectomy; Tonsillectomy; Tumor removal; Cardiac catheterization; and Cardiovascular stress test.   His family history includes Arthritis in his mother; Cancer in his brother and father; Diabetes in  his maternal aunt and mother; Hypertension in his maternal aunt, maternal aunt, and mother; Lung cancer in an other family member; Osteoporosis in his mother; Pneumonia in his mother.He reports that he has never smoked. He has never used smokeless tobacco. He reports that he does not drink alcohol or use illicit drugs.  Current Outpatient Prescriptions on File Prior to Visit  Medication Sig Dispense Refill  . aspirin 81 MG tablet Take 81 mg by mouth daily.      . carvedilol (COREG) 6.25 MG tablet TAKE 1 TABLET TWICE A DAY WITH MEALS 180 tablet 2  . ezetimibe (ZETIA) 10 MG tablet Take 10 mg by mouth daily.      [name] lisinopril (PRINIVIL,ZESTRIL) 10 MG tablet TAKE 1 TABLET DAILY 90 tablet 0  . nitroGLYCERIN (NITROSTAT) 0.4 MG SL tablet Place 1 tablet (0.4 mg total) under the tongue every 5 (five) minutes as needed for chest pain. 25 tablet 5  . pioglitazone (ACTOS) 45 MG tablet TAKE 1 TABLET DAILY 90 tablet 1  . simvastatin (ZOCOR) 40 MG tablet TAKE 1 TABLET AT BEDTIME 90 tablet 2   No current facility-administered medications on file prior to visit.     Objective:  Objective Physical Exam  Constitutional: He is oriented to person, place, and time. Vital signs are normal. He appears well-developed and well-nourished. He is sleeping.  HENT:  Head: Normocephalic and atraumatic.  Mouth/Throat: Oropharynx is clear and moist.  Eyes: EOM are normal. Pupils are equal, round, and reactive to light.  Neck: Normal range of motion. Neck supple. No thyromegaly present.  Cardiovascular: Normal rate and regular rhythm.   No murmur heard. Pulmonary/Chest: Effort normal and breath sounds normal. No respiratory distress.  He has no wheezes. He has no rales. He exhibits no tenderness.  Musculoskeletal: He exhibits no edema or tenderness.  Neurological: He is alert and oriented to person, place, and time.  Skin: Skin is warm and dry.  Psychiatric: He has a normal mood and affect. His behavior is normal.  Judgment and thought content normal.  Sensory exam of the foot is normal, tested with the monofilament. Good pulses, no lesions or ulcers, good peripheral pulses.  BP 120/72 mmHg  Pulse 67  Temp(Src) 98.3 F (36.8 C) (Oral)  Wt 286 lb 9.6 oz (130.001 kg)  SpO2 97% Wt Readings from Last 3 Encounters:  05/06/14 286 lb 9.6 oz (130.001 kg)  10/31/13 283 lb 4.7 oz (128.5 kg)  06/03/13 290 lb 12.8 oz (131.906 kg)     Lab Results  Component Value Date   WBC 5.9 10/31/2013   HGB 12.8* 10/31/2013   HCT 37.4* 10/31/2013   PLT 216.0 10/31/2013   GLUCOSE 158* 10/31/2013   CHOL 162 10/31/2013   TRIG 113.0 10/31/2013   HDL 53.70 10/31/2013   LDLDIRECT 198.6 03/20/2009   LDLCALC 86 10/31/2013   ALT 16 10/31/2013   AST 16 10/31/2013   NA 137 10/31/2013   K 4.2 10/31/2013   CL 102 10/31/2013   CREATININE 0.8 10/31/2013   BUN 15 10/31/2013   CO2 28 10/31/2013   TSH 0.98 09/27/2011   PSA 2.69 10/31/2013   INR 1.0 06/02/2008   HGBA1C 7.9* 10/31/2013   MICROALBUR 4.3* 10/31/2013    Nm Bone Scan Whole Body  04/03/2009   Clinical Data: Abnormal appearance of the posterior aspect of the right fifth rib on chest x-ray dated 06/02/2008. Left upper chest pain.   NUCLEAR MEDICINE WHOLE BODY BONE SCINTIGRAPHY   Technique:  Whole body anterior and posterior images were obtained approximately 3 hours after intravenous injection of radiopharmaceutical.   Radiopharmaceutical: 25 mCi of technetium 4549m MDP   Comparison: None.   Findings: The bone scan demonstrates slight asymmetric  activity in the first costochondral junction on the right as compared to the left.  This is felt to account for the density overlying the posterior aspect of the right fifth rib on the chest x-ray.  The density is  actually probably coming from the overlying first costochondral junction.   This is not felt to be significant.  The asymmetry is minimal.   The remainder of the bone scan is normal.   IMPRESSION:   1.  No significant  abnormality on bone scan. 2.  Minimal asymmetry of the first costochondral junction.  This is felt to account for the density that overlies the right fifth rib on chest x-ray since the first costochondral junction also overlies the right fifth rib. 3.  No further evaluation of this finding on chest x-ray is recommended.  Provider: Arminda Residesheryl Gravley, Hannah BeatMelinda Berry, Victorino DecemberAmber Pitts, Shanin Stedge    Assessment & Plan:  Plan I have discontinued Mr. Christinia GullyDaly's sitaGLIPtin-metformin. I have also changed his metFORMIN. Additionally, I am having him maintain his ezetimibe, aspirin, nitroGLYCERIN, carvedilol, simvastatin, lisinopril, and pioglitazone.  Meds ordered this encounter  Medications  . metFORMIN (GLUCOPHAGE) 1000 MG tablet    Sig: Take 1 tablet (1,000 mg total) by mouth 2 (two) times daily with a meal.    Dispense:  60 tablet    Refill:  1    Problem List Items Addressed This Visit      Unprioritized   Preventative health care   Relevant Orders  Ambulatory referral to Gastroenterology   Hypertension    Stable -- con't lisinopril and coreg      Relevant Orders   Basic metabolic panel   Microalbumin / creatinine urine ratio   POCT urinalysis dipstick   Hyperlipidemia    Check labs Con' tzetia and zocor       Diabetes mellitus type II, uncontrolled - Primary    Pt has not been compliant with meds due to cost He con't to take metformin Check labs      Relevant Medications   metFORMIN (GLUCOPHAGE) tablet   Other Relevant Orders   Basic metabolic panel   Hemoglobin A1c   Microalbumin / creatinine urine ratio   POCT urinalysis dipstick    Other Visit Diagnoses    Hyperlipidemia LDL goal <70        Relevant Orders    Hepatic function panel    Lipid panel       Follow-up: Return in about 6 months (around 11/06/2014), or if symptoms worsen or fail to improve, for f/u labs and ov.  Loreen Freud, DO

## 2014-05-06 NOTE — Progress Notes (Signed)
Pre visit review using our clinic review tool, if applicable. No additional management support is needed unless otherwise documented below in the visit note. 

## 2014-05-08 ENCOUNTER — Ambulatory Visit: Payer: 59 | Admitting: Family Medicine

## 2014-05-09 ENCOUNTER — Telehealth: Payer: Self-pay | Admitting: Family Medicine

## 2014-05-09 ENCOUNTER — Other Ambulatory Visit: Payer: Self-pay | Admitting: Cardiovascular Disease

## 2014-05-09 NOTE — Telephone Encounter (Signed)
Caller name: Hale DroneDaly, Townes P Relation to pt: self  Call back number: [phone]   Reason for call:  pt returning your call regarding lab results please advise

## 2014-05-12 ENCOUNTER — Other Ambulatory Visit: Payer: Self-pay | Admitting: Family Medicine

## 2014-05-12 DIAGNOSIS — E1165 Type 2 diabetes mellitus with hyperglycemia: Secondary | ICD-10-CM

## 2014-05-12 DIAGNOSIS — E785 Hyperlipidemia, unspecified: Secondary | ICD-10-CM

## 2014-05-12 DIAGNOSIS — IMO0002 Reserved for concepts with insufficient information to code with codable children: Secondary | ICD-10-CM

## 2014-05-12 MED ORDER — GLIMEPIRIDE 2 MG PO TABS: 2.0000 mg | ORAL_TABLET | Freq: Every day | ORAL | Status: DC

## 2014-05-12 MED ORDER — FENOFIBRATE 160 MG PO TABS: 160.0000 mg | ORAL_TABLET | Freq: Every day | ORAL | Status: DC

## 2014-05-12 NOTE — Telephone Encounter (Signed)
Patient informed of results and instructions per result note dated 05/06/14

## 2014-06-04 ENCOUNTER — Other Ambulatory Visit: Payer: Self-pay

## 2014-06-04 MED ORDER — GLIMEPIRIDE 2 MG PO TABS: 2.0000 mg | ORAL_TABLET | Freq: Every day | ORAL | Status: DC

## 2014-06-04 MED ORDER — FENOFIBRATE 160 MG PO TABS: 160.0000 mg | ORAL_TABLET | Freq: Every day | ORAL | Status: DC

## 2014-07-14 ENCOUNTER — Other Ambulatory Visit: Payer: Self-pay | Admitting: Cardiovascular Disease

## 2014-07-25 ENCOUNTER — Encounter: Payer: Self-pay | Admitting: Internal Medicine

## 2014-07-25 ENCOUNTER — Ambulatory Visit (INDEPENDENT_AMBULATORY_CARE_PROVIDER_SITE_OTHER): Payer: 59 | Admitting: Internal Medicine

## 2014-07-25 VITALS — BP 124/62 | HR 72 | Temp 97.9°F | Ht 69.5 in | Wt 290.5 lb

## 2014-07-25 DIAGNOSIS — M722 Plantar fascial fibromatosis: Secondary | ICD-10-CM

## 2014-07-25 NOTE — Progress Notes (Signed)
Subjective:    Patient ID: Zachary Michael, male    DOB: 07/28/1950, 64 y.o.   MRN: [id number]  DOS:  07/25/2014 Type of visit - description : acute Interval history: Symptoms started about a year ago on and off pain at the right or left heel, for the last 3 weeks the pain has been steady at the left heel, much worse and intense when he tries to walk.    Review of Systems Denies any injury, swelling, redness of the heels. 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  Past Medical History  Diagnosis Date  . Asthma   . Diabetes mellitus   . HTN (hypertension)   . CAD (coronary artery disease)     Mild  . Hyperlipidemia     Past Surgical History  Procedure Laterality Date  . Appendectomy    . Tonsillectomy    . Tumor removal      LEFT ARM  . Cardiac catheterization      We recently performed a which revealed minor coronary artery irregularities. He had normal left ventricle systolic function with an EF of 60%  . Cardiovascular stress test      EF 63%    History   Social History  . Marital Status: Married    Spouse Name: N/A  . Number of Children: 3  . Years of Education: N/A   Occupational History  . [employer]    Social History Main Topics  . Smoking status: Never Smoker   . Smokeless tobacco: Never Used  . Alcohol Use: No  . Drug Use: No  . Sexual Activity:    Partners: Female   Other Topics Concern  . Not on file   Social History Narrative   Exercise-- walking at work        Medication List       This list is accurate as of: 07/25/14 11:59 PM.  Always use your most recent med list.               aspirin 81 MG tablet  Take 81 mg by mouth daily.     carvedilol 6.25 MG tablet  Commonly known as:  COREG  TAKE 1 TABLET TWICE A DAY WITH MEALS     ezetimibe 10 MG tablet  Commonly known as:  ZETIA  Take 10 mg by mouth daily.     fenofibrate 160 MG tablet  Take 1 tablet  (160 mg total) by mouth daily.     glimepiride 2 MG tablet  Commonly known as:  AMARYL  Take 1 tablet (2 mg total) by mouth daily.     lisinopril 10 MG tablet  Commonly known as:  PRINIVIL,ZESTRIL  TAKE 1 TABLET DAILY (CALL AND SCHEDULE APPOINTMENT)     metFORMIN 1000 MG tablet  Commonly known as:  GLUCOPHAGE  Take 1 tablet (1,000 mg total) by mouth 2 (two) times daily with a meal.     nitroGLYCERIN 0.4 MG SL tablet  Commonly known as:  NITROSTAT  Place 1 tablet (0.4 mg total) under the tongue every 5 (five) minutes as needed for chest pain.     pioglitazone 45 MG tablet  Commonly known as:  ACTOS  TAKE 1 TABLET DAILY     simvastatin 40 MG tablet  Commonly known as:  ZOCOR  TAKE 1 TABLET AT BEDTIME           Objective:   Physical Exam  BP 124/62 mmHg  Pulse 72  Temp(Src) 97.9 F (36.6 C) (Oral)  Ht 5' 9.5" (1.765 m)  Wt 290 lb 8 oz (131.77 kg)  BMI 42.30 kg/m2  SpO2 97%  General:   Well developed, well nourished . NAD.  HEENT:  Normocephalic . Face symmetric, atraumatic  Skin: Bilateral lower pretibial edema with chronic changes consistent of thin skin with hyperpigmentation.   MSK: Plantar area bilaterally without redness, swelling. No TTP. However the patient has a very difficult time walking when he put pressure on the left foot ( pain at the heel) Neurologic:  alert & oriented X3.  Speech normal  Psych--  Cognition and judgment appear intact.  Cooperative with normal attention span and concentration.  Behavior appropriate. No anxious or depressed appearing.       Assessment & Plan:    Plantar fasciitis Symptoms consistent with above, recommend stretching, icing, use of a shoe insert. If not better will let me know

## 2014-07-25 NOTE — Patient Instructions (Signed)
Stretching Ice  the area Get a heel support shoe insert  Call if not improving in the next 2 or 3 weeks  Plantar Fasciitis Plantar fasciitis is a common condition that causes foot pain. It is soreness (inflammation) of the band of tough fibrous tissue on the bottom of the foot that runs from the heel bone (calcaneus) to the ball of the foot. The cause of this soreness may be from excessive standing, poor fitting shoes, running on hard surfaces, being overweight, having an abnormal walk, or overuse (this is common in runners) of the painful foot or feet. It is also common in aerobic exercise dancers and ballet dancers. SYMPTOMS  Most people with plantar fasciitis complain of:  Severe pain in the morning on the bottom of their foot especially when taking the first steps out of bed. This pain recedes after a few minutes of walking.  Severe pain is experienced also during walking following a long period of inactivity.  Pain is worse when walking barefoot or up stairs DIAGNOSIS   Your caregiver will diagnose this condition by examining and feeling your foot.  Special tests such as X-rays of your foot, are usually not needed. PREVENTION   Consult a sports medicine professional before beginning a new exercise program.  Walking programs offer a good workout. With walking there is a lower chance of overuse injuries common to runners. There is less impact and less jarring of the joints.  Begin all new exercise programs slowly. If problems or pain develop, decrease the amount of time or distance until you are at a comfortable level.  Wear good shoes and replace them regularly.  Stretch your foot and the heel cords at the back of the ankle (Achilles tendon) both before and after exercise.  Run or exercise on even surfaces that are not hard. For example, asphalt is better than pavement.  Do not run barefoot on hard surfaces.  If using a treadmill, vary the incline.  Do not continue to  workout if you have foot or joint problems. Seek professional help if they do not improve. HOME CARE INSTRUCTIONS   Avoid activities that cause you pain until you recover.  Use ice or cold packs on the problem or painful areas after working out.  Only take over-the-counter or prescription medicines for pain, discomfort, or fever as directed by your caregiver.  Soft shoe inserts or athletic shoes with air or gel sole cushions may be helpful.  If problems continue or become more severe, consult a sports medicine caregiver or your own health care provider. Cortisone is a potent anti-inflammatory medication that may be injected into the painful area. You can discuss this treatment with your caregiver. MAKE SURE YOU:   Understand these instructions.  Will watch your condition.  Will get help right away if you are not doing well or get worse. Document Released: 11/09/2000 Document Revised: 05/09/2011 Document Reviewed: 01/09/2008 [facility] Patient Information 2015 [place]. This information is not intended to replace advice given to you by your health care provider. Make sure you discuss any questions you have with your health care provider.

## 2014-07-25 NOTE — Progress Notes (Signed)
Pre visit review using our clinic review tool, if applicable. No additional management support is needed unless otherwise documented below in the visit note. 

## 2014-08-14 ENCOUNTER — Other Ambulatory Visit (INDEPENDENT_AMBULATORY_CARE_PROVIDER_SITE_OTHER): Payer: 59

## 2014-08-14 DIAGNOSIS — E785 Hyperlipidemia, unspecified: Secondary | ICD-10-CM | POA: Diagnosis not present

## 2014-08-14 DIAGNOSIS — E1165 Type 2 diabetes mellitus with hyperglycemia: Secondary | ICD-10-CM

## 2014-08-14 DIAGNOSIS — IMO0002 Reserved for concepts with insufficient information to code with codable children: Secondary | ICD-10-CM

## 2014-08-14 LAB — HEPATIC FUNCTION PANEL
ALT: 15 U/L (ref 0–53)
AST: 16 U/L (ref 0–37)
Albumin: 4.2 g/dL (ref 3.5–5.2)
Alkaline Phosphatase: 26 U/L — ABNORMAL LOW (ref 39–117)
Bilirubin, Direct: 0.1 mg/dL (ref 0.0–0.3)
Total Bilirubin: 0.4 mg/dL (ref 0.2–1.2)
Total Protein: 6.7 g/dL (ref 6.0–8.3)

## 2014-08-14 LAB — BASIC METABOLIC PANEL
BUN: 17 mg/dL (ref 6–23)
CO2: 27 mEq/L (ref 19–32)
Calcium: 9.5 mg/dL (ref 8.4–10.5)
Chloride: 105 mEq/L (ref 96–112)
Creatinine, Ser: 0.97 mg/dL (ref 0.40–1.50)
GFR: 82.63 mL/min (ref >60.00)
Glucose, Bld: 105 mg/dL — ABNORMAL HIGH (ref 70–99)
Potassium: 4.4 mEq/L (ref 3.5–5.1)
Sodium: 138 mEq/L (ref 135–145)

## 2014-08-14 LAB — LIPID PANEL
Cholesterol: 130 mg/dL (ref 0–200)
HDL: 60.7 mg/dL (ref >39.00)
LDL Cholesterol: 57 mg/dL (ref 0–99)
NonHDL: 69.3
Total CHOL/HDL Ratio: 2
Triglycerides: 62 mg/dL (ref 0.0–149.0)
VLDL: 12.4 mg/dL (ref 0.0–40.0)

## 2014-08-14 LAB — HEMOGLOBIN A1C: Hgb A1c MFr Bld: 6.6 % — ABNORMAL HIGH (ref 4.6–6.5)

## 2014-09-24 ENCOUNTER — Other Ambulatory Visit: Payer: Self-pay | Admitting: Cardiovascular Disease

## 2014-09-24 ENCOUNTER — Other Ambulatory Visit: Payer: Self-pay | Admitting: Family Medicine

## 2014-11-06 ENCOUNTER — Encounter: Payer: Self-pay | Admitting: Family Medicine

## 2014-11-06 ENCOUNTER — Ambulatory Visit (HOSPITAL_BASED_OUTPATIENT_CLINIC_OR_DEPARTMENT_OTHER)
Admission: RE | Admit: 2014-11-06 | Discharge: 2014-11-06 | Disposition: A | Discharge status: Home / Self Care | Payer: 59 | Source: Ambulatory Visit | Attending: Family Medicine | Admitting: Family Medicine

## 2014-11-06 ENCOUNTER — Ambulatory Visit (INDEPENDENT_AMBULATORY_CARE_PROVIDER_SITE_OTHER): Payer: 59 | Admitting: Family Medicine

## 2014-11-06 VITALS — BP 110/60 | HR 72 | Temp 98.3°F | Wt 298.4 lb

## 2014-11-06 DIAGNOSIS — IMO0002 Reserved for concepts with insufficient information to code with codable children: Secondary | ICD-10-CM

## 2014-11-06 DIAGNOSIS — R05 Cough: Secondary | ICD-10-CM | POA: Insufficient documentation

## 2014-11-06 DIAGNOSIS — E1165 Type 2 diabetes mellitus with hyperglycemia: Secondary | ICD-10-CM

## 2014-11-06 DIAGNOSIS — I10 Essential (primary) hypertension: Secondary | ICD-10-CM | POA: Diagnosis not present

## 2014-11-06 DIAGNOSIS — E1159 Type 2 diabetes mellitus with other circulatory complications: Secondary | ICD-10-CM | POA: Diagnosis not present

## 2014-11-06 DIAGNOSIS — J209 Acute bronchitis, unspecified: Secondary | ICD-10-CM | POA: Diagnosis not present

## 2014-11-06 DIAGNOSIS — E785 Hyperlipidemia, unspecified: Secondary | ICD-10-CM

## 2014-11-06 DIAGNOSIS — R062 Wheezing: Secondary | ICD-10-CM | POA: Diagnosis not present

## 2014-11-06 DIAGNOSIS — G250 Essential tremor: Secondary | ICD-10-CM

## 2014-11-06 DIAGNOSIS — E1151 Type 2 diabetes mellitus with diabetic peripheral angiopathy without gangrene: Secondary | ICD-10-CM

## 2014-11-06 LAB — POCT URINALYSIS DIPSTICK
Bilirubin, UA: NEGATIVE
Blood, UA: NEGATIVE
Glucose, UA: NEGATIVE
Ketones, UA: NEGATIVE
Leukocytes, UA: NEGATIVE
Nitrite, UA: NEGATIVE
Protein, UA: NEGATIVE
Spec Grav, UA: 1.02
Urobilinogen, UA: 0.2
pH, UA: 7

## 2014-11-06 LAB — CBC WITH DIFFERENTIAL/PLATELET
Basophils Absolute: 0 10*3/uL (ref 0.0–0.1)
Basophils Relative: 0.6 % (ref 0.0–3.0)
Eosinophils Absolute: 0.5 10*3/uL (ref 0.0–0.7)
Eosinophils Relative: 8.2 % — ABNORMAL HIGH (ref 0.0–5.0)
HCT: 37.3 % — ABNORMAL LOW (ref 39.0–52.0)
Hemoglobin: 12.6 g/dL — ABNORMAL LOW (ref 13.0–17.0)
Lymphocytes Relative: 21.4 % (ref 12.0–46.0)
Lymphs Abs: 1.4 10*3/uL (ref 0.7–4.0)
MCHC: 33.8 g/dL (ref 30.0–36.0)
MCV: 89.4 fl (ref 78.0–100.0)
Monocytes Absolute: 0.6 10*3/uL (ref 0.1–1.0)
Monocytes Relative: 8.5 % (ref 3.0–12.0)
Neutro Abs: 4 10*3/uL (ref 1.4–7.7)
Neutrophils Relative %: 61.3 % (ref 43.0–77.0)
Platelets: 213 10*3/uL (ref 150.0–400.0)
RBC: 4.17 Mil/uL — ABNORMAL LOW (ref 4.22–5.81)
RDW: 13.5 % (ref 11.5–15.5)
WBC: 6.5 10*3/uL (ref 4.0–10.5)

## 2014-11-06 LAB — COMPREHENSIVE METABOLIC PANEL
ALT: 16 U/L (ref 0–53)
AST: 19 U/L (ref 0–37)
Albumin: 4.4 g/dL (ref 3.5–5.2)
Alkaline Phosphatase: 25 U/L — ABNORMAL LOW (ref 39–117)
BUN: 22 mg/dL (ref 6–23)
CO2: 28 mEq/L (ref 19–32)
Calcium: 10.4 mg/dL (ref 8.4–10.5)
Chloride: 104 mEq/L (ref 96–112)
Creatinine, Ser: 1.08 mg/dL (ref 0.40–1.50)
GFR: 72.95 mL/min (ref >60.00)
Glucose, Bld: 90 mg/dL (ref 70–99)
Potassium: 4.2 mEq/L (ref 3.5–5.1)
Sodium: 139 mEq/L (ref 135–145)
Total Bilirubin: 0.4 mg/dL (ref 0.2–1.2)
Total Protein: 6.9 g/dL (ref 6.0–8.3)

## 2014-11-06 LAB — LIPID PANEL
Cholesterol: 139 mg/dL (ref 0–200)
HDL: 66.6 mg/dL (ref >39.00)
LDL Cholesterol: 59 mg/dL (ref 0–99)
NonHDL: 72.83
Total CHOL/HDL Ratio: 2
Triglycerides: 68 mg/dL (ref 0.0–149.0)
VLDL: 13.6 mg/dL (ref 0.0–40.0)

## 2014-11-06 LAB — MICROALBUMIN / CREATININE URINE RATIO
Creatinine,U: 136.4 mg/dL
Microalb Creat Ratio: 0.9 mg/g (ref 0.0–30.0)
Microalb, Ur: 1.2 mg/dL (ref 0.0–1.9)

## 2014-11-06 LAB — HEMOGLOBIN A1C: Hgb A1c MFr Bld: 6.5 % (ref 4.6–6.5)

## 2014-11-06 MED ORDER — AZITHROMYCIN 250 MG PO TABS: ORAL_TABLET | ORAL | Status: DC

## 2014-11-06 MED ORDER — ALBUTEROL SULFATE HFA 108 (90 BASE) MCG/ACT IN AERS: 2.0000 | INHALATION_SPRAY | Freq: Four times a day (QID) | RESPIRATORY_TRACT | Status: DC | PRN

## 2014-11-06 NOTE — Patient Instructions (Signed)

## 2014-11-06 NOTE — Assessment & Plan Note (Signed)
Lisinopril  Stable Recheck 6 months

## 2014-11-06 NOTE — Assessment & Plan Note (Signed)
Check labs con't actos. glimeprimide and metformin

## 2014-11-06 NOTE — Progress Notes (Signed)
Patient ID: Zachary Michael, male    DOB: 09-01-1950  Age: 64 y.o. MRN: [id number]    Subjective:  Subjective HPI Zachary Michael presents for pt here f/u dm, htn, and cholesterol.  He is also complaining of a tremor both at rest and when reaching out to do something.  It has worsened since winter.    HPI HYPERTENSION  Blood pressure range-not checking   Chest pain- no      Dyspnea- no Lightheadedness- no   Edema- no change Other side effects -no   Medication compliance: good  Low salt diet- yes  DIABETES  Blood Sugar ranges- 90s   Polyuria- no New Visual problems- no Hypoglycemic symptoms- no Other side effects-no Medication compliance - good Last eye exam- 07/2014 and will see him again in Oct Foot exam- today  HYPERLIPIDEMIA  Medication compliance- good RUQ pain- no  Muscle aches- no Other side effects-no  ROS See HPI above   PMH Smoking Status noted     Review of Systems  Constitutional: Negative for diaphoresis, appetite change, fatigue and unexpected weight change.  Eyes: Negative for pain, redness and visual disturbance.  Respiratory: Negative for cough, chest tightness, shortness of breath and wheezing.   Cardiovascular: Negative for chest pain, palpitations and leg swelling.  Endocrine: Negative for cold intolerance, heat intolerance, polydipsia, polyphagia and polyuria.  Genitourinary: Negative for dysuria, frequency and difficulty urinating.  Neurological: Negative for dizziness, light-headedness, numbness and headaches.  Psychiatric/Behavioral: Negative for decreased concentration. The patient is not nervous/anxious.     History Past Medical History  Diagnosis Date  . Asthma   . Diabetes mellitus   . HTN (hypertension)   . CAD (coronary artery disease)     Mild  . Hyperlipidemia     He has past surgical history that includes Appendectomy; Tonsillectomy; Tumor removal; Cardiac catheterization; and Cardiovascular stress test.   His family  history includes Arthritis in his mother; Cancer in his brother and father; Diabetes in his maternal aunt and mother; Hypertension in his maternal aunt, maternal aunt, and mother; Lung cancer in an other family member; Osteoporosis in his mother; Pneumonia in his mother.He reports that he has never smoked. He has never used smokeless tobacco. He reports that he does not drink alcohol or use illicit drugs.  Current Outpatient Prescriptions on File Prior to Visit  Medication Sig Dispense Refill  . aspirin 81 MG tablet Take 81 mg by mouth daily.      . carvedilol (COREG) 6.25 MG tablet TAKE 1 TABLET TWICE A DAY WITH MEALS (NEEDS AN APPOINTMENT) 180 tablet 0  . ezetimibe (ZETIA) 10 MG tablet Take 10 mg by mouth daily.      . fenofibrate 160 MG tablet Take 1 tablet (160 mg total) by mouth daily. 90 tablet 1  . glimepiride (AMARYL) 2 MG tablet Take 1 tablet (2 mg total) by mouth daily. 90 tablet 1  . lisinopril (PRINIVIL,ZESTRIL) 10 MG tablet TAKE 1 TABLET DAILY (CALL AND SCHEDULE APPOINTMENT) 90 tablet 3  . metFORMIN (GLUCOPHAGE) 1000 MG tablet Take 1 tablet (1,000 mg total) by mouth 2 (two) times daily with a meal. 180 tablet 1  . pioglitazone (ACTOS) 45 MG tablet TAKE 1 TABLET DAILY 90 tablet 1  . simvastatin (ZOCOR) 40 MG tablet TAKE 1 TABLET AT BEDTIME 90 tablet 1  . nitroGLYCERIN (NITROSTAT) 0.4 MG SL tablet Place 1 tablet (0.4 mg total) under the tongue every 5 (five) minutes as needed for chest pain. (Patient not taking: Reported  on 11/06/2014) 25 tablet 5   No current facility-administered medications on file prior to visit.     Objective:  Objective Physical Exam  Constitutional: He is oriented to person, place, and time. Vital signs are normal. He appears well-developed and well-nourished. He is sleeping.  HENT:  Head: Normocephalic and atraumatic.  Mouth/Throat: Oropharynx is clear and moist.  Eyes: EOM are normal. Pupils are equal, round, and reactive to light.  Neck: Normal range of  motion. Neck supple. No thyromegaly present.  Cardiovascular: Normal rate and regular rhythm.   No murmur heard. Pulmonary/Chest: Effort normal and breath sounds normal. No respiratory distress. He has no wheezes. He has no rales. He exhibits no tenderness.  Musculoskeletal: He exhibits no edema or tenderness.  Neurological: He is alert and oriented to person, place, and time.  Skin: Skin is warm and dry.  Psychiatric: He has a normal mood and affect. His behavior is normal. Judgment and thought content normal.  Sensory exam of the foot is normal, tested with the monofilament. Good pulses, no lesions or ulcers, good peripheral pulses.  BP 110/60 mmHg  Pulse 72  Temp(Src) 98.3 F (36.8 C) (Oral)  Wt 298 lb 6.4 oz (135.353 kg)  SpO2 98% Wt Readings from Last 3 Encounters:  11/06/14 298 lb 6.4 oz (135.353 kg)  07/25/14 290 lb 8 oz (131.77 kg)  05/06/14 286 lb 9.6 oz (130.001 kg)     Lab Results  Component Value Date   WBC 6.5 11/06/2014   HGB 12.6* 11/06/2014   HCT 37.3* 11/06/2014   PLT 213.0 11/06/2014   GLUCOSE 90 11/06/2014   CHOL 139 11/06/2014   TRIG 68.0 11/06/2014   HDL 66.60 11/06/2014   LDLDIRECT 198.6 03/20/2009   LDLCALC 59 11/06/2014   ALT 16 11/06/2014   AST 19 11/06/2014   NA 139 11/06/2014   K 4.2 11/06/2014   CL 104 11/06/2014   CREATININE 1.08 11/06/2014   BUN 22 11/06/2014   CO2 28 11/06/2014   TSH 0.98 09/27/2011   PSA 2.69 10/31/2013   INR 1.0 06/02/2008   HGBA1C 6.5 11/06/2014   MICROALBUR 1.2 11/06/2014    Nm Bone Scan Whole Body  04/03/2009   Clinical Data: Abnormal appearance of the posterior aspect of the right fifth rib on chest x-ray dated 06/02/2008. Left upper chest pain.   NUCLEAR MEDICINE WHOLE BODY BONE SCINTIGRAPHY   Technique:  Whole body anterior and posterior images were obtained approximately 3 hours after intravenous injection of radiopharmaceutical.   Radiopharmaceutical: 25 mCi of technetium 37mMDP   Comparison: None.    Findings: The bone scan demonstrates slight asymmetric  activity in the first costochondral junction on the right as compared to the left.  This is felt to account for the density overlying the posterior aspect of the right fifth rib on the chest x-ray.  The density is  actually probably coming from the overlying first costochondral junction.   This is not felt to be significant.  The asymmetry is minimal.   The remainder of the bone scan is normal.   IMPRESSION:   1.  No significant abnormality on bone scan. 2.  Minimal asymmetry of the first costochondral junction.  This is felt to account for the density that overlies the right fifth rib on chest x-ray since the first costochondral junction also overlies the right fifth rib. 3.  No further evaluation of this finding on chest x-ray is recommended.  Provider: CBurnetta Sabin MYvette Rack [address] [place]  Assessment & Plan:  Plan I am having Zachary Michael start on albuterol. I am also having him maintain his ezetimibe, aspirin, nitroGLYCERIN, pioglitazone, fenofibrate, glimepiride, lisinopril, simvastatin, metFORMIN, carvedilol, and azithromycin.  Meds ordered this encounter  Medications  . DISCONTD: azithromycin (ZITHROMAX Z-PAK) 250 MG tablet    Sig: As directed    Dispense:  6 each    Refill:  0  . albuterol (PROAIR HFA) 108 (90 BASE) MCG/ACT inhaler    Sig: Inhale 2 puffs into the lungs every 6 (six) hours as needed for wheezing or shortness of breath.    Dispense:  1 Inhaler    Refill:  4  . azithromycin (ZITHROMAX Z-PAK) 250 MG tablet    Sig: As directed    Dispense:  6 each    Refill:  0    Problem List Items Addressed This Visit      Unprioritized   Hyperlipidemia   Relevant Orders   CBC with Differential/Platelet (Completed)   Hemoglobin A1c (Completed)   Lipid panel (Completed)   Microalbumin / creatinine urine ratio (Completed)   POCT urinalysis dipstick (Completed)   Comp Met (CMET) (Completed)    Other Visit  Diagnoses    Essential hypertension    -  Primary    Relevant Orders    CBC with Differential/Platelet (Completed)    Hemoglobin A1c (Completed)    Lipid panel (Completed)    Microalbumin / creatinine urine ratio (Completed)    POCT urinalysis dipstick (Completed)    Comp Met (CMET) (Completed)    DM (diabetes mellitus) type II uncontrolled, periph vascular disorder        Relevant Orders    CBC with Differential/Platelet (Completed)    Hemoglobin A1c (Completed)    Lipid panel (Completed)    Microalbumin / creatinine urine ratio (Completed)    POCT urinalysis dipstick (Completed)    Comp Met (CMET) (Completed)    Acute bronchitis, unspecified organism        Relevant Medications    albuterol (PROAIR HFA) 108 (90 BASE) MCG/ACT inhaler    azithromycin (ZITHROMAX Z-PAK) 250 MG tablet    Other Relevant Orders    DG Chest 2 View (Completed)    Essential tremor        Relevant Orders    Ambulatory referral to Neurology       Follow-up: Return in about 6 months (around 05/06/2015), or if symptoms worsen or fail to improve, for annual exam, fasting.  Garnet Koyanagi, DO

## 2014-11-06 NOTE — Progress Notes (Signed)
Pre visit review using our clinic review tool, if applicable. No additional management support is needed unless otherwise documented below in the visit note. 

## 2014-11-06 NOTE — Assessment & Plan Note (Signed)
Zetia, fenofibate and simvastatin Check labs

## 2014-11-26 ENCOUNTER — Other Ambulatory Visit: Payer: Self-pay | Admitting: Family Medicine

## 2015-01-09 ENCOUNTER — Other Ambulatory Visit: Payer: Self-pay | Admitting: Cardiovascular Disease

## 2015-01-09 ENCOUNTER — Other Ambulatory Visit: Payer: Self-pay

## 2015-01-09 MED ORDER — CARVEDILOL 6.25 MG PO TABS: ORAL_TABLET | ORAL | Status: DC

## 2015-01-09 MED ORDER — CARVEDILOL 6.25 MG PO TABS
ORAL_TABLET | ORAL | Status: DC
Start: 2015-01-09 — End: 2015-01-09

## 2015-01-12 ENCOUNTER — Telehealth: Payer: Self-pay | Admitting: Cardiovascular Disease

## 2015-01-12 MED ORDER — CARVEDILOL 6.25 MG PO TABS: ORAL_TABLET | ORAL | Status: DC

## 2015-01-12 NOTE — Telephone Encounter (Signed)
°*  STAT* If patient is at the pharmacy, call can be transferred to refill team.   1. Which medications need to be refilled? (please list name of each medication and dose if known) Carvedilol 6.25mg   2. Which pharmacy/location (including street and city if local pharmacy) is medication to be sent to?[facility]  3. Do they need a 30 day or 90 day supply? 30day

## 2015-01-12 NOTE — Telephone Encounter (Signed)
Pt's medication sent to pt's requested pharmacy. Confirmation received °

## 2015-01-13 ENCOUNTER — Other Ambulatory Visit: Payer: Self-pay | Admitting: Family Medicine

## 2015-01-19 ENCOUNTER — Ambulatory Visit (INDEPENDENT_AMBULATORY_CARE_PROVIDER_SITE_OTHER): Payer: 59 | Admitting: Cardiovascular Disease

## 2015-01-19 ENCOUNTER — Encounter: Payer: Self-pay | Admitting: Cardiovascular Disease

## 2015-01-19 VITALS — BP 110/76 | HR 68 | Ht 69.5 in | Wt 305.8 lb

## 2015-01-19 DIAGNOSIS — I10 Essential (primary) hypertension: Secondary | ICD-10-CM | POA: Diagnosis not present

## 2015-01-19 DIAGNOSIS — E785 Hyperlipidemia, unspecified: Secondary | ICD-10-CM | POA: Diagnosis not present

## 2015-01-19 NOTE — Patient Instructions (Addendum)
Medication Instructions:  Your physician recommends that you continue on your current medications as directed. Please refer to the Current Medication list given to you today.   Labwork: None Ordered   Testing/Procedures: None Ordered   Follow-Up: Your physician wants you to follow-up in: 1 year with Dr. Nahser.  You will receive a reminder letter in the mail two months in advance. If you don't receive a letter, please call our office to schedule the follow-up appointment.   If you need a refill on your cardiac medications before your next appointment, please call your pharmacy.   For your  leg edema you  should do  the following 1. Leg elevation - I recommend the Lounge Dr. Leg rest.  See below for details  2. Salt restriction  -  Use potassium chloride instead of regular salt as a salt substitute. 3. Walk regularly 4. Compression hose - [facility] 5. Weight loss     Go to [email or web address]       Thank you for choosing [facility]! Michelle Swinyer, RN [phone]   

## 2015-01-19 NOTE — Progress Notes (Signed)
Zachary Michael Date of Birth  May 06, 1950 [facility] [address]    [address] [place], [place]  [address] [phone]  Fax  [phone]   Problem List 1. Hypertension -  2. Hyperlipidemia - 3. CAD - 4. Diabetes Mellitus  History of Present Illness:  Zachary Michael is a 64 year old gentleman with a history of hypertension, hyperlipidemia, mild coronary artery disease, and diabetes mellitus.  He's done well since I last saw him. He's not had any episodes of chest pain or shortness of breath.  June 03, 2013:   Zachary Michael has retired since I last saw him.  He is doing well.  Occasional palpitations.  No syncope, no pre-syncope,  Sounds clinically like  a PVC.  Still eating salty foods.  Not exercising much, doing chores around the house - no regular exercise.   Not eating out as much.   Nov. 21, 2016  BP looks great.   Walking 1 1/2 miles a day .   No CP , no significant palpitations  Leg edema persists,  Still eats some salt.   Current Outpatient Prescriptions on File Prior to Visit  Medication Sig Dispense Refill  . albuterol (PROAIR HFA) 108 (90 BASE) MCG/ACT inhaler Inhale 2 puffs into the lungs every 6 (six) hours as needed for wheezing or shortness of breath. 1 Inhaler 4  . aspirin 81 MG tablet Take 81 mg by mouth daily.      [name] azithromycin (ZITHROMAX Z-PAK) 250 MG tablet As directed 6 each 0  . carvedilol (COREG) 6.25 MG tablet TAKE 1 TABLET TWICE A DAY WITH MEALS 60 tablet 0  . ezetimibe (ZETIA) 10 MG tablet Take 10 mg by mouth daily.      . fenofibrate 160 MG tablet TAKE 1 TABLET DAILY 90 tablet 1  . glimepiride (AMARYL) 2 MG tablet TAKE 1 TABLET DAILY 90 tablet 1  . lisinopril (PRINIVIL,ZESTRIL) 10 MG tablet TAKE 1 TABLET DAILY (CALL AND SCHEDULE APPOINTMENT) 90 tablet 3  . metFORMIN (GLUCOPHAGE) 1000 MG tablet Take 1 tablet (1,000 mg total) by mouth 2 (two) times daily with a meal. 180 tablet 1  . nitroGLYCERIN (NITROSTAT) 0.4 MG SL tablet Place 1 tablet (0.4 mg total)  under the tongue every 5 (five) minutes as needed for chest pain. 25 tablet 5  . pioglitazone (ACTOS) 45 MG tablet TAKE 1 TABLET DAILY 90 tablet 1  . simvastatin (ZOCOR) 40 MG tablet TAKE 1 TABLET AT BEDTIME 90 tablet 1   No current facility-administered medications on file prior to visit.    No Known Allergies  Past Medical History  Diagnosis Date  . Asthma   . Diabetes mellitus   . HTN (hypertension)   . CAD (coronary artery disease)     Mild  . Hyperlipidemia     Past Surgical History  Procedure Laterality Date  . Appendectomy    . Tonsillectomy    . Tumor removal      LEFT ARM  . Cardiac catheterization      We recently performed a which revealed minor coronary artery irregularities. He had normal left ventricle systolic function with an EF of 60%  . Cardiovascular stress test      EF 63%    History  Smoking status  . Never Smoker   Smokeless tobacco  . Never Used    History  Alcohol Use No    Family History  Problem Relation Age of Onset  . Lung cancer    . Pneumonia Mother   .  Osteoporosis Mother   . Arthritis Mother   . Diabetes Mother   . Hypertension Mother   . Cancer Father     leukemia, lung  . Cancer Brother   . Diabetes Maternal Aunt   . Hypertension Maternal Aunt   . Hypertension Maternal Aunt     Reviw of Systems:  Reviewed in the HPI.  All other systems are negative.  Physical Exam: BP 110/76 mmHg  Pulse 68  Ht 5' 9.5" (1.765 m)  Wt 305 lb 12.8 oz (138.71 kg)  BMI 44.53 kg/m2 The patient is alert and oriented x 3.  The mood and affect are normal.   Skin: warm and dry.  Color is normal.   HEENT:   [place]/AT, no JVD, normal carotids Lungs: clear   Heart: RR, no murmurs   Abdomen: soft, + BS, non tender, mildly obese Extremities:   1-2 + edema, chronic stasis changes.  Neuro:  Non focal, CN intact, gait is normal   ECG: 01/19/2015: Normal sinus rhythm at 68. He has a left anterior fascicular block.  Assessment / Plan:   1.  Hypertension - Bp is well controlled.   2. Hyperlipidemia - followed by primary   3. CAD - minor irregularities in his right coronary artery. Otherwise his coronary arteries were normal. He had a stress test during which he had nonsustained ventricular tachycardia.  4. Diabetes Mellitus    Nahser, Deloris PingPhilip J, MD  01/19/2015 5:24 PM    [facility] [address],  [address] [place], [place]  [address] Pager [phone] Phone: [phone]; Fax: [phone]   [facility]  [address] [place], [place]  [address] [phone]   Fax [phone]) [phone]

## 2015-02-11 ENCOUNTER — Other Ambulatory Visit: Payer: Self-pay | Admitting: Cardiovascular Disease

## 2015-02-11 NOTE — Telephone Encounter (Signed)
Per last office visit, hyperlipidemia followed by pcp. Ok to refill, or defer to their office?

## 2015-02-11 NOTE — Telephone Encounter (Signed)
Refer to PCP please

## 2015-02-13 ENCOUNTER — Other Ambulatory Visit: Payer: Self-pay

## 2015-02-13 MED ORDER — SIMVASTATIN 40 MG PO TABS: 40.0000 mg | ORAL_TABLET | Freq: Every day | ORAL | Status: DC

## 2015-03-30 ENCOUNTER — Other Ambulatory Visit: Payer: Self-pay | Admitting: Family Medicine

## 2015-05-11 ENCOUNTER — Encounter: Payer: Self-pay | Admitting: Family Medicine

## 2015-05-11 ENCOUNTER — Ambulatory Visit (INDEPENDENT_AMBULATORY_CARE_PROVIDER_SITE_OTHER): Payer: Medicare Other | Admitting: Family Medicine

## 2015-05-11 VITALS — BP 118/72 | HR 69 | Temp 98.3°F | Ht 69.25 in | Wt 307.4 lb

## 2015-05-11 DIAGNOSIS — I10 Essential (primary) hypertension: Secondary | ICD-10-CM | POA: Diagnosis not present

## 2015-05-11 DIAGNOSIS — Z23 Encounter for immunization: Secondary | ICD-10-CM

## 2015-05-11 DIAGNOSIS — E785 Hyperlipidemia, unspecified: Secondary | ICD-10-CM | POA: Diagnosis not present

## 2015-05-11 DIAGNOSIS — Z1159 Encounter for screening for other viral diseases: Secondary | ICD-10-CM

## 2015-05-11 DIAGNOSIS — E118 Type 2 diabetes mellitus with unspecified complications: Secondary | ICD-10-CM

## 2015-05-11 DIAGNOSIS — Z125 Encounter for screening for malignant neoplasm of prostate: Secondary | ICD-10-CM | POA: Diagnosis not present

## 2015-05-11 DIAGNOSIS — Z0000 Encounter for general adult medical examination without abnormal findings: Secondary | ICD-10-CM | POA: Diagnosis not present

## 2015-05-11 DIAGNOSIS — Z114 Encounter for screening for human immunodeficiency virus [HIV]: Secondary | ICD-10-CM

## 2015-05-11 LAB — CBC WITH DIFFERENTIAL/PLATELET
Basophils Absolute: 0 10*3/uL (ref 0.0–0.1)
Basophils Relative: 0.6 % (ref 0.0–3.0)
Eosinophils Absolute: 0.3 10*3/uL (ref 0.0–0.7)
Eosinophils Relative: 4.3 % (ref 0.0–5.0)
HCT: 36.3 % — ABNORMAL LOW (ref 39.0–52.0)
Hemoglobin: 12.3 g/dL — ABNORMAL LOW (ref 13.0–17.0)
Lymphocytes Relative: 20.5 % (ref 12.0–46.0)
Lymphs Abs: 1.2 10*3/uL (ref 0.7–4.0)
MCHC: 33.8 g/dL (ref 30.0–36.0)
MCV: 87.7 fl (ref 78.0–100.0)
Monocytes Absolute: 0.5 10*3/uL (ref 0.1–1.0)
Monocytes Relative: 8.3 % (ref 3.0–12.0)
Neutro Abs: 4 10*3/uL (ref 1.4–7.7)
Neutrophils Relative %: 66.3 % (ref 43.0–77.0)
Platelets: 250 10*3/uL (ref 150.0–400.0)
RBC: 4.15 Mil/uL — ABNORMAL LOW (ref 4.22–5.81)
RDW: 14.2 % (ref 11.5–15.5)
WBC: 6 10*3/uL (ref 4.0–10.5)

## 2015-05-11 LAB — COMPREHENSIVE METABOLIC PANEL
ALT: 15 U/L (ref 0–53)
AST: 18 U/L (ref 0–37)
Albumin: 4.5 g/dL (ref 3.5–5.2)
Alkaline Phosphatase: 25 U/L — ABNORMAL LOW (ref 39–117)
BUN: 19 mg/dL (ref 6–23)
CO2: 25 mEq/L (ref 19–32)
Calcium: 9.8 mg/dL (ref 8.4–10.5)
Chloride: 106 mEq/L (ref 96–112)
Creatinine, Ser: 1.04 mg/dL (ref 0.40–1.50)
GFR: 76.07 mL/min (ref >60.00)
Glucose, Bld: 108 mg/dL — ABNORMAL HIGH (ref 70–99)
Potassium: 4.5 mEq/L (ref 3.5–5.1)
Sodium: 140 mEq/L (ref 135–145)
Total Bilirubin: 0.4 mg/dL (ref 0.2–1.2)
Total Protein: 6.8 g/dL (ref 6.0–8.3)

## 2015-05-11 LAB — POC URINALSYSI DIPSTICK (AUTOMATED)
Bilirubin, UA: NEGATIVE
Blood, UA: NEGATIVE
Glucose, UA: NEGATIVE
Ketones, UA: NEGATIVE
Leukocytes, UA: NEGATIVE
Nitrite, UA: NEGATIVE
Protein, UA: NEGATIVE
Spec Grav, UA: 1.02
Urobilinogen, UA: 0.2
pH, UA: 6

## 2015-05-11 LAB — LIPID PANEL
Cholesterol: 145 mg/dL (ref 0–200)
HDL: 65 mg/dL (ref >39.00)
LDL Cholesterol: 65 mg/dL (ref 0–99)
NonHDL: 79.52
Total CHOL/HDL Ratio: 2
Triglycerides: 71 mg/dL (ref 0.0–149.0)
VLDL: 14.2 mg/dL (ref 0.0–40.0)

## 2015-05-11 LAB — HEMOGLOBIN A1C: Hgb A1c MFr Bld: 6.8 % — ABNORMAL HIGH (ref 4.6–6.5)

## 2015-05-11 LAB — HIV ANTIBODY (ROUTINE TESTING W REFLEX): HIV 1&2 Ab, 4th Generation: NONREACTIVE

## 2015-05-11 LAB — PSA: PSA: 2.46 ng/mL (ref 0.10–4.00)

## 2015-05-11 NOTE — Patient Instructions (Signed)

## 2015-05-11 NOTE — Progress Notes (Signed)
Pre visit review using our clinic review tool, if applicable. No additional management support is needed unless otherwise documented below in the visit note. 

## 2015-05-11 NOTE — Progress Notes (Signed)
Subjective:   Zachary Michael is a 65 y.o. male who presents for a Welcome to Medicare exam.   Review of Systems:  Review of Systems  Constitutional: Negative for activity change, appetite change and fatigue.  HENT: Negative for hearing loss, congestion, tinnitus and ear discharge.   Eyes: Negative for visual disturbance (see optho q1y -- vision corrected to 20/20 with glasses).  Respiratory: Negative for cough, chest tightness and shortness of breath.   Cardiovascular: Negative for chest pain, palpitations and leg swelling.  Gastrointestinal: Negative for abdominal pain, diarrhea, constipation and abdominal distention.  Genitourinary: Negative for urgency, frequency, decreased urine volume and difficulty urinating.  Musculoskeletal: Negative for back pain, arthralgias and gait problem.  Skin: Negative for color change, pallor and rash.  Neurological: Negative for dizziness, light-headedness, numbness and headaches.  Hematological: Negative for adenopathy. Does not bruise/bleed easily.  Psychiatric/Behavioral: Negative for suicidal ideas, confusion, sleep disturbance, self-injury, dysphoric mood, decreased concentration and agitation.  Pt is able to read and write and can do all ADLs No risk for falling No abuse/ violence in home           Objective:    Today's Vitals   05/11/15 0832  BP: 118/72  Pulse: 69  Temp: 98.3 F (36.8 C)  TempSrc: Oral  Height: 5' 9.25" (1.759 m)  Weight: 307 lb 6.4 oz (139.436 kg)  SpO2: 98%   Body mass index is 45.07 kg/(m^2). BP 118/72 mmHg  Pulse 69  Temp(Src) 98.3 F (36.8 C) (Oral)  Ht 5' 9.25" (1.759 m)  Wt 307 lb 6.4 oz (139.436 kg)  BMI 45.07 kg/m2  SpO2 98% General appearance: alert, cooperative, appears stated age and no distress Head: Normocephalic, without obvious abnormality, atraumatic Eyes: conjunctivae/corneas clear. PERRL, EOM's intact. Fundi benign. Ears: normal TM's and external ear canals both ears Nose: Nares  normal. Septum midline. Mucosa normal. No drainage or sinus tenderness. Throat: lips, mucosa, and tongue normal; teeth and gums normal Neck: no adenopathy, no carotid bruit, no JVD, supple, symmetrical, trachea midline and thyroid not enlarged, symmetric, no tenderness/mass/nodules Back: symmetric, no curvature. ROM normal. No CVA tenderness. Lungs: clear to auscultation bilaterally Chest wall: no tenderness Heart: S1, S2 normal Abdomen: soft, non-tender; bowel sounds normal; no masses,  no organomegaly Male genitalia: normal, penis: no lesions or discharge. testes: no masses or tenderness. no hernias Rectal: normal tone, normal prostate, no masses or tenderness and soft brown guaiac negative stool noted Extremities: extremities normal, atraumatic, no cyanosis or edema Pulses: 2+ and symmetric Skin: Skin color, texture, turgor normal. No rashes or lesions Lymph nodes: Cervical, supraclavicular, and axillary nodes normal. Neurologic: Alert and oriented X 3, normal strength and tone. Normal symmetric reflexes. Normal coordination and gait Psych- no depression, no anxiety Medications Outpatient Encounter Prescriptions as of 05/11/2015  Medication Sig  . albuterol (PROAIR HFA) 108 (90 BASE) MCG/ACT inhaler Inhale 2 puffs into the lungs every 6 (six) hours as needed for wheezing or shortness of breath.  [name] aspirin 81 MG tablet Take 81 mg by mouth daily.    . carvedilol (COREG) 6.25 MG tablet TAKE 1 TABLET TWICE A DAY WITH MEALS  . carvedilol (COREG) 6.25 MG tablet Take 1 tablet (6.25 mg total) by mouth 2 (two) times daily with a meal.  . ezetimibe (ZETIA) 10 MG tablet Take 10 mg by mouth daily.    . fenofibrate 160 MG tablet TAKE 1 TABLET DAILY  . glimepiride (AMARYL) 2 MG tablet TAKE 1 TABLET DAILY  .  lisinopril (PRINIVIL,ZESTRIL) 10 MG tablet TAKE 1 TABLET DAILY (CALL AND SCHEDULE APPOINTMENT)  . metFORMIN (GLUCOPHAGE) 1000 MG tablet TAKE 1 TABLET TWICE A DAY WITH MEALS  . nitroGLYCERIN  (NITROSTAT) 0.4 MG SL tablet Place 1 tablet (0.4 mg total) under the tongue every 5 (five) minutes as needed for chest pain.  . pioglitazone (ACTOS) 45 MG tablet TAKE 1 TABLET DAILY  . simvastatin (ZOCOR) 40 MG tablet Take 1 tablet (40 mg total) by mouth at bedtime.  . [DISCONTINUED] azithromycin (ZITHROMAX Z-PAK) 250 MG tablet As directed   No facility-administered encounter medications on file as of 05/11/2015.     History: Past Medical History  Diagnosis Date  . Asthma   . Diabetes mellitus   . HTN (hypertension)   . CAD (coronary artery disease)     Mild  . Hyperlipidemia    Past Surgical History  Procedure Laterality Date  . Appendectomy    . Tonsillectomy    . Tumor removal      LEFT ARM  . Cardiac catheterization      We recently performed a which revealed minor coronary artery irregularities. He had normal left ventricle systolic function with an EF of 60%  . Cardiovascular stress test      EF 63%    Family History  Problem Relation Age of Onset  . Lung cancer    . Pneumonia Mother   . Osteoporosis Mother   . Arthritis Mother   . Diabetes Mother   . Hypertension Mother   . Cancer Father     leukemia, lung  . Cancer Brother   . Diabetes Maternal Aunt   . Hypertension Maternal Aunt   . Hypertension Maternal Aunt    Social History   Occupational History  . [employer]    Social History Main Topics  . Smoking status: Never Smoker   . Smokeless tobacco: Never Used  . Alcohol Use: No  . Drug Use: No  . Sexual Activity:    Partners: Female   Tobacco Counseling Counseling given: Not Answered   Immunizations and Health Maintenance Immunization History  Administered Date(s) Administered  . Pneumococcal Conjugate-13 05/11/2015  . Pneumococcal Polysaccharide-23 01/07/2013  . Tdap 09/23/2010  . Zoster 09/23/2010   Health Maintenance Due  Topic Date Due  . Hepatitis C Screening  05/26/1950  . HIV Screening  11/29/1964  . FOOT EXAM  05/06/2015  .  HEMOGLOBIN A1C  05/06/2015    Activities of Daily Living In your present state of health, do you have any difficulty performing the following activities: 05/11/2015  Hearing? N  Vision? N  Difficulty concentrating or making decisions? Y  Walking or climbing stairs? N  Dressing or bathing? N  Doing errands, shopping? N     Advanced Directives:none---  He knows they need to work on this--- he states they are planning to work on that       Assessment:    This is a routine wellness  examination for this patient .  Vision/Hearing screen Hearing -- whisper normal Vision-- per opth  Dietary issues and exercise activities discussed: walk when weather is nicee    Goals    None      Depression Screen PHQ 2/9 Scores 05/11/2015 10/30/2012  PHQ - 2 Score 0 0     Fall Risk Fall Risk  05/11/2015  Falls in the past year? Yes  Number falls in past yr: 1  Injury with Fall? Yes    MMSE:30/30  Patient Care  Team: Rosalita Chessman, DO as PCP - General     Plan:    see AVS  During the course of the visit,Nickalos was educated and counseled about the following appropriate screening and preventive services:   Vaccines to include Pneumoccal, Influenza, Hepatitis B, Td, Zostavax, HCV  Electrocardiogram  Cardiovascular Disease  Colorectal cancer screening  Diabetes screening  Glaucoma screening  Nutrition counseling  Prostate cancer screening  Smoking cessation counseling  His current medications and allergies were reviewed and needed refills of his chronic medications were ordered. The plan for yearly health maintenance was discussed all orders and referrals were made as appropriate.  Patient Instructions (the written plan) was given to the patient.  1. Need for pneumococcal vaccination  - Pneumococcal conjugate vaccine 13-valent IM  2. Preventative health care Check labs See above ghm utd - PSA  3. Controlled type 2 diabetes mellitus with complication, without  long-term current use of insulin ([place]) .con't glimepride and metformin and actos - Hemoglobin A1c - POCT Urinalysis Dipstick (Automated) - Comp Met (CMET) - CBC with Differential/Platelet - Lipid panel  4. Essential hypertension Lisinopril and coreg - CBC w/Diff - CBC with Differential/Platelet - Lipid panel  5. Hyperlipidemia con't fenofibrate, zetia and zocor - CBC with Differential/Platelet - Lipid panel  6. Screening for HIV (human immunodeficiency virus)  - HIV antibody  7. Need for hepatitis C screening test   8. Routine history and physical examination of adult   Garnet Koyanagi, DO 05/11/2015

## 2015-05-31 ENCOUNTER — Other Ambulatory Visit: Payer: Self-pay | Admitting: Family Medicine

## 2015-07-06 ENCOUNTER — Other Ambulatory Visit: Payer: Self-pay | Admitting: Family Medicine

## 2015-09-21 ENCOUNTER — Other Ambulatory Visit: Payer: Self-pay

## 2015-09-21 MED ORDER — CARVEDILOL 6.25 MG PO TABS: 6.2500 mg | ORAL_TABLET | Freq: Two times a day (BID) | ORAL | 0 refills | Status: DC

## 2015-09-21 NOTE — Telephone Encounter (Signed)
Zachary Michael   carvedilol (COREG) 6.25 MG tablet TAKE 1 TABLET TWICE A DAY WITH MEALS   Patient Instructions   Medication Instructions:  Your physician recommends that you continue on your current medications as directed. Please refer to the Current Medication list given to you today.

## 2015-09-25 ENCOUNTER — Other Ambulatory Visit: Payer: Self-pay | Admitting: Cardiovascular Disease

## 2015-11-06 ENCOUNTER — Other Ambulatory Visit: Payer: Self-pay | Admitting: Cardiovascular Disease

## 2015-11-12 ENCOUNTER — Ambulatory Visit (INDEPENDENT_AMBULATORY_CARE_PROVIDER_SITE_OTHER): Payer: Medicare Other | Admitting: Family Medicine

## 2015-11-12 ENCOUNTER — Ambulatory Visit: Payer: 59 | Admitting: Family Medicine

## 2015-11-12 ENCOUNTER — Encounter: Payer: Self-pay | Admitting: Family Medicine

## 2015-11-12 VITALS — BP 122/58 | HR 64 | Temp 97.9°F | Resp 17 | Ht 69.0 in | Wt 302.6 lb

## 2015-11-12 DIAGNOSIS — I10 Essential (primary) hypertension: Secondary | ICD-10-CM | POA: Diagnosis not present

## 2015-11-12 DIAGNOSIS — E118 Type 2 diabetes mellitus with unspecified complications: Secondary | ICD-10-CM

## 2015-11-12 DIAGNOSIS — IMO0002 Reserved for concepts with insufficient information to code with codable children: Secondary | ICD-10-CM

## 2015-11-12 DIAGNOSIS — E785 Hyperlipidemia, unspecified: Secondary | ICD-10-CM

## 2015-11-12 DIAGNOSIS — E1165 Type 2 diabetes mellitus with hyperglycemia: Secondary | ICD-10-CM | POA: Diagnosis not present

## 2015-11-12 LAB — LIPID PANEL
Cholesterol: 124 mg/dL (ref 0–200)
HDL: 60.2 mg/dL (ref >39.00)
LDL Cholesterol: 51 mg/dL (ref 0–99)
NonHDL: 64.23
Total CHOL/HDL Ratio: 2
Triglycerides: 68 mg/dL (ref 0.0–149.0)
VLDL: 13.6 mg/dL (ref 0.0–40.0)

## 2015-11-12 LAB — COMPREHENSIVE METABOLIC PANEL
ALT: 15 U/L (ref 0–53)
AST: 17 U/L (ref 0–37)
Albumin: 4.1 g/dL (ref 3.5–5.2)
Alkaline Phosphatase: 25 U/L — ABNORMAL LOW (ref 39–117)
BUN: 18 mg/dL (ref 6–23)
CO2: 30 mEq/L (ref 19–32)
Calcium: 9.4 mg/dL (ref 8.4–10.5)
Chloride: 104 mEq/L (ref 96–112)
Creatinine, Ser: 1.01 mg/dL (ref 0.40–1.50)
GFR: 78.56 mL/min (ref >60.00)
Glucose, Bld: 90 mg/dL (ref 70–99)
Potassium: 4.9 mEq/L (ref 3.5–5.1)
Sodium: 138 mEq/L (ref 135–145)
Total Bilirubin: 0.4 mg/dL (ref 0.2–1.2)
Total Protein: 6.8 g/dL (ref 6.0–8.3)

## 2015-11-12 LAB — POCT URINALYSIS DIPSTICK
Bilirubin, UA: NEGATIVE
Blood, UA: NEGATIVE
Glucose, UA: NEGATIVE
Ketones, UA: NEGATIVE
Leukocytes, UA: NEGATIVE
Nitrite, UA: NEGATIVE
Protein, UA: NEGATIVE
Spec Grav, UA: 1.025
Urobilinogen, UA: 0.2
pH, UA: 7

## 2015-11-12 LAB — HEMOGLOBIN A1C: Hgb A1c MFr Bld: 6.4 % (ref 4.6–6.5)

## 2015-11-12 MED ORDER — LISINOPRIL 10 MG PO TABS: ORAL_TABLET | ORAL | 0 refills | Status: DC

## 2015-11-12 MED ORDER — SIMVASTATIN 40 MG PO TABS: 40.0000 mg | ORAL_TABLET | Freq: Every day | ORAL | 1 refills | Status: DC

## 2015-11-12 MED ORDER — FENOFIBRATE 160 MG PO TABS: 160.0000 mg | ORAL_TABLET | Freq: Every day | ORAL | 1 refills | Status: DC

## 2015-11-12 MED ORDER — CARVEDILOL 6.25 MG PO TABS: 6.2500 mg | ORAL_TABLET | Freq: Two times a day (BID) | ORAL | 1 refills | Status: DC

## 2015-11-12 MED ORDER — PIOGLITAZONE HCL 45 MG PO TABS: 45.0000 mg | ORAL_TABLET | Freq: Every day | ORAL | 1 refills | Status: DC

## 2015-11-12 MED ORDER — CARVEDILOL 6.25 MG PO TABS: ORAL_TABLET | ORAL | 0 refills | Status: DC

## 2015-11-12 MED ORDER — EZETIMIBE 10 MG PO TABS: 10.0000 mg | ORAL_TABLET | Freq: Every day | ORAL | Status: DC

## 2015-11-12 MED ORDER — GLIMEPIRIDE 2 MG PO TABS: 2.0000 mg | ORAL_TABLET | Freq: Every day | ORAL | 1 refills | Status: DC

## 2015-11-12 MED ORDER — METFORMIN HCL 1000 MG PO TABS: 1000.0000 mg | ORAL_TABLET | Freq: Two times a day (BID) | ORAL | 1 refills | Status: DC

## 2015-11-12 NOTE — Assessment & Plan Note (Signed)
Check labs con't meds 

## 2015-11-12 NOTE — Progress Notes (Signed)
Patient ID: Zachary Michael, male    DOB: 1950-12-13  Age: 65 y.o. MRN: [id number]    Subjective:  Subjective  HPI Zachary Michael presents for f/u dm, cholesterol and bp,   HPI HYPERTENSION   Blood pressure range-good  Chest pain- no      Dyspnea- no Lightheadedness- no   Edema- no  Other side effects - no   Medication compliance: good Low salt diet- no    DIABETES    Blood Sugar ranges-82-126  Polyuria- no New Visual problems- no  Hypoglycemic symptoms- no  Other side effects-no Medication compliance - good Last eye exam- due Foot exam- today   HYPERLIPIDEMIA  Medication compliance- good RUQ pain- no  Muscle aches- no Other side effects-no   ROS See HPI above   PMH Smoking Status noted      Review of Systems  Constitutional: Negative for appetite change, diaphoresis, fatigue and unexpected weight change.  Eyes: Negative for pain, redness and visual disturbance.  Respiratory: Negative for cough, chest tightness, shortness of breath and wheezing.   Cardiovascular: Negative for chest pain, palpitations and leg swelling.  Endocrine: Negative for cold intolerance, heat intolerance, polydipsia, polyphagia and polyuria.  Genitourinary: Negative for difficulty urinating, dysuria and frequency.  Neurological: Negative for dizziness, light-headedness, numbness and headaches.    History Past Medical History:  Diagnosis Date  . Asthma   . CAD (coronary artery disease)    Mild  . Diabetes mellitus   . HTN (hypertension)   . Hyperlipidemia     He has a past surgical history that includes Appendectomy; Tonsillectomy; Tumor removal; Cardiac catheterization; and Cardiovascular stress test.   His family history includes Arthritis in his mother; Cancer in his brother and father; Diabetes in his maternal aunt and mother; Hypertension in his maternal aunt, maternal aunt, and mother; Osteoporosis in his mother; Pneumonia in his mother.He reports that he has never smoked. He  has never used smokeless tobacco. He reports that he does not drink alcohol or use drugs.  Current Outpatient Prescriptions on File Prior to Visit  Medication Sig Dispense Refill  . albuterol (PROAIR HFA) 108 (90 BASE) MCG/ACT inhaler Inhale 2 puffs into the lungs every 6 (six) hours as needed for wheezing or shortness of breath. 1 Inhaler 4  . aspirin 81 MG tablet Take 81 mg by mouth daily.      . nitroGLYCERIN (NITROSTAT) 0.4 MG SL tablet Place 1 tablet (0.4 mg total) under the tongue every 5 (five) minutes as needed for chest pain. 25 tablet 5   No current facility-administered medications on file prior to visit.      Objective:  Objective  Physical Exam  Constitutional: He is oriented to person, place, and time. Vital signs are normal. He appears well-developed and well-nourished. He is sleeping.  HENT:  Head: Normocephalic and atraumatic.  Mouth/Throat: Oropharynx is clear and moist.  Eyes: EOM are normal. Pupils are equal, round, and reactive to light.  Neck: Normal range of motion. Neck supple. No thyromegaly present.  Cardiovascular: Normal rate and regular rhythm.   No murmur heard. Pulmonary/Chest: Effort normal and breath sounds normal. No respiratory distress. He has no wheezes. He has no rales. He exhibits no tenderness.  Musculoskeletal: He exhibits no edema or tenderness.  Neurological: He is alert and oriented to person, place, and time.  Skin: Skin is warm and dry.  Psychiatric: He has a normal mood and affect. His behavior is normal. Judgment and thought content normal.  Nursing note  and vitals reviewed. Sensory exam of the foot is normal, tested with the monofilament. Good pulses, no lesions or ulcers, good peripheral pulses. BP (!) 122/58 (BP Location: Right Arm, Patient Position: Sitting, Cuff Size: Large)   Pulse 64   Temp 97.9 F (36.6 C) (Oral)   Resp 17   Ht 5\' 9"  (1.753 m)   Wt (!) 302 lb 9.6 oz (137.3 kg)   SpO2 97%   BMI 44.69 kg/m  Wt Readings from  Last 3 Encounters:  11/12/15 (!) 302 lb 9.6 oz (137.3 kg)  05/11/15 (!) 307 lb 6.4 oz (139.4 kg)  01/19/15 (!) 305 lb 12.8 oz (138.7 kg)     Lab Results  Component Value Date   WBC 6.0 05/11/2015   HGB 12.3 (L) 05/11/2015   HCT 36.3 (L) 05/11/2015   PLT 250.0 05/11/2015   GLUCOSE 90 11/12/2015   CHOL 124 11/12/2015   TRIG 68.0 11/12/2015   HDL 60.20 11/12/2015   LDLDIRECT 198.6 03/20/2009   LDLCALC 51 11/12/2015   ALT 15 11/12/2015   AST 17 11/12/2015   NA 138 11/12/2015   K 4.9 11/12/2015   CL 104 11/12/2015   CREATININE 1.01 11/12/2015   BUN 18 11/12/2015   CO2 30 11/12/2015   TSH 0.98 09/27/2011   PSA 2.46 05/11/2015   INR 1.0 06/02/2008   HGBA1C 6.4 11/12/2015   MICROALBUR 1.2 11/06/2014    Dg Chest 2 View  Result Date: 11/06/2014 CLINICAL DATA:  Cough, wheezing for hours EXAM: CHEST  2 VIEW COMPARISON:  06/02/2008 FINDINGS: The heart size and mediastinal contours are within normal limits. Both lungs are clear. The visualized skeletal structures are unremarkable. IMPRESSION: No active cardiopulmonary disease. Electronically Signed   By: Zachary KoHetal  Michael   On: 11/06/2014 15:11     Assessment & Plan:  Plan  I have changed Mr. Zachary Michael's pioglitazone, metFORMIN, glimepiride, fenofibrate, ezetimibe, and carvedilol. I am also having him maintain his aspirin, nitroGLYCERIN, albuterol, simvastatin, lisinopril, and carvedilol.  Meds ordered this encounter  Medications  . simvastatin (ZOCOR) 40 MG tablet    Sig: Take 1 tablet (40 mg total) by mouth at bedtime.    Dispense:  90 tablet    Refill:  1  . pioglitazone (ACTOS) 45 MG tablet    Sig: Take 1 tablet (45 mg total) by mouth daily.    Dispense:  90 tablet    Refill:  1  . metFORMIN (GLUCOPHAGE) 1000 MG tablet    Sig: Take 1 tablet (1,000 mg total) by mouth 2 (two) times daily with a meal.    Dispense:  180 tablet    Refill:  1  . lisinopril (PRINIVIL,ZESTRIL) 10 MG tablet    Sig: TAKE 1 TABLET DAILY (CALL AND SCHEDULE  APPOINTMENT)    Dispense:  90 tablet    Refill:  0  . glimepiride (AMARYL) 2 MG tablet    Sig: Take 1 tablet (2 mg total) by mouth daily.    Dispense:  90 tablet    Refill:  1  . fenofibrate 160 MG tablet    Sig: Take 1 tablet (160 mg total) by mouth daily.    Dispense:  90 tablet    Refill:  1  . ezetimibe (ZETIA) 10 MG tablet    Sig: Take 1 tablet (10 mg total) by mouth daily.  . carvedilol (COREG) 6.25 MG tablet    Sig: TAKE 1 TABLET TWICE A DAY WITH MEALS    Dispense:  60 tablet  Refill:  0    Please keep upcoming appointment for future refills.  . carvedilol (COREG) 6.25 MG tablet    Sig: Take 1 tablet (6.25 mg total) by mouth 2 (two) times daily with a meal.    Dispense:  60 tablet    Refill:  1    Problem List Items Addressed This Visit      Unprioritized   Diabetes mellitus type II, uncontrolled (HCC) - Primary    Check labs con't meds      Relevant Medications   simvastatin (ZOCOR) 40 MG tablet   pioglitazone (ACTOS) 45 MG tablet   metFORMIN (GLUCOPHAGE) 1000 MG tablet   lisinopril (PRINIVIL,ZESTRIL) 10 MG tablet   glimepiride (AMARYL) 2 MG tablet   Other Relevant Orders   Hemoglobin A1c (Completed)   POCT urinalysis dipstick (Completed)   Comprehensive metabolic panel (Completed)   Hyperlipidemia    Check labs con't meds      Relevant Medications   simvastatin (ZOCOR) 40 MG tablet   lisinopril (PRINIVIL,ZESTRIL) 10 MG tablet   fenofibrate 160 MG tablet   ezetimibe (ZETIA) 10 MG tablet   carvedilol (COREG) 6.25 MG tablet   carvedilol (COREG) 6.25 MG tablet   Hypertension    Check labs con't meds      Relevant Medications   simvastatin (ZOCOR) 40 MG tablet   lisinopril (PRINIVIL,ZESTRIL) 10 MG tablet   fenofibrate 160 MG tablet   ezetimibe (ZETIA) 10 MG tablet   carvedilol (COREG) 6.25 MG tablet   carvedilol (COREG) 6.25 MG tablet   Other Relevant Orders   Comprehensive metabolic panel (Completed)    Other Visit Diagnoses     Hyperlipidemia LDL goal <70       Relevant Medications   simvastatin (ZOCOR) 40 MG tablet   lisinopril (PRINIVIL,ZESTRIL) 10 MG tablet   fenofibrate 160 MG tablet   ezetimibe (ZETIA) 10 MG tablet   carvedilol (COREG) 6.25 MG tablet   carvedilol (COREG) 6.25 MG tablet   Other Relevant Orders   Lipid panel (Completed)   Comprehensive metabolic panel (Completed)      Follow-up: Return in about 6 months (around 05/11/2016) for hypertension, hyperlipidemia, diabetes II.  Donato Schultz, DO

## 2015-11-12 NOTE — Patient Instructions (Signed)

## 2015-11-12 NOTE — Progress Notes (Signed)
Pre visit review using our clinic review tool, if applicable. No additional management support is needed unless otherwise documented below in the visit note. 

## 2015-12-16 ENCOUNTER — Other Ambulatory Visit: Payer: Self-pay | Admitting: Family Medicine

## 2015-12-16 ENCOUNTER — Other Ambulatory Visit: Payer: Self-pay | Admitting: Cardiovascular Disease

## 2015-12-16 DIAGNOSIS — I10 Essential (primary) hypertension: Secondary | ICD-10-CM

## 2015-12-16 DIAGNOSIS — E785 Hyperlipidemia, unspecified: Secondary | ICD-10-CM

## 2016-02-13 ENCOUNTER — Other Ambulatory Visit: Payer: Self-pay | Admitting: Family Medicine

## 2016-02-26 ENCOUNTER — Other Ambulatory Visit: Payer: Self-pay | Admitting: Family Medicine

## 2016-02-26 ENCOUNTER — Other Ambulatory Visit: Payer: Self-pay | Admitting: Cardiovascular Disease

## 2016-02-26 DIAGNOSIS — I10 Essential (primary) hypertension: Secondary | ICD-10-CM

## 2016-04-12 ENCOUNTER — Other Ambulatory Visit: Payer: Self-pay | Admitting: Family Medicine

## 2016-04-12 DIAGNOSIS — E785 Hyperlipidemia, unspecified: Secondary | ICD-10-CM

## 2016-04-12 DIAGNOSIS — I10 Essential (primary) hypertension: Secondary | ICD-10-CM

## 2016-04-12 MED ORDER — SIMVASTATIN 40 MG PO TABS: 40.0000 mg | ORAL_TABLET | Freq: Every day | ORAL | 0 refills | Status: DC

## 2016-04-12 MED ORDER — PIOGLITAZONE HCL 45 MG PO TABS: 45.0000 mg | ORAL_TABLET | Freq: Every day | ORAL | 0 refills | Status: DC

## 2016-04-12 MED ORDER — METFORMIN HCL 1000 MG PO TABS: 1000.0000 mg | ORAL_TABLET | Freq: Two times a day (BID) | ORAL | 0 refills | Status: DC

## 2016-04-12 MED ORDER — GLIMEPIRIDE 2 MG PO TABS: 2.0000 mg | ORAL_TABLET | Freq: Every day | ORAL | 0 refills | Status: DC

## 2016-04-12 MED ORDER — FENOFIBRATE 160 MG PO TABS: 160.0000 mg | ORAL_TABLET | Freq: Every day | ORAL | 0 refills | Status: DC

## 2016-04-12 MED ORDER — LISINOPRIL 10 MG PO TABS: 10.0000 mg | ORAL_TABLET | Freq: Every day | ORAL | 0 refills | Status: DC

## 2016-04-26 ENCOUNTER — Other Ambulatory Visit: Payer: Self-pay | Admitting: *Deleted

## 2016-04-26 MED ORDER — CARVEDILOL 6.25 MG PO TABS: 6.2500 mg | ORAL_TABLET | Freq: Two times a day (BID) | ORAL | 0 refills | Status: DC

## 2016-05-12 ENCOUNTER — Encounter: Payer: Self-pay | Admitting: Family Medicine

## 2016-05-12 ENCOUNTER — Ambulatory Visit (INDEPENDENT_AMBULATORY_CARE_PROVIDER_SITE_OTHER): Payer: Medicare Other | Admitting: Family Medicine

## 2016-05-12 VITALS — BP 94/58 | HR 62 | Temp 97.7°F | Resp 17 | Ht 69.0 in | Wt 303.4 lb

## 2016-05-12 DIAGNOSIS — R0683 Snoring: Secondary | ICD-10-CM

## 2016-05-12 DIAGNOSIS — I10 Essential (primary) hypertension: Secondary | ICD-10-CM

## 2016-05-12 DIAGNOSIS — E785 Hyperlipidemia, unspecified: Secondary | ICD-10-CM | POA: Diagnosis not present

## 2016-05-12 DIAGNOSIS — E118 Type 2 diabetes mellitus with unspecified complications: Secondary | ICD-10-CM | POA: Diagnosis not present

## 2016-05-12 DIAGNOSIS — E1165 Type 2 diabetes mellitus with hyperglycemia: Secondary | ICD-10-CM

## 2016-05-12 LAB — LIPID PANEL
Cholesterol: 138 mg/dL (ref 0–200)
HDL: 62.6 mg/dL (ref >39.00)
LDL Cholesterol: 61 mg/dL (ref 0–99)
NonHDL: 75.04
Total CHOL/HDL Ratio: 2
Triglycerides: 68 mg/dL (ref 0.0–149.0)
VLDL: 13.6 mg/dL (ref 0.0–40.0)

## 2016-05-12 LAB — COMPREHENSIVE METABOLIC PANEL
ALT: 18 U/L (ref 0–53)
AST: 20 U/L (ref 0–37)
Albumin: 4.2 g/dL (ref 3.5–5.2)
Alkaline Phosphatase: 23 U/L — ABNORMAL LOW (ref 39–117)
BUN: 22 mg/dL (ref 6–23)
CO2: 26 mEq/L (ref 19–32)
Calcium: 9.9 mg/dL (ref 8.4–10.5)
Chloride: 106 mEq/L (ref 96–112)
Creatinine, Ser: 1.04 mg/dL (ref 0.40–1.50)
GFR: 75.84 mL/min (ref >60.00)
Glucose, Bld: 105 mg/dL — ABNORMAL HIGH (ref 70–99)
Potassium: 4.3 mEq/L (ref 3.5–5.1)
Sodium: 140 mEq/L (ref 135–145)
Total Bilirubin: 0.3 mg/dL (ref 0.2–1.2)
Total Protein: 6.9 g/dL (ref 6.0–8.3)

## 2016-05-12 LAB — CBC
HCT: 37.4 % — ABNORMAL LOW (ref 39.0–52.0)
Hemoglobin: 12.4 g/dL — ABNORMAL LOW (ref 13.0–17.0)
MCHC: 33.1 g/dL (ref 30.0–36.0)
MCV: 89.1 fl (ref 78.0–100.0)
Platelets: 255 10*3/uL (ref 150.0–400.0)
RBC: 4.2 Mil/uL — ABNORMAL LOW (ref 4.22–5.81)
RDW: 14 % (ref 11.5–15.5)
WBC: 6.7 10*3/uL (ref 4.0–10.5)

## 2016-05-12 LAB — HEMOGLOBIN A1C: Hgb A1c MFr Bld: 7 % — ABNORMAL HIGH (ref 4.6–6.5)

## 2016-05-12 MED ORDER — SIMVASTATIN 40 MG PO TABS: 40.0000 mg | ORAL_TABLET | Freq: Every day | ORAL | 0 refills | Status: DC

## 2016-05-12 MED ORDER — CARVEDILOL 6.25 MG PO TABS: 6.2500 mg | ORAL_TABLET | Freq: Two times a day (BID) | ORAL | 0 refills | Status: DC

## 2016-05-12 MED ORDER — PIOGLITAZONE HCL 45 MG PO TABS: 45.0000 mg | ORAL_TABLET | Freq: Every day | ORAL | 0 refills | Status: DC

## 2016-05-12 MED ORDER — LISINOPRIL 10 MG PO TABS: 10.0000 mg | ORAL_TABLET | Freq: Every day | ORAL | 0 refills | Status: DC

## 2016-05-12 MED ORDER — GLIMEPIRIDE 2 MG PO TABS: 2.0000 mg | ORAL_TABLET | Freq: Every day | ORAL | 0 refills | Status: DC

## 2016-05-12 MED ORDER — FENOFIBRATE 160 MG PO TABS: 160.0000 mg | ORAL_TABLET | Freq: Every day | ORAL | 0 refills | Status: DC

## 2016-05-12 MED ORDER — METFORMIN HCL 1000 MG PO TABS: 1000.0000 mg | ORAL_TABLET | Freq: Two times a day (BID) | ORAL | 0 refills | Status: DC

## 2016-05-12 MED ORDER — BECLOMETHASONE DIPROPIONATE 40 MCG/ACT IN AERS: 2.0000 | INHALATION_SPRAY | Freq: Two times a day (BID) | RESPIRATORY_TRACT | 12 refills | Status: DC

## 2016-05-12 MED ORDER — ZOSTER VAC RECOMB ADJUVANTED 50 MCG/0.5ML IM SUSR: 50.0000 ug | Freq: Once | INTRAMUSCULAR | 1 refills | Status: AC

## 2016-05-12 NOTE — Assessment & Plan Note (Signed)
hgba1c to be checked, minimize simple carbs. Increase exercise as tolerated. Continue current meds  

## 2016-05-12 NOTE — Progress Notes (Signed)
Pre visit review using our clinic review tool, if applicable. No additional management support is needed unless otherwise documented below in the visit note. 

## 2016-05-12 NOTE — Patient Instructions (Signed)
Diabetes Mellitus and Food It is important for you to manage your blood sugar (glucose) level. Your blood glucose level can be greatly affected by what you eat. Eating healthier foods in the appropriate amounts throughout the day at about the same time each day will help you control your blood glucose level. It can also help slow or prevent worsening of your diabetes mellitus. Healthy eating may even help you improve the level of your blood pressure and reach or maintain a healthy weight. General recommendations for healthful eating and cooking habits include:  Eating meals and snacks regularly. Avoid going long periods of time without eating to lose weight.  Eating a diet that consists mainly of plant-based foods, such as fruits, vegetables, nuts, legumes, and whole grains.  Using low-heat cooking methods, such as baking, instead of high-heat cooking methods, such as deep frying.  Work with your dietitian to make sure you understand how to use the Nutrition Facts information on food labels. How can food affect me? Carbohydrates Carbohydrates affect your blood glucose level more than any other type of food. Your dietitian will help you determine how many carbohydrates to eat at each meal and teach you how to count carbohydrates. Counting carbohydrates is important to keep your blood glucose at a healthy level, especially if you are using insulin or taking certain medicines for diabetes mellitus. Alcohol Alcohol can cause sudden decreases in blood glucose (hypoglycemia), especially if you use insulin or take certain medicines for diabetes mellitus. Hypoglycemia can be a life-threatening condition. Symptoms of hypoglycemia (sleepiness, dizziness, and disorientation) are similar to symptoms of having too much alcohol. If your health care provider has given you approval to drink alcohol, do so in moderation and use the following guidelines:  Women should not have more than one drink per day, and men  should not have more than two drinks per day. One drink is equal to: ? 12 oz of beer. ? 5 oz of wine. ? 1 oz of hard liquor.  Do not drink on an empty stomach.  Keep yourself hydrated. Have water, diet soda, or unsweetened iced tea.  Regular soda, juice, and other mixers might contain a lot of carbohydrates and should be counted.  What foods are not recommended? As you make food choices, it is important to remember that all foods are not the same. Some foods have fewer nutrients per serving than other foods, even though they might have the same number of calories or carbohydrates. It is difficult to get your body what it needs when you eat foods with fewer nutrients. Examples of foods that you should avoid that are high in calories and carbohydrates but low in nutrients include:  Trans fats (most processed foods list trans fats on the Nutrition Facts label).  Regular soda.  Juice.  Candy.  Sweets, such as cake, pie, doughnuts, and cookies.  Fried foods.  What foods can I eat? Eat nutrient-rich foods, which will nourish your body and keep you healthy. The food you should eat also will depend on several factors, including:  The calories you need.  The medicines you take.  Your weight.  Your blood glucose level.  Your blood pressure level.  Your cholesterol level.  You should eat a variety of foods, including:  Protein. ? Lean cuts of meat. ? Proteins low in saturated fats, such as fish, egg whites, and beans. Avoid processed meats.  Fruits and vegetables. ? Fruits and vegetables that may help control blood glucose levels, such as apples,   mangoes, and yams.  Dairy products. ? Choose fat-free or low-fat dairy products, such as milk, yogurt, and cheese.  Grains, bread, pasta, and rice. ? Choose whole grain products, such as multigrain bread, whole oats, and brown rice. These foods may help control blood pressure.  Fats. ? Foods containing healthful fats, such as  nuts, avocado, olive oil, canola oil, and fish.  Does everyone with diabetes mellitus have the same meal plan? Because every person with diabetes mellitus is different, there is not one meal plan that works for everyone. It is very important that you meet with a dietitian who will help you create a meal plan that is just right for you. This information is not intended to replace advice given to you by your health care provider. Make sure you discuss any questions you have with your health care provider. Document Released: 11/11/2004 Document Revised: 07/23/2015 Document Reviewed: 01/11/2013 Elsevier Interactive Patient Education  2017 Else[employer] Inc[employer]  

## 2016-05-12 NOTE — Assessment & Plan Note (Signed)
Well controlled, no changes to meds. Encouraged heart healthy diet such as the DASH diet and exercise as tolerated.  °

## 2016-05-12 NOTE — Assessment & Plan Note (Signed)
Tolerating statin, encouraged heart healthy diet, avoid trans fats, minimize simple carbs and saturated fats. Increase exercise as tolerated 

## 2016-05-12 NOTE — Progress Notes (Signed)
Subjective:  I acted as a Neurosurgeon for Dr. Delman Kitten, LPN    Patient ID: Zachary Michael, male    DOB : [birth year], 66 y.o.   MRN: [id number]  Chief Complaint  Patient presents with  . Hypertension    follow up  . Diabetes    follow up    Hypertension  This is a chronic problem. The current episode started more than 1 year ago. The problem is controlled. Pertinent negatives include no blurred vision, chest pain, headaches or palpitations.  Diabetes  He presents for his follow-up diabetic visit. He has type 2 diabetes mellitus. His disease course has been stable. Pertinent negatives for hypoglycemia include no headaches. Pertinent negatives for diabetes include no blurred vision and no chest pain.    Patient is in today for follow up hypertension, diabetes. Patient report last month he notice when he woke up one morning he couldn't breathe. Patient report he could exhale but had trouble inhaling for about a couple second. Patient state he had to force himself to inhale. Patient report he have to use his inhaler more than usual.  Patient Care Team: Donato Schultz, DO as PCP - General Norva Pavlov, OD as Consulting Physician (Optometry)   Past Medical History:  Diagnosis Date  . Asthma   . CAD (coronary artery disease)    Mild  . Diabetes mellitus   . HTN (hypertension)   . Hyperlipidemia     Past Surgical History:  Procedure Laterality Date  . APPENDECTOMY    . CARDIAC CATHETERIZATION     We recently performed a which revealed minor coronary artery irregularities. He had normal left ventricle systolic function with an EF of 60%  . CARDIOVASCULAR STRESS TEST     EF 63%  . TONSILLECTOMY    . TUMOR REMOVAL     LEFT ARM    Family History  Problem Relation Age of Onset  . Pneumonia Mother   . Osteoporosis Mother   . Arthritis Mother   . Diabetes Mother   . Hypertension Mother   . Cancer Father     leukemia, lung  . Cancer Brother   . Diabetes Maternal  Aunt   . Hypertension Maternal Aunt   . Hypertension Maternal Aunt   . Lung cancer      Social History   Social History  . Marital status: Married    Spouse name: N/A  . Number of children: 3  . Years of education: N/A   Occupational History  . [employer]    Social History Main Topics  . Smoking status: Never Smoker  . Smokeless tobacco: Never Used  . Alcohol use No  . Drug use: No  . Sexual activity: Yes    Partners: Female   Other Topics Concern  . Not on file   Social History Narrative   Exercise-- walking at work    Outpatient Medications Prior to Visit  Medication Sig Dispense Refill  . albuterol (PROAIR HFA) 108 (90 BASE) MCG/ACT inhaler Inhale 2 puffs into the lungs every 6 (six) hours as needed for wheezing or shortness of breath. 1 Inhaler 4  . aspirin 81 MG tablet Take 81 mg by mouth daily.      [name] ezetimibe (ZETIA) 10 MG tablet Take 1 tablet (10 mg total) by mouth daily.    . nitroGLYCERIN (NITROSTAT) 0.4 MG SL tablet Place 1 tablet (0.4 mg total) under the tongue every 5 (five) minutes as needed for chest  pain. 25 tablet 5  . carvedilol (COREG) 6.25 MG tablet Take 1 tablet (6.25 mg total) by mouth 2 (two) times daily with a meal. 180 tablet 0  . fenofibrate 160 MG tablet Take 1 tablet (160 mg total) by mouth daily. 90 tablet 0  . glimepiride (AMARYL) 2 MG tablet Take 1 tablet (2 mg total) by mouth daily. 90 tablet 0  . lisinopril (PRINIVIL,ZESTRIL) 10 MG tablet Take 1 tablet (10 mg total) by mouth daily. 90 tablet 0  . metFORMIN (GLUCOPHAGE) 1000 MG tablet Take 1 tablet (1,000 mg total) by mouth 2 (two) times daily with a meal. 180 tablet 0  . pioglitazone (ACTOS) 45 MG tablet Take 1 tablet (45 mg total) by mouth daily. 90 tablet 0  . simvastatin (ZOCOR) 40 MG tablet Take 1 tablet (40 mg total) by mouth at bedtime. 90 tablet 0   No facility-administered medications prior to visit.     No Known Allergies  Review of Systems  Constitutional: Negative  for fever.  HENT: Negative for congestion.   Eyes: Negative for blurred vision.  Respiratory: Negative for cough.   Cardiovascular: Negative for chest pain and palpitations.  Gastrointestinal: Negative for vomiting.  Musculoskeletal: Negative for back pain.  Skin: Negative for rash.  Neurological: Negative for loss of consciousness and headaches.       Objective:    Physical Exam  Constitutional: He is oriented to person, place, and time. He appears well-developed and well-nourished. No distress.  HENT:  Head: Normocephalic and atraumatic.  Eyes: Conjunctivae are normal.  Neck: Normal range of motion. No thyromegaly present.  Cardiovascular: Normal rate and regular rhythm.   Pulmonary/Chest: Effort normal and breath sounds normal. He has no wheezes.  Abdominal: Soft. Bowel sounds are normal. There is no tenderness.  Musculoskeletal: Normal range of motion. He exhibits no edema or deformity.  Neurological: He is alert and oriented to person, place, and time.  Skin: Skin is warm and dry. He is not diaphoretic.  Psychiatric: He has a normal mood and affect.    BP (!) 94/58 (BP Location: Right Arm, Patient Position: Sitting, Cuff Size: Large)   Pulse 62   Temp 97.7 F (36.5 C) (Oral)   Resp 17   Ht 5\' 9"  (1.753 m)   Wt (!) 303 lb 6.4 oz (137.6 kg)   SpO2 97%   BMI 44.80 kg/m  Wt Readings from Last 3 Encounters:  05/12/16 (!) 303 lb 6.4 oz (137.6 kg)  11/12/15 (!) 302 lb 9.6 oz (137.3 kg)  05/11/15 (!) 307 lb 6.4 oz (139.4 kg)      Immunization History  Administered Date(s) Administered  . Pneumococcal Conjugate-13 05/11/2015  . Pneumococcal Polysaccharide-23 01/07/2013  . Tdap 09/23/2010  . Zoster 09/23/2010    Health Maintenance  Topic Date Due  . Hepatitis C Screening  1951-01-19  . OPHTHALMOLOGY EXAM  08/20/2015  . COLONOSCOPY  09/23/2015  . INFLUENZA VACCINE  09/29/2015  . FOOT EXAM  05/10/2016  . HEMOGLOBIN A1C  05/11/2016  . PNA vac Low Risk Adult (2 of  2 - PPSV23) 01/07/2018  . TETANUS/TDAP  09/22/2020    Lab Results  Component Value Date   WBC 6.0 05/11/2015   HGB 12.3 (L) 05/11/2015   HCT 36.3 (L) 05/11/2015   PLT 250.0 05/11/2015   GLUCOSE 90 11/12/2015   CHOL 124 11/12/2015   TRIG 68.0 11/12/2015   HDL 60.20 11/12/2015   LDLDIRECT 198.6 03/20/2009   LDLCALC 51 11/12/2015   ALT 15  11/12/2015   AST 17 11/12/2015   NA 138 11/12/2015   K 4.9 11/12/2015   CL 104 11/12/2015   CREATININE 1.01 11/12/2015   BUN 18 11/12/2015   CO2 30 11/12/2015   TSH 0.98 09/27/2011   PSA 2.46 05/11/2015   INR 1.0 06/02/2008   HGBA1C 6.4 11/12/2015   MICROALBUR 1.2 11/06/2014    Lab Results  Component Value Date   TSH 0.98 09/27/2011   Lab Results  Component Value Date   WBC 6.0 05/11/2015   HGB 12.3 (L) 05/11/2015   HCT 36.3 (L) 05/11/2015   MCV 87.7 05/11/2015   PLT 250.0 05/11/2015   Lab Results  Component Value Date   NA 138 11/12/2015   K 4.9 11/12/2015   CO2 30 11/12/2015   GLUCOSE 90 11/12/2015   BUN 18 11/12/2015   CREATININE 1.01 11/12/2015   BILITOT 0.4 11/12/2015   ALKPHOS 25 (L) 11/12/2015   AST 17 11/12/2015   ALT 15 11/12/2015   PROT 6.8 11/12/2015   ALBUMIN 4.1 11/12/2015   CALCIUM 9.4 11/12/2015   GFR 78.56 11/12/2015   Lab Results  Component Value Date   CHOL 124 11/12/2015   Lab Results  Component Value Date   HDL 60.20 11/12/2015   Lab Results  Component Value Date   LDLCALC 51 11/12/2015   Lab Results  Component Value Date   TRIG 68.0 11/12/2015   Lab Results  Component Value Date   CHOLHDL 2 11/12/2015   Lab Results  Component Value Date   HGBA1C 6.4 11/12/2015         Assessment & Plan:   Problem List Items Addressed This Visit      Unprioritized   Diabetes mellitus type II, uncontrolled (HCC)    hgba1c to be checked, minimize simple carbs. Increase exercise as tolerated. Continue current meds      Relevant Medications   glimepiride (AMARYL) 2 MG tablet   lisinopril  (PRINIVIL,ZESTRIL) 10 MG tablet   metFORMIN (GLUCOPHAGE) 1000 MG tablet   pioglitazone (ACTOS) 45 MG tablet   simvastatin (ZOCOR) 40 MG tablet   Other Relevant Orders   Hemoglobin A1c   Hyperlipidemia    Tolerating statin, encouraged heart healthy diet, avoid trans fats, minimize simple carbs and saturated fats. Increase exercise as tolerated      Relevant Medications   carvedilol (COREG) 6.25 MG tablet   fenofibrate 160 MG tablet   lisinopril (PRINIVIL,ZESTRIL) 10 MG tablet   simvastatin (ZOCOR) 40 MG tablet   Other Relevant Orders   Lipid panel   Hypertension - Primary    Well controlled, no changes to meds. Encouraged heart healthy diet such as the DASH diet and exercise as tolerated.       Relevant Medications   carvedilol (COREG) 6.25 MG tablet   fenofibrate 160 MG tablet   lisinopril (PRINIVIL,ZESTRIL) 10 MG tablet   simvastatin (ZOCOR) 40 MG tablet   Other Relevant Orders   CBC   Comprehensive metabolic panel    Other Visit Diagnoses    Snoring       Relevant Orders   Ambulatory referral to Pulmonology   Hyperlipidemia LDL goal <70       Relevant Medications   carvedilol (COREG) 6.25 MG tablet   fenofibrate 160 MG tablet   lisinopril (PRINIVIL,ZESTRIL) 10 MG tablet   simvastatin (ZOCOR) 40 MG tablet      I am having Mr. Antony MaduraDaly start on beclomethasone and Zoster Vac Recomb Adjuvanted. I am also having him  maintain his aspirin, nitroGLYCERIN, albuterol, ezetimibe, carvedilol, fenofibrate, glimepiride, lisinopril, metFORMIN, pioglitazone, and simvastatin.  Meds ordered this encounter  Medications  . beclomethasone (QVAR) 40 MCG/ACT inhaler    Sig: Inhale 2 puffs into the lungs 2 (two) times daily.    Dispense:  1 Inhaler    Refill:  12  . Zoster Vac Recomb Adjuvanted (SHINGRIX) 50 MCG SUSR    Sig: Inject 50 mcg into the muscle once.    Dispense:  1 each    Refill:  1  . carvedilol (COREG) 6.25 MG tablet    Sig: Take 1 tablet (6.25 mg total) by mouth 2 (two)  times daily with a meal.    Dispense:  180 tablet    Refill:  0  . fenofibrate 160 MG tablet    Sig: Take 1 tablet (160 mg total) by mouth daily.    Dispense:  90 tablet    Refill:  0  . glimepiride (AMARYL) 2 MG tablet    Sig: Take 1 tablet (2 mg total) by mouth daily.    Dispense:  90 tablet    Refill:  0  . lisinopril (PRINIVIL,ZESTRIL) 10 MG tablet    Sig: Take 1 tablet (10 mg total) by mouth daily.    Dispense:  90 tablet    Refill:  0  . metFORMIN (GLUCOPHAGE) 1000 MG tablet    Sig: Take 1 tablet (1,000 mg total) by mouth 2 (two) times daily with a meal.    Dispense:  180 tablet    Refill:  0  . pioglitazone (ACTOS) 45 MG tablet    Sig: Take 1 tablet (45 mg total) by mouth daily.    Dispense:  90 tablet    Refill:  0  . simvastatin (ZOCOR) 40 MG tablet    Sig: Take 1 tablet (40 mg total) by mouth at bedtime.    Dispense:  90 tablet    Refill:  0    CMA served as scribe during this visit. History, Physical and Plan performed by medical provider. Documentation and orders reviewed and attested to.  Donato Schultz, DO   Patient ID: Zachary Michael, male   DOB: June 03, 1950, 66 y.o.   MRN: [id number]

## 2016-05-13 ENCOUNTER — Ambulatory Visit (INDEPENDENT_AMBULATORY_CARE_PROVIDER_SITE_OTHER): Payer: Medicare Other | Admitting: Cardiovascular Disease

## 2016-05-13 ENCOUNTER — Encounter: Payer: Self-pay | Admitting: Cardiovascular Disease

## 2016-05-13 VITALS — BP 140/80 | HR 63 | Ht 69.0 in | Wt 306.1 lb

## 2016-05-13 DIAGNOSIS — E782 Mixed hyperlipidemia: Secondary | ICD-10-CM | POA: Diagnosis not present

## 2016-05-13 DIAGNOSIS — I10 Essential (primary) hypertension: Secondary | ICD-10-CM | POA: Diagnosis not present

## 2016-05-13 NOTE — Progress Notes (Signed)
Zachary Michael Date of Birth  12/26/1950 [facility] [address]    [address] [place], [place]  [id number] [phone]  Fax  [phone]   Problem List 1. Hypertension -  2. Hyperlipidemia - 3. CAD - 4. Diabetes Mellitus  History of Present Illness:  Zachary Michael is a 66 year old gentleman with a history of hypertension, hyperlipidemia, mild coronary artery disease, and diabetes mellitus.  He's done well since I last saw him. He's not had any episodes of chest pain or shortness of breath.  June 03, 2013:   Zachary Michael has retired since I last saw him.  He is doing well.  Occasional palpitations.  No syncope, no pre-syncope,  Sounds clinically like  a PVC.  Still eating salty foods.  Not exercising much, doing chores around the house - no regular exercise.   Not eating out as much.   Nov. 21, 2016  BP looks great.   Walking 1 1/2 miles a day .   No CP , no significant palpitations  Leg edema persists,  Still eats some salt.   May 13, 2016:   Doing well. Stressed at home  - 2 teenage daughters learning to drive  No CP , has some shortness of breath   Has some asthma issues.   Sensitive to cold air.  Walking on occasion  - does not exacerbate the CP .   Current Outpatient Prescriptions on File Prior to Visit  Medication Sig Dispense Refill  . albuterol (PROAIR HFA) 108 (90 BASE) MCG/ACT inhaler Inhale 2 puffs into the lungs every 6 (six) hours as needed for wheezing or shortness of breath. 1 Inhaler 4  . aspirin 81 MG tablet Take 81 mg by mouth daily.      . beclomethasone (QVAR) 40 MCG/ACT inhaler Inhale 2 puffs into the lungs 2 (two) times daily. 1 Inhaler 12  . carvedilol (COREG) 6.25 MG tablet Take 1 tablet (6.25 mg total) by mouth 2 (two) times daily with a meal. 180 tablet 0  . ezetimibe (ZETIA) 10 MG tablet Take 1 tablet (10 mg total) by mouth daily.    . fenofibrate 160 MG tablet Take 1 tablet (160 mg total) by mouth daily. 90 tablet 0  . glimepiride (AMARYL) 2 MG  tablet Take 1 tablet (2 mg total) by mouth daily. 90 tablet 0  . lisinopril (PRINIVIL,ZESTRIL) 10 MG tablet Take 1 tablet (10 mg total) by mouth daily. 90 tablet 0  . metFORMIN (GLUCOPHAGE) 1000 MG tablet Take 1 tablet (1,000 mg total) by mouth 2 (two) times daily with a meal. 180 tablet 0  . nitroGLYCERIN (NITROSTAT) 0.4 MG SL tablet Place 1 tablet (0.4 mg total) under the tongue every 5 (five) minutes as needed for chest pain. 25 tablet 5  . pioglitazone (ACTOS) 45 MG tablet Take 1 tablet (45 mg total) by mouth daily. 90 tablet 0  . simvastatin (ZOCOR) 40 MG tablet Take 1 tablet (40 mg total) by mouth at bedtime. 90 tablet 0   No current facility-administered medications on file prior to visit.     No Known Allergies  Past Medical History:  Diagnosis Date  . Asthma   . CAD (coronary artery disease)    Mild  . Diabetes mellitus   . HTN (hypertension)   . Hyperlipidemia     Past Surgical History:  Procedure Laterality Date  . APPENDECTOMY    . CARDIAC CATHETERIZATION     We recently performed a which revealed minor coronary artery irregularities. He had  normal left ventricle systolic function with an EF of 60%  . CARDIOVASCULAR STRESS TEST     EF 63%  . TONSILLECTOMY    . TUMOR REMOVAL     LEFT ARM    History  Smoking Status  . Never Smoker  Smokeless Tobacco  . Never Used    History  Alcohol Use No    Family History  Problem Relation Age of Onset  . Pneumonia Mother   . Osteoporosis Mother   . Arthritis Mother   . Diabetes Mother   . Hypertension Mother   . Cancer Father     leukemia, lung  . Cancer Brother   . Diabetes Maternal Aunt   . Hypertension Maternal Aunt   . Hypertension Maternal Aunt   . Lung cancer      Reviw of Systems:  Reviewed in the HPI.  All other systems are negative.  Physical Exam: BP 140/80 (BP Location: Left Arm, Patient Position: Sitting, Cuff Size: Large)   Pulse 63   Ht 5\' 9"  (1.753 m)   Wt (!) 306 lb 1.9 oz (138.9 kg)    SpO2 94%   BMI 45.21 kg/m  The patient is alert and oriented x 3.  The mood and affect are normal.   Skin: warm and dry.  Color is normal.   HEENT:   [name]/AT, no JVD, normal carotids Lungs: clear   Heart: RR, no murmurs   Abdomen: soft, + BS, non tender, mildly obese Extremities:   1-2 + edema, chronic stasis changes.  Neuro:  Non focal, CN intact, gait is normal   ECG: May 13, 2016:  NSR at 37.   Inc. RBBB. LAHB   Assessment / Plan:   1. Hypertension - Bp is well controlled.   2. Hyperlipidemia - followed by primary   3. CAD - minor irregularities in his right coronary artery. Otherwise his coronary arteries were normal. He had a stress test during which he had nonsustained ventricular tachycardia.  4. Diabetes Mellitus    Kristeen Miss, MD  05/13/2016 9:42 AM    [facility] [address] [place],  [address] [place], [place]  [address] Pager [phone] Phone: [phone]; Fax: [phone]

## 2016-05-13 NOTE — Patient Instructions (Signed)
Medication Instructions:  Your physician recommends that you continue on your current medications as directed. Please refer to the Current Medication list given to you today.   Labwork: None Ordered   Testing/Procedures: None Ordered   Follow-Up: Your physician wants you to follow-up in: 1 year with Dr. Nahser.  You will receive a reminder letter in the mail two months in advance. If you don't receive a letter, please call our office to schedule the follow-up appointment.   If you need a refill on your cardiac medications before your next appointment, please call your pharmacy.   Thank you for choosing [facility]! Clotine Heiner, RN [phone]    

## 2016-05-16 ENCOUNTER — Other Ambulatory Visit: Payer: Self-pay | Admitting: Family Medicine

## 2016-05-16 MED ORDER — BECLOMETHASONE DIPROPIONATE 40 MCG/ACT IN AERS: 2.0000 | INHALATION_SPRAY | Freq: Two times a day (BID) | RESPIRATORY_TRACT | 2 refills | Status: DC

## 2016-05-17 ENCOUNTER — Other Ambulatory Visit: Payer: Self-pay | Admitting: Family Medicine

## 2016-05-17 DIAGNOSIS — E119 Type 2 diabetes mellitus without complications: Secondary | ICD-10-CM

## 2016-07-15 ENCOUNTER — Encounter: Payer: Self-pay | Admitting: Pulmonary Disease

## 2016-07-15 ENCOUNTER — Ambulatory Visit (INDEPENDENT_AMBULATORY_CARE_PROVIDER_SITE_OTHER): Payer: Medicare Other | Admitting: Pulmonary Disease

## 2016-07-15 VITALS — BP 114/70 | HR 79 | Ht 69.5 in | Wt 312.2 lb

## 2016-07-15 DIAGNOSIS — Z6841 Body Mass Index (BMI) 40.0 and over, adult: Secondary | ICD-10-CM | POA: Diagnosis not present

## 2016-07-15 DIAGNOSIS — R0683 Snoring: Secondary | ICD-10-CM

## 2016-07-15 DIAGNOSIS — R29818 Other symptoms and signs involving the nervous system: Secondary | ICD-10-CM

## 2016-07-15 NOTE — Patient Instructions (Signed)
Will arrange for home sleep study Will call to arrange for follow up after sleep study reviewed  

## 2016-07-15 NOTE — Progress Notes (Signed)
   Subjective:    Patient ID: Zachary Michael, male    DOB: 05/27/1950, 66 y.o.   MRN: [id number]  HPI    Review of Systems  Constitutional: Negative for fever and unexpected weight change.  HENT: Negative for congestion, dental problem, ear pain, nosebleeds, postnasal drip, rhinorrhea, sinus pressure, sneezing, sore throat and trouble swallowing.   Eyes: Negative for redness and itching.  Respiratory: Negative for cough, chest tightness, shortness of breath and wheezing.   Cardiovascular: Positive for chest pain. Negative for palpitations ( irregular heartbeats) and leg swelling.  Gastrointestinal: Negative for nausea and vomiting.  Genitourinary: Negative for dysuria.  Musculoskeletal: Negative for joint swelling.  Skin: Negative for rash.  Neurological: Negative for headaches.  Hematological: Does not bruise/bleed easily.  Psychiatric/Behavioral: Negative for dysphoric mood. The patient is not nervous/anxious.        Objective:   Physical Exam        Assessment & Plan:

## 2016-07-15 NOTE — Progress Notes (Signed)
Past Surgical History He  has a past surgical history that includes Appendectomy; Tonsillectomy; Tumor removal; Cardiac catheterization; and Cardiovascular stress test.  No Known Allergies  Family History His family history includes Arthritis in his mother; Cancer in his brother and father; Diabetes in his maternal aunt and mother; Hypertension in his maternal aunt, maternal aunt, and mother; Osteoporosis in his mother; Pneumonia in his mother.  Social History He  reports that he quit smoking about 38 years ago. His smoking use included Pipe. He quit after 7.00 years of use. He has never used smokeless tobacco. He reports that he drinks alcohol. He reports that he does not use drugs.  Review of systems Constitutional: Negative for fever and unexpected weight change.  HENT: Negative for congestion, dental problem, ear pain, nosebleeds, postnasal drip, rhinorrhea, sinus pressure, sneezing, sore throat and trouble swallowing.   Eyes: Negative for redness and itching.  Respiratory: Negative for cough, chest tightness, shortness of breath and wheezing.   Cardiovascular: Positive for chest pain. Negative for palpitations ( irregular heartbeats) and leg swelling.  Gastrointestinal: Negative for nausea and vomiting.  Genitourinary: Negative for dysuria.  Musculoskeletal: Negative for joint swelling.  Skin: Negative for rash.  Neurological: Negative for headaches.  Hematological: Does not bruise/bleed easily.  Psychiatric/Behavioral: Negative for dysphoric mood. The patient is not nervous/anxious.     Current Outpatient Prescriptions on File Prior to Visit  Medication Sig  . albuterol (PROAIR HFA) 108 (90 BASE) MCG/ACT inhaler Inhale 2 puffs into the lungs every 6 (six) hours as needed for wheezing or shortness of breath.  [name] aspirin 81 MG tablet Take 81 mg by mouth daily.    . beclomethasone (QVAR) 40 MCG/ACT inhaler Inhale 2 puffs into the lungs 2 (two) times daily.  . carvedilol (COREG) 6.25  MG tablet Take 1 tablet (6.25 mg total) by mouth 2 (two) times daily with a meal.  . ezetimibe (ZETIA) 10 MG tablet Take 1 tablet (10 mg total) by mouth daily.  . fenofibrate 160 MG tablet Take 1 tablet (160 mg total) by mouth daily.  [name] glimepiride (AMARYL) 2 MG tablet Take 1 tablet (2 mg total) by mouth daily.  [name] lisinopril (PRINIVIL,ZESTRIL) 10 MG tablet Take 1 tablet (10 mg total) by mouth daily.  . metFORMIN (GLUCOPHAGE) 1000 MG tablet Take 1 tablet (1,000 mg total) by mouth 2 (two) times daily with a meal.  . nitroGLYCERIN (NITROSTAT) 0.4 MG SL tablet Place 1 tablet (0.4 mg total) under the tongue every 5 (five) minutes as needed for chest pain.  . simvastatin (ZOCOR) 40 MG tablet Take 1 tablet (40 mg total) by mouth at bedtime.   No current facility-administered medications on file prior to visit.     Chief Complaint  Patient presents with  . SLEEP CONSULT    Referred by Dr Zola Button for snoring. Epworth Score: 5    Past medical history He  has a past medical history of Asthma; CAD (coronary artery disease); Diabetes mellitus; HTN (hypertension); and Hyperlipidemia.  Vital signs BP 114/70 (BP Location: Right Arm, Cuff Size: Normal)   Pulse 79   Ht 5' 9.5" (1.765 m)   Wt (!) 312 lb 3.2 oz (141.6 kg)   SpO2 96%   BMI 45.44 kg/m   History of Present Illness Zachary Michael is a 66 y.o. male for evaluation of sleep problems.  He has snored his whole life.  This has been getting worse.  He noticed episodes of apnea.  His wife has noticed this  also.  He is tired during the day, but he thought this was related to getting older and hectic work schedule.  He will nap during the day when he can.  He goes to sleep at 11 pm.  He falls asleep after 10 minutes.  He wakes up 2 or 3 times to use the bathroom.  He gets out of bed at 7 am.  He feels tired in the morning.  He denies morning headache.  He does not use anything to help him fall sleep or stay awake.  He denies sleep walking,  sleep talking, bruxism, or nightmares.  There is no history of restless legs.  He denies sleep hallucinations, sleep paralysis, or cataplexy.  The Epworth score is 5 out of 24.   Physical Exam:  General - No distress ENT - No sinus tenderness, no oral exudate, no LAN, no thyromegaly, TM clear, pupils equal/reactive, MP 4 Cardiac - s1s2 regular, no murmur, pulses symmetric Chest - No wheeze/rales/dullness, good air entry, normal respiratory excursion Back - No focal tenderness Abd - Soft, non-tender, no organomegaly, + bowel sounds Ext - No edema Neuro - Normal strength, cranial nerves intact Skin - No rashes Psych - Normal mood, and behavior  Discussion: He has snoring, sleep disruption, apnea, and daytime sleepiness.  His BMI is > 35.  He has hx of CAD, DM and HTN.  I am concern he could have sleep apnea.  We discussed how sleep apnea can affect various health problems, including risks for hypertension, cardiovascular disease, and diabetes.  We also discussed how sleep disruption can increase risks for accidents, such as while driving.  Weight loss as a means of improving sleep apnea was also reviewed.  Additional treatment options discussed were CPAP therapy, oral appliance, and surgical intervention.  Assessment/plan:  Snoring with concern for obstructive sleep apnea. - will arrange for home sleep study    Patient Instructions  Will arrange for home sleep study Will call to arrange for follow up after sleep study reviewed     Coralyn HellingVineet Brinn Westby, M.D. Pager [phone] 07/15/2016, 11:02 AM

## 2016-07-29 ENCOUNTER — Other Ambulatory Visit: Payer: Self-pay | Admitting: Family Medicine

## 2016-07-29 ENCOUNTER — Other Ambulatory Visit: Payer: Self-pay | Admitting: Cardiovascular Disease

## 2016-07-29 DIAGNOSIS — E785 Hyperlipidemia, unspecified: Secondary | ICD-10-CM

## 2016-08-01 ENCOUNTER — Telehealth: Payer: Self-pay | Admitting: Family Medicine

## 2016-08-01 NOTE — Telephone Encounter (Signed)
Called patient to schedule awv. Left msg for patient to call office to schedule appt.  °

## 2016-08-15 ENCOUNTER — Other Ambulatory Visit: Payer: Self-pay | Admitting: Family Medicine

## 2016-08-15 DIAGNOSIS — I10 Essential (primary) hypertension: Secondary | ICD-10-CM

## 2016-08-15 MED ORDER — LISINOPRIL 10 MG PO TABS: 10.0000 mg | ORAL_TABLET | Freq: Every day | ORAL | 0 refills | Status: DC

## 2016-08-15 MED ORDER — METFORMIN HCL 1000 MG PO TABS: 1000.0000 mg | ORAL_TABLET | Freq: Two times a day (BID) | ORAL | 0 refills | Status: DC

## 2016-08-17 ENCOUNTER — Other Ambulatory Visit (INDEPENDENT_AMBULATORY_CARE_PROVIDER_SITE_OTHER): Payer: Medicare Other

## 2016-08-17 DIAGNOSIS — E119 Type 2 diabetes mellitus without complications: Secondary | ICD-10-CM

## 2016-08-17 LAB — COMPREHENSIVE METABOLIC PANEL
ALT: 18 U/L (ref 0–53)
AST: 19 U/L (ref 0–37)
Albumin: 4.4 g/dL (ref 3.5–5.2)
Alkaline Phosphatase: 26 U/L — ABNORMAL LOW (ref 39–117)
BUN: 19 mg/dL (ref 6–23)
CO2: 26 mEq/L (ref 19–32)
Calcium: 10.1 mg/dL (ref 8.4–10.5)
Chloride: 103 mEq/L (ref 96–112)
Creatinine, Ser: 1.01 mg/dL (ref 0.40–1.50)
GFR: 78.38 mL/min (ref >60.00)
Glucose, Bld: 131 mg/dL — ABNORMAL HIGH (ref 70–99)
Potassium: 4.4 mEq/L (ref 3.5–5.1)
Sodium: 136 mEq/L (ref 135–145)
Total Bilirubin: 0.4 mg/dL (ref 0.2–1.2)
Total Protein: 7 g/dL (ref 6.0–8.3)

## 2016-08-17 LAB — LIPID PANEL
Cholesterol: 146 mg/dL (ref 0–200)
HDL: 60.2 mg/dL (ref >39.00)
LDL Cholesterol: 70 mg/dL (ref 0–99)
NonHDL: 85.33
Total CHOL/HDL Ratio: 2
Triglycerides: 79 mg/dL (ref 0.0–149.0)
VLDL: 15.8 mg/dL (ref 0.0–40.0)

## 2016-08-17 LAB — HEMOGLOBIN A1C: Hgb A1c MFr Bld: 7.2 % — ABNORMAL HIGH (ref 4.6–6.5)

## 2016-08-18 ENCOUNTER — Other Ambulatory Visit: Payer: Self-pay | Admitting: Family Medicine

## 2016-08-25 ENCOUNTER — Encounter: Payer: Self-pay | Admitting: Family Medicine

## 2016-08-25 ENCOUNTER — Other Ambulatory Visit: Payer: Self-pay | Admitting: Family Medicine

## 2016-08-25 DIAGNOSIS — I10 Essential (primary) hypertension: Secondary | ICD-10-CM

## 2016-08-25 MED ORDER — METFORMIN HCL 1000 MG PO TABS: 1000.0000 mg | ORAL_TABLET | Freq: Two times a day (BID) | ORAL | 0 refills | Status: DC

## 2016-08-25 MED ORDER — LISINOPRIL 10 MG PO TABS: 10.0000 mg | ORAL_TABLET | Freq: Every day | ORAL | 1 refills | Status: DC

## 2016-08-25 MED ORDER — METFORMIN HCL 1000 MG PO TABS: 1000.0000 mg | ORAL_TABLET | Freq: Two times a day (BID) | ORAL | 1 refills | Status: DC

## 2016-08-25 MED ORDER — LISINOPRIL 10 MG PO TABS: 10.0000 mg | ORAL_TABLET | Freq: Every day | ORAL | 0 refills | Status: DC

## 2016-08-25 NOTE — Telephone Encounter (Signed)
Please see response to pt email from today as well requesting the same thing.

## 2016-09-26 ENCOUNTER — Other Ambulatory Visit: Payer: Self-pay

## 2016-10-16 ENCOUNTER — Other Ambulatory Visit: Payer: Self-pay | Admitting: Family Medicine

## 2016-11-07 ENCOUNTER — Encounter: Payer: Self-pay | Admitting: Family Medicine

## 2016-11-07 ENCOUNTER — Ambulatory Visit (INDEPENDENT_AMBULATORY_CARE_PROVIDER_SITE_OTHER): Payer: Medicare Other | Admitting: Family Medicine

## 2016-11-07 VITALS — BP 112/58 | HR 69 | Temp 97.6°F | Ht 69.5 in | Wt 311.4 lb

## 2016-11-07 DIAGNOSIS — Z1211 Encounter for screening for malignant neoplasm of colon: Secondary | ICD-10-CM

## 2016-11-07 DIAGNOSIS — E118 Type 2 diabetes mellitus with unspecified complications: Secondary | ICD-10-CM

## 2016-11-07 DIAGNOSIS — I2510 Atherosclerotic heart disease of native coronary artery without angina pectoris: Secondary | ICD-10-CM | POA: Diagnosis not present

## 2016-11-07 DIAGNOSIS — Z125 Encounter for screening for malignant neoplasm of prostate: Secondary | ICD-10-CM | POA: Diagnosis not present

## 2016-11-07 DIAGNOSIS — IMO0002 Reserved for concepts with insufficient information to code with codable children: Secondary | ICD-10-CM

## 2016-11-07 DIAGNOSIS — I10 Essential (primary) hypertension: Secondary | ICD-10-CM

## 2016-11-07 DIAGNOSIS — E785 Hyperlipidemia, unspecified: Secondary | ICD-10-CM | POA: Diagnosis not present

## 2016-11-07 DIAGNOSIS — Z0000 Encounter for general adult medical examination without abnormal findings: Secondary | ICD-10-CM

## 2016-11-07 DIAGNOSIS — E1165 Type 2 diabetes mellitus with hyperglycemia: Secondary | ICD-10-CM | POA: Diagnosis not present

## 2016-11-07 DIAGNOSIS — E1151 Type 2 diabetes mellitus with diabetic peripheral angiopathy without gangrene: Secondary | ICD-10-CM

## 2016-11-07 LAB — COMPREHENSIVE METABOLIC PANEL
ALT: 14 U/L (ref 0–53)
AST: 17 U/L (ref 0–37)
Albumin: 4.1 g/dL (ref 3.5–5.2)
Alkaline Phosphatase: 24 U/L — ABNORMAL LOW (ref 39–117)
BUN: 21 mg/dL (ref 6–23)
CO2: 27 mEq/L (ref 19–32)
Calcium: 9.8 mg/dL (ref 8.4–10.5)
Chloride: 103 mEq/L (ref 96–112)
Creatinine, Ser: 1.05 mg/dL (ref 0.40–1.50)
GFR: 74.89 mL/min (ref >60.00)
Glucose, Bld: 133 mg/dL — ABNORMAL HIGH (ref 70–99)
Potassium: 4.6 mEq/L (ref 3.5–5.1)
Sodium: 139 mEq/L (ref 135–145)
Total Bilirubin: 0.4 mg/dL (ref 0.2–1.2)
Total Protein: 6.3 g/dL (ref 6.0–8.3)

## 2016-11-07 LAB — CBC WITH DIFFERENTIAL/PLATELET
Basophils Absolute: 0.1 10*3/uL (ref 0.0–0.1)
Basophils Relative: 0.9 % (ref 0.0–3.0)
Eosinophils Absolute: 0.3 10*3/uL (ref 0.0–0.7)
Eosinophils Relative: 5.5 % — ABNORMAL HIGH (ref 0.0–5.0)
HCT: 35.8 % — ABNORMAL LOW (ref 39.0–52.0)
Hemoglobin: 11.9 g/dL — ABNORMAL LOW (ref 13.0–17.0)
Lymphocytes Relative: 18.6 % (ref 12.0–46.0)
Lymphs Abs: 1.2 10*3/uL (ref 0.7–4.0)
MCHC: 33.2 g/dL (ref 30.0–36.0)
MCV: 89 fl (ref 78.0–100.0)
Monocytes Absolute: 0.6 10*3/uL (ref 0.1–1.0)
Monocytes Relative: 8.7 % (ref 3.0–12.0)
Neutro Abs: 4.2 10*3/uL (ref 1.4–7.7)
Neutrophils Relative %: 66.3 % (ref 43.0–77.0)
Platelets: 271 10*3/uL (ref 150.0–400.0)
RBC: 4.02 Mil/uL — ABNORMAL LOW (ref 4.22–5.81)
RDW: 14.5 % (ref 11.5–15.5)
WBC: 6.3 10*3/uL (ref 4.0–10.5)

## 2016-11-07 LAB — POC URINALSYSI DIPSTICK (AUTOMATED)
Bilirubin, UA: NEGATIVE
Blood, UA: NEGATIVE
Glucose, UA: NEGATIVE
Ketones, UA: NEGATIVE
Leukocytes, UA: NEGATIVE
Nitrite, UA: NEGATIVE
Protein, UA: NEGATIVE
Spec Grav, UA: >=1.03 — AB (ref 1.010–1.025)
Urobilinogen, UA: 0.2 E.U./dL
pH, UA: 6 (ref 5.0–8.0)

## 2016-11-07 LAB — LIPID PANEL
Cholesterol: 134 mg/dL (ref 0–200)
HDL: 57 mg/dL (ref >39.00)
LDL Cholesterol: 63 mg/dL (ref 0–99)
NonHDL: 76.72
Total CHOL/HDL Ratio: 2
Triglycerides: 70 mg/dL (ref 0.0–149.0)
VLDL: 14 mg/dL (ref 0.0–40.0)

## 2016-11-07 LAB — HEMOGLOBIN A1C: Hgb A1c MFr Bld: 6.7 % — ABNORMAL HIGH (ref 4.6–6.5)

## 2016-11-07 LAB — HEMOCCULT GUIAC POC 1CARD (OFFICE): Fecal Occult Blood, POC: NEGATIVE

## 2016-11-07 LAB — TSH: TSH: 1.28 u[IU]/mL (ref 0.35–4.50)

## 2016-11-07 LAB — PSA: PSA: 3.68 ng/mL (ref 0.10–4.00)

## 2016-11-07 NOTE — Addendum Note (Signed)
Addended by: Garfield CorneaMABRY, JASMINE L on: 11/07/2016 03:43 PM   Modules accepted: Orders

## 2016-11-07 NOTE — Progress Notes (Signed)
Subjective:   Zachary Michael is a 66 y.o. male who presents for Medicare Annual/Subsequent preventive examination.  HPI HYPERTENSION   Blood pressure range-not checking   Chest pain- no      Dyspnea- no Lightheadedness- no   Edema- same  Other side effects - no   Medication compliance: good Low salt diet- yes     DIABETES    Blood Sugar ranges-not checking   Polyuria- no New Visual problems- no  Hypoglycemic symptoms- no  Other side effects-no Medication compliance - good Last eye exam- due Foot exam- today   HYPERLIPIDEMIA  Medication compliance- good RUQ pain- no  Muscle aches- no Other side effects-no    Review of Systems:  . Review of Systems  Constitutional: Negative for activity change, appetite change and fatigue.  HENT: Negative for hearing loss, congestion, tinnitus and ear discharge.   Eyes: Negative for visual disturbance (see optho q1y -- vision corrected to 20/20 with glasses).  Respiratory: Negative for cough, chest tightness and shortness of breath.   Cardiovascular: Negative for chest pain, palpitations and leg swelling.  Gastrointestinal: Negative for abdominal pain, diarrhea, constipation and abdominal distention.  Genitourinary: Negative for urgency, frequency, decreased urine volume and difficulty urinating.  Musculoskeletal: Negative for back pain, arthralgias and gait problem.  Skin: Negative for color change, pallor and rash.  Neurological: Negative for dizziness, light-headedness, numbness and headaches.  Hematological: Negative for adenopathy. Does not bruise/bleed easily.  Psychiatric/Behavioral: Negative for suicidal ideas, confusion, sleep disturbance, self-injury, dysphoric mood, decreased concentration and agitation.  Pt is able to read and write and can do all ADLs No risk for falling No abuse/ violence in home         Objective:    Vitals: BP (!) 112/58 (BP Location: Right Arm, Patient Position: Sitting, Cuff Size: Large)    Pulse 69   Temp 97.6 F (36.4 C) (Oral)   Ht 5' 9.5" (1.765 m)   Wt (!) 311 lb 6.4 oz (141.3 kg)   SpO2 96%   BMI 45.33 kg/m   Body mass index is 45.33 kg/m. BP (!) 112/58 (BP Location: Right Arm, Patient Position: Sitting, Cuff Size: Large)   Pulse 69   Temp 97.6 F (36.4 C) (Oral)   Ht 5' 9.5" (1.765 m)   Wt (!) 311 lb 6.4 oz (141.3 kg)   SpO2 96%   BMI 45.33 kg/m  General appearance: alert, cooperative, appears stated age and no distress Head: Normocephalic, without obvious abnormality, atraumatic Eyes: negative findings: lids and lashes normal, conjunctivae and sclerae normal and pupils equal, round, reactive to light and accomodation Ears: normal TM's and external ear canals both ears Nose: Nares normal. Septum midline. Mucosa normal. No drainage or sinus tenderness. Throat: lips, mucosa, and tongue normal; teeth and gums normal Neck: no adenopathy, no carotid bruit, no JVD, supple, symmetrical, trachea midline and thyroid not enlarged, symmetric, no tenderness/mass/nodules Back: symmetric, no curvature. ROM normal. No CVA tenderness. Lungs: clear to auscultation bilaterally Chest wall: no tenderness Heart: regular rate and rhythm, S1, S2 normal, no murmur, click, rub or gallop Abdomen: soft, non-tender; bowel sounds normal; no masses,  no organomegaly Male genitalia: normal, penis: no lesions or discharge. testes: no masses or tenderness. no hernias Rectal: normal tone, normal prostate, no masses or tenderness and soft brown guaiac negative stool noted Extremities: edema +1 pitting edema Pulses: 2+ and symmetric Skin: Skin color, texture, turgor normal. No rashes or lesions Lymph nodes: Cervical, supraclavicular, and axillary nodes normal. Neurologic: Alert  and oriented X 3, normal strength and tone. Normal symmetric reflexes. Normal coordination and gait Tobacco History  Smoking Status  . Former Smoker  . Years: 7.00  . Types: Pipe  . Quit date: 1980  Smokeless  Tobacco  . Never Used     Counseling given: Not Answered   Past Medical History:  Diagnosis Date  . Asthma   . CAD (coronary artery disease)    Mild  . Diabetes mellitus   . HTN (hypertension)   . Hyperlipidemia    Past Surgical History:  Procedure Laterality Date  . APPENDECTOMY    . CARDIAC CATHETERIZATION     We recently performed a which revealed minor coronary artery irregularities. He had normal left ventricle systolic function with an EF of 60%  . CARDIOVASCULAR STRESS TEST     EF 63%  . TONSILLECTOMY    . TUMOR REMOVAL     LEFT ARM   Family History  Problem Relation Age of Onset  . Pneumonia Mother   . Osteoporosis Mother   . Arthritis Mother   . Diabetes Mother   . Hypertension Mother   . Cancer Father        leukemia, lung  . Cancer Brother   . Diabetes Maternal Aunt   . Hypertension Maternal Aunt   . Hypertension Maternal Aunt   . Lung cancer Unknown    History  Sexual Activity  . Sexual activity: Yes  . Partners: Female    Outpatient Encounter Prescriptions as of 11/07/2016  Medication Sig  . albuterol (PROAIR HFA) 108 (90 BASE) MCG/ACT inhaler Inhale 2 puffs into the lungs every 6 (six) hours as needed for wheezing or shortness of breath.  [name]. aspirin 81 MG tablet Take 81 mg by mouth daily.    . beclomethasone (QVAR) 40 MCG/ACT inhaler Inhale 2 puffs into the lungs 2 (two) times daily.  . carvedilol (COREG) 6.25 MG tablet TAKE 1 TABLET TWICE DAILY  WITH MEALS  . ezetimibe (ZETIA) 10 MG tablet Take 1 tablet (10 mg total) by mouth daily.  . fenofibrate 160 MG tablet Take 1 tablet (160 mg total) by mouth daily.  [name]. glimepiride (AMARYL) 2 MG tablet Take 1 tablet (2 mg total) by mouth daily.  [name]. lisinopril (PRINIVIL,ZESTRIL) 10 MG tablet Take 1 tablet (10 mg total) by mouth daily.  . metFORMIN (GLUCOPHAGE) 1000 MG tablet Take 1 tablet (1,000 mg total) by mouth 2 (two) times daily with a meal.  . nitroGLYCERIN (NITROSTAT) 0.4 MG SL tablet Place 1 tablet  (0.4 mg total) under the tongue every 5 (five) minutes as needed for chest pain.  . pioglitazone (ACTOS) 45 MG tablet TAKE 1 TABLET DAILY  . simvastatin (ZOCOR) 40 MG tablet TAKE 1 TABLET AT BEDTIME  . [DISCONTINUED] fenofibrate 160 MG tablet TAKE 1 TABLET DAILY  . [DISCONTINUED] glimepiride (AMARYL) 2 MG tablet TAKE 1 TABLET DAILY  . [DISCONTINUED] simvastatin (ZOCOR) 40 MG tablet Take 1 tablet (40 mg total) by mouth at bedtime.  [name]. glimepiride (AMARYL) 4 MG tablet Take 4 mg by mouth daily with breakfast.   No facility-administered encounter medications on file as of 11/07/2016.     Activities of Daily Living In your present state of health, do you have any difficulty performing the following activities: 11/07/2016  Hearing? N  Vision? N  Difficulty concentrating or making decisions? N  Walking or climbing stairs? N  Dressing or bathing? N  Doing errands, shopping? N  Some recent data might be hidden  Patient Care Team: Zola Button, Grayling Congress, DO as PCP - General Norva Pavlov, OD as Consulting Physician (Optometry) Nahser, Deloris Ping, MD as Consulting Physician (Cardiology)   Assessment:    cpe Exercise Activities and Dietary recommendations Current Exercise Habits: Home exercise routine, Type of exercise: walking, Time (Minutes): > 60, Frequency (Times/Week): 3, Weekly Exercise (Minutes/Week): 0, Intensity: Moderate, Exercise limited by: None identified  Goals    None     Fall Risk Fall Risk  11/07/2016 11/07/2016 05/11/2015  Falls in the past year? Yes Yes Yes  Number falls in past yr: Injury with Fall? Yes Yes Yes  Comment - - Back pain  Risk for fall due to : Other (Comment) - -  Risk for fall due to: Comment he was on a step stool washing truck and fell - -  Follow up Falls prevention discussed - -   Depression Screen PHQ 2/9 Scores 11/07/2016 05/12/2016 05/11/2015 10/30/2012  PHQ - 2 Score 0 0 0 0    Cognitive Function MMSE - Mini Mental State Exam 11/07/2016    Orientation to time 5  Orientation to Place 5  Registration 3  Attention/ Calculation 5  Recall 3  Language- name 2 objects 2  Language- repeat 1  Language- follow 3 step command 3  Language- read & follow direction 1  Write a sentence 1  Copy design 1  Total score 30        Immunization History  Administered Date(s) Administered  . Pneumococcal Conjugate-13 05/11/2015  . Pneumococcal Polysaccharide-23 01/07/2013  . Tdap 09/23/2010  . Zoster 09/23/2010   Screening Tests Health Maintenance  Topic Date Due  . Hepatitis C Screening  1950/11/18  . OPHTHALMOLOGY EXAM  08/20/2015  . COLONOSCOPY  09/23/2015  . FOOT EXAM  05/10/2016  . INFLUENZA VACCINE  05/28/2017 (Originally 09/28/2016)  . HEMOGLOBIN A1C  02/16/2017  . PNA vac Low Risk Adult (2 of 2 - PPSV23) 01/07/2018  . TETANUS/TDAP  09/22/2020      Plan:   see AVS  I have personally reviewed and noted the following in the patient's chart:   . Medical and social history . Use of alcohol, tobacco or illicit drugs  . Current medications and supplements . Functional ability and status . Nutritional status . Physical activity . Advanced directives . List of other physicians . Hospitalizations, surgeries, and ER visits in previous 12 months . Vitals . Screenings to include cognitive, depression, and falls . Referrals and appointments  In addition, I have reviewed and discussed with patient certain preventive protocols, quality metrics, and best practice recommendations. A written personalized care plan for preventive services as well as general preventive health recommendations were provided to patient.   1. DM (diabetes mellitus) type II uncontrolled, periph vascular disorder (HCC) Tolerating statin, encouraged heart healthy diet, avoid trans fats, minimize simple carbs and saturated fats. Increase exercise as tolerated - Comprehensive metabolic panel - Hemoglobin A1c  2. Essential hypertension Well controlled,  no changes to meds. Encouraged heart healthy diet such as the DASH diet and exercise as tolerated.   - Lipid panel - TSH - CBC with Differential/Platelet - Comprehensive metabolic panel - Hemoglobin A1c - POCT Urinalysis Dipstick (Automated)  3. Hyperlipidemia LDL goal <70 Tolerating statin, encouraged heart healthy diet, avoid trans fats, minimize simple carbs and saturated fats. Increase exercise as tolerated - Lipid panel - Comprehensive metabolic panel  4. Medicare annual wellness visit, subsequent See above - PSA   5. Hyperlipidemia,  unspecified hyperlipidemia type   6. Uncontrolled type 2 diabetes mellitus with complication, unspecified whether long term insulin use (HCC) Check labs con't meds [id number] to be checked, minimize simple carbs. Increase exercise as tolerated. Continue current meds   7. Coronary artery disease involving native heart without angina pectoris, unspecified vessel or lesion type Per cardiology  8. Encounter for Medicare annual wellness exam    Donato Schultz, DO  11/07/2016

## 2016-11-07 NOTE — Assessment & Plan Note (Signed)
Tolerating statin, encouraged heart healthy diet, avoid trans fats, minimize simple carbs and saturated fats. Increase exercise as tolerated 

## 2016-11-07 NOTE — Assessment & Plan Note (Signed)
F/u cardiology 04/2017

## 2016-11-07 NOTE — Patient Instructions (Signed)
Preventive Care 65 Years and Older, Male Preventive care refers to lifestyle choices and visits with your health care provider that can promote health and wellness. What does preventive care include?  A yearly physical exam. This is also called an annual well check.  Dental exams once or twice a year.  Routine eye exams. Ask your health care provider how often you should have your eyes checked.  Personal lifestyle choices, including: ? Daily care of your teeth and gums. ? Regular physical activity. ? Eating a healthy diet. ? Avoiding tobacco and drug use. ? Limiting alcohol use. ? Practicing safe sex. ? Taking low doses of aspirin every day. ? Taking vitamin and mineral supplements as recommended by your health care provider. What happens during an annual well check? The services and screenings done by your health care provider during your annual well check will depend on your age, overall health, lifestyle risk factors, and family history of disease. Counseling Your health care provider may ask you questions about your:  Alcohol use.  Tobacco use.  Drug use.  Emotional well-being.  Home and relationship well-being.  Sexual activity.  Eating habits.  History of falls.  Memory and ability to understand (cognition).  Work and work environment.  Screening You may have the following tests or measurements:  Height, weight, and BMI.  Blood pressure.  Lipid and cholesterol levels. These may be checked every 5 years, or more frequently if you are over 50 years old.  Skin check.  Lung cancer screening. You may have this screening every year starting at age 55 if you have a 30-pack-year history of smoking and currently smoke or have quit within the past 15 years.  Fecal occult blood test (FOBT) of the stool. You may have this test every year starting at age 50.  Flexible sigmoidoscopy or colonoscopy. You may have a sigmoidoscopy every 5 years or a colonoscopy every 10  years starting at age 50.  Prostate cancer screening. Recommendations will vary depending on your family history and other risks.  Hepatitis C blood test.  Hepatitis B blood test.  Sexually transmitted disease (STD) testing.  Diabetes screening. This is done by checking your blood sugar (glucose) after you have not eaten for a while (fasting). You may have this done every 1-3 years.  Abdominal aortic aneurysm (AAA) screening. You may need this if you are a current or former smoker.  Osteoporosis. You may be screened starting at age 70 if you are at high risk.  Talk with your health care provider about your test results, treatment options, and if necessary, the need for more tests. Vaccines Your health care provider may recommend certain vaccines, such as:  Influenza vaccine. This is recommended every year.  Tetanus, diphtheria, and acellular pertussis (Tdap, Td) vaccine. You may need a Td booster every 10 years.  Varicella vaccine. You may need this if you have not been vaccinated.  Zoster vaccine. You may need this after age 60.  Measles, mumps, and rubella (MMR) vaccine. You may need at least one dose of MMR if you were born in 1957 or later. You may also need a second dose.  Pneumococcal 13-valent conjugate (PCV13) vaccine. One dose is recommended after age 67.  Pneumococcal polysaccharide (PPSV23) vaccine. One dose is recommended after age 67.  Meningococcal vaccine. You may need this if you have certain conditions.  Hepatitis A vaccine. You may need this if you have certain conditions or if you travel or work in places where you   may be exposed to hepatitis A.  Hepatitis B vaccine. You may need this if you have certain conditions or if you travel or work in places where you may be exposed to hepatitis B.  Haemophilus influenzae type b (Hib) vaccine. You may need this if you have certain risk factors.  Talk to your health care provider about which screenings and vaccines  you need and how often you need them. This information is not intended to replace advice given to you by your health care provider. Make sure you discuss any questions you have with your health care provider. Document Released: 03/13/2015 Document Revised: 11/04/2015 Document Reviewed: 12/16/2014 Elsevier Interactive Patient Education  2017 [employer].

## 2016-11-07 NOTE — Assessment & Plan Note (Signed)
Check labs  con't meds Pt not checking BS levels

## 2016-11-07 NOTE — Assessment & Plan Note (Signed)
Well controlled, no changes to meds. Encouraged heart healthy diet such as the DASH diet and exercise as tolerated.  °

## 2016-12-10 ENCOUNTER — Other Ambulatory Visit: Payer: Self-pay | Admitting: Family Medicine

## 2016-12-10 DIAGNOSIS — E785 Hyperlipidemia, unspecified: Secondary | ICD-10-CM

## 2017-01-11 ENCOUNTER — Telehealth: Payer: Self-pay | Admitting: Family Medicine

## 2017-01-11 NOTE — Telephone Encounter (Signed)
Caller name: Gashi,Francis C Relation to RU:EAVWUJpt:spouse  Call back number: [phone] Pharmacy: [id number] [address], [place] - [address] [phone] (Phone) [phone] (Fax)     Reason for call:  Spouse requesting 90 day supply  glimepiride (AMARYL) 5 MG, spouse states PCP increased MG from 2 to 5 and would like to ensure new rx reflects the change, please advise

## 2017-01-16 MED ORDER — GLIMEPIRIDE 4 MG PO TABS: 4.0000 mg | ORAL_TABLET | Freq: Every day | ORAL | 1 refills | Status: DC

## 2017-01-16 NOTE — Telephone Encounter (Signed)
rx sent in and patient notified. 

## 2017-02-04 ENCOUNTER — Other Ambulatory Visit: Payer: Self-pay | Admitting: Family Medicine

## 2017-02-25 IMAGING — CR DG CHEST 2V
2 series · 2 of 2 positions shown · non-contrast
Comparison: 06/02/2008

CLINICAL DATA: Cough, wheezing for hours

EXAM:
CHEST  2 VIEW

[w chest pa]
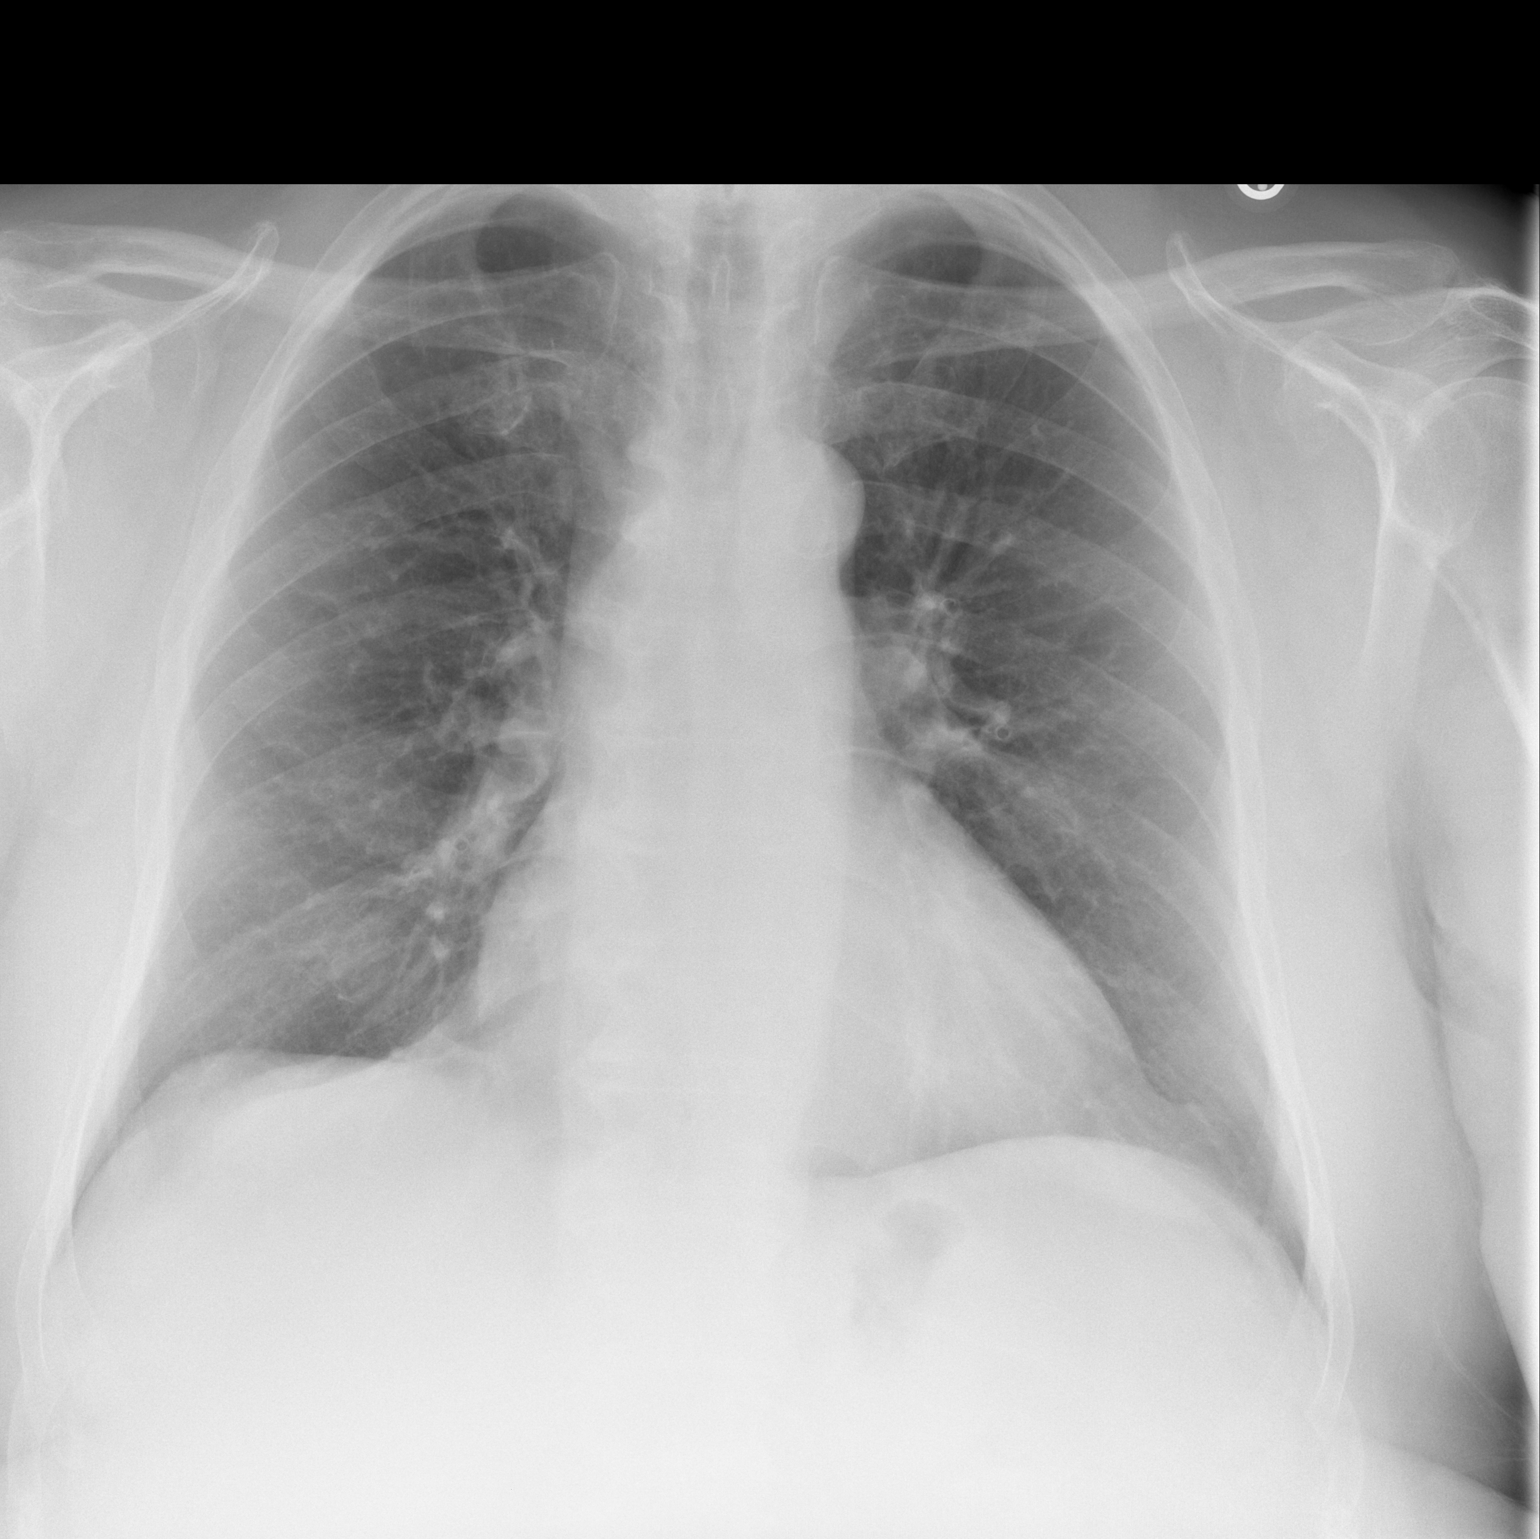

[w chest lat]
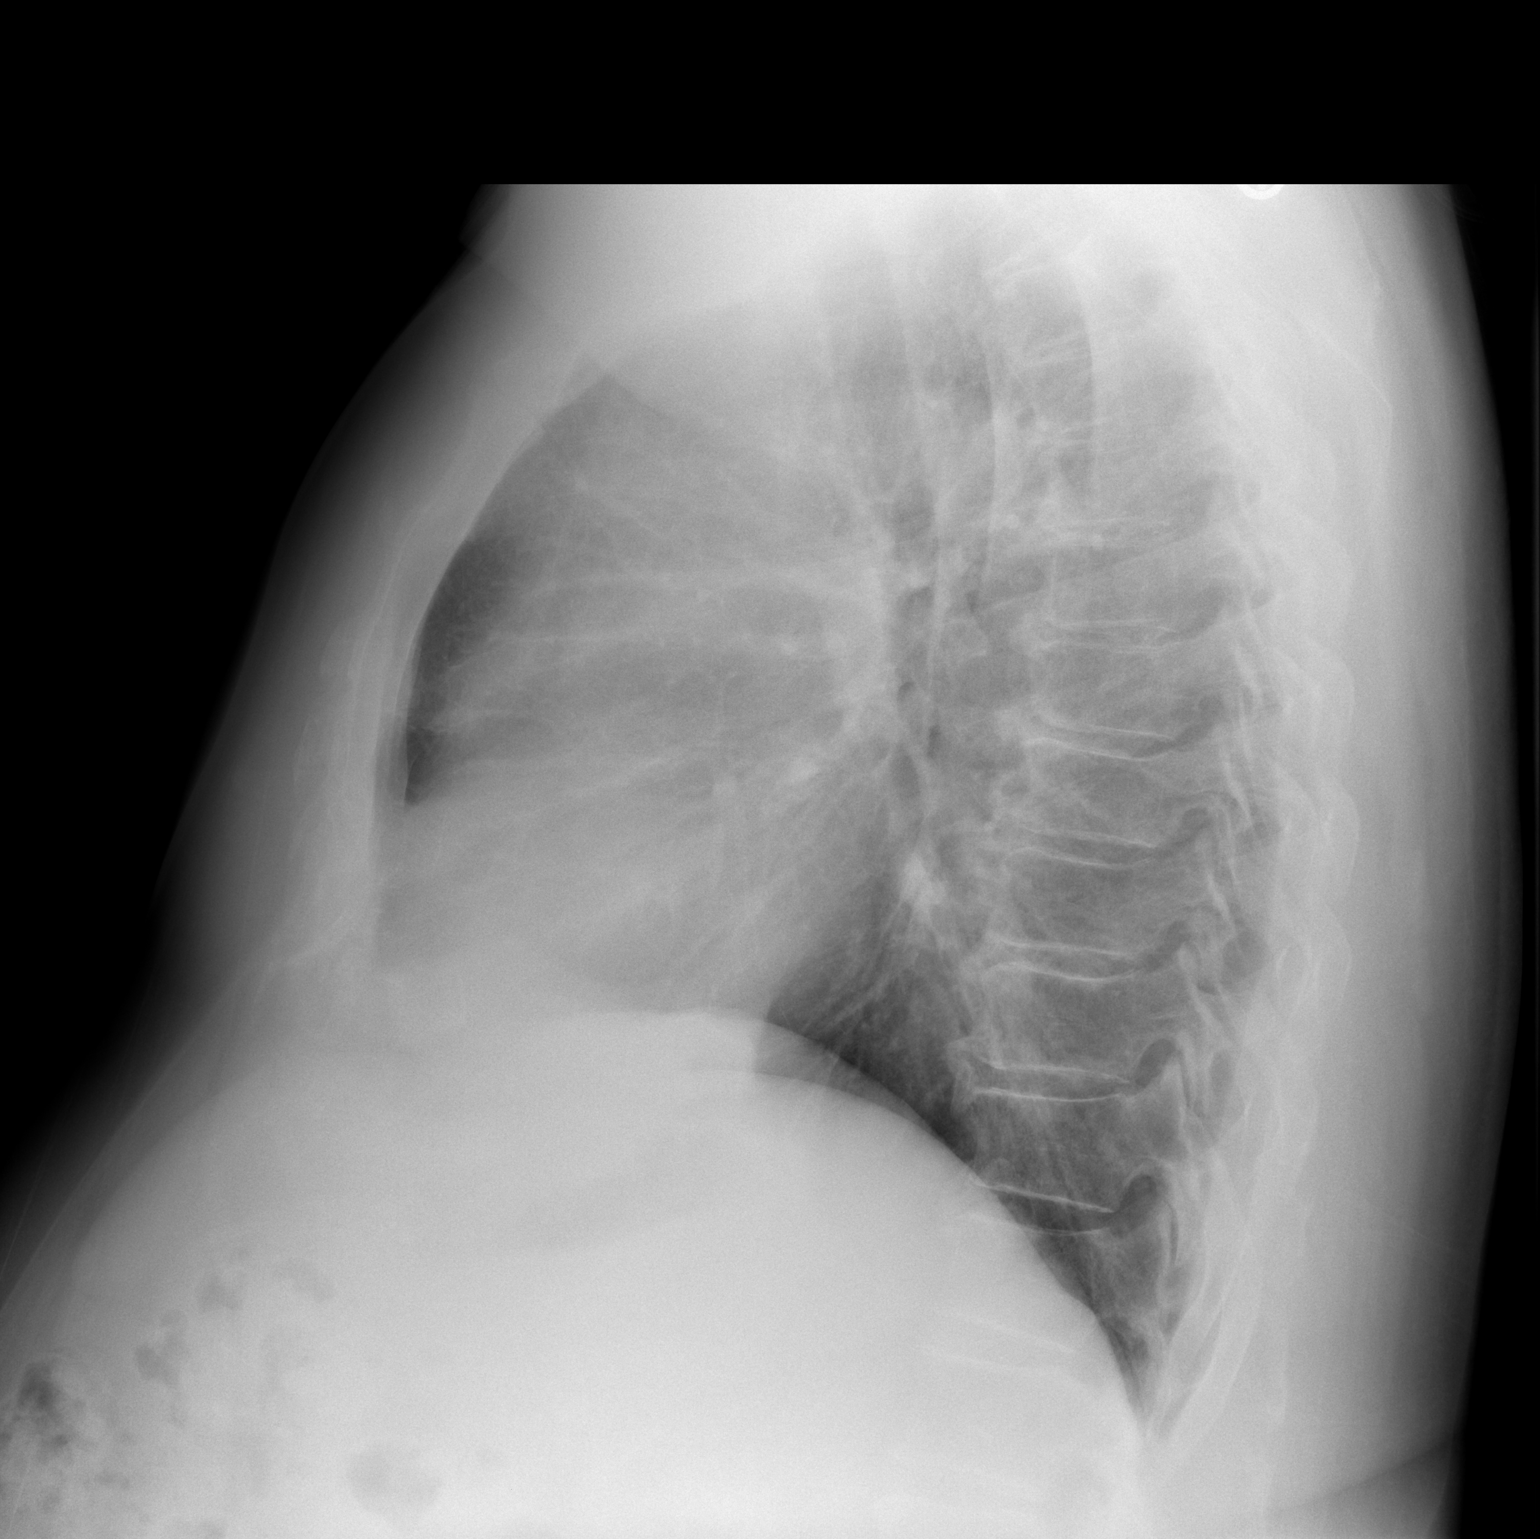

[2 of 2 positions shown; findings below may reference images not displayed]

FINDINGS: The heart size and mediastinal contours are within normal limits.
Both lungs are clear. The visualized skeletal structures are
unremarkable.
IMPRESSION: No active cardiopulmonary disease.

## 2017-03-10 ENCOUNTER — Other Ambulatory Visit: Payer: Self-pay | Admitting: Family Medicine

## 2017-03-10 DIAGNOSIS — E785 Hyperlipidemia, unspecified: Secondary | ICD-10-CM

## 2017-03-10 DIAGNOSIS — I10 Essential (primary) hypertension: Secondary | ICD-10-CM

## 2017-03-26 ENCOUNTER — Other Ambulatory Visit: Payer: Self-pay | Admitting: Family Medicine

## 2017-05-08 ENCOUNTER — Ambulatory Visit (INDEPENDENT_AMBULATORY_CARE_PROVIDER_SITE_OTHER): Payer: Medicare Other | Admitting: Family Medicine

## 2017-05-08 ENCOUNTER — Encounter: Payer: Self-pay | Admitting: Family Medicine

## 2017-05-08 VITALS — BP 116/62 | HR 66 | Temp 97.9°F | Resp 16 | Ht 69.69 in | Wt 316.6 lb

## 2017-05-08 DIAGNOSIS — D649 Anemia, unspecified: Secondary | ICD-10-CM | POA: Diagnosis not present

## 2017-05-08 DIAGNOSIS — E785 Hyperlipidemia, unspecified: Secondary | ICD-10-CM

## 2017-05-08 DIAGNOSIS — I10 Essential (primary) hypertension: Secondary | ICD-10-CM | POA: Diagnosis not present

## 2017-05-08 DIAGNOSIS — E1165 Type 2 diabetes mellitus with hyperglycemia: Secondary | ICD-10-CM

## 2017-05-08 DIAGNOSIS — E1151 Type 2 diabetes mellitus with diabetic peripheral angiopathy without gangrene: Secondary | ICD-10-CM | POA: Diagnosis not present

## 2017-05-08 DIAGNOSIS — IMO0002 Reserved for concepts with insufficient information to code with codable children: Secondary | ICD-10-CM

## 2017-05-08 DIAGNOSIS — Z0000 Encounter for general adult medical examination without abnormal findings: Secondary | ICD-10-CM | POA: Diagnosis not present

## 2017-05-08 DIAGNOSIS — G4733 Obstructive sleep apnea (adult) (pediatric): Secondary | ICD-10-CM

## 2017-05-08 LAB — IBC PANEL
Iron: 102 ug/dL (ref 42–165)
Saturation Ratios: 19.9 % — ABNORMAL LOW (ref 20.0–50.0)
Transferrin: 367 mg/dL — ABNORMAL HIGH (ref 212.0–360.0)

## 2017-05-08 LAB — CBC WITH DIFFERENTIAL/PLATELET
Basophils Absolute: 0.1 10*3/uL (ref 0.0–0.1)
Basophils Relative: 1.1 % (ref 0.0–3.0)
Eosinophils Absolute: 0.6 10*3/uL (ref 0.0–0.7)
Eosinophils Relative: 11.8 % — ABNORMAL HIGH (ref 0.0–5.0)
HCT: 38.1 % — ABNORMAL LOW (ref 39.0–52.0)
Hemoglobin: 12.7 g/dL — ABNORMAL LOW (ref 13.0–17.0)
Lymphocytes Relative: 18.9 % (ref 12.0–46.0)
Lymphs Abs: 1 10*3/uL (ref 0.7–4.0)
MCHC: 33.3 g/dL (ref 30.0–36.0)
MCV: 88.4 fl (ref 78.0–100.0)
Monocytes Absolute: 0.5 10*3/uL (ref 0.1–1.0)
Monocytes Relative: 10 % (ref 3.0–12.0)
Neutro Abs: 3.1 10*3/uL (ref 1.4–7.7)
Neutrophils Relative %: 58.2 % (ref 43.0–77.0)
Platelets: 228 10*3/uL (ref 150.0–400.0)
RBC: 4.31 Mil/uL (ref 4.22–5.81)
RDW: 14 % (ref 11.5–15.5)
WBC: 5.4 10*3/uL (ref 4.0–10.5)

## 2017-05-08 LAB — LIPID PANEL
Cholesterol: 142 mg/dL (ref 0–200)
HDL: 67.8 mg/dL (ref >39.00)
LDL Cholesterol: 62 mg/dL (ref 0–99)
NonHDL: 74.27
Total CHOL/HDL Ratio: 2
Triglycerides: 63 mg/dL (ref 0.0–149.0)
VLDL: 12.6 mg/dL (ref 0.0–40.0)

## 2017-05-08 LAB — HEMOGLOBIN A1C: Hgb A1c MFr Bld: 7.5 % — ABNORMAL HIGH (ref 4.6–6.5)

## 2017-05-08 LAB — COMPREHENSIVE METABOLIC PANEL
ALT: 13 U/L (ref 0–53)
AST: 15 U/L (ref 0–37)
Albumin: 4.2 g/dL (ref 3.5–5.2)
Alkaline Phosphatase: 27 U/L — ABNORMAL LOW (ref 39–117)
BUN: 22 mg/dL (ref 6–23)
CO2: 27 mEq/L (ref 19–32)
Calcium: 9.8 mg/dL (ref 8.4–10.5)
Chloride: 106 mEq/L (ref 96–112)
Creatinine, Ser: 1.02 mg/dL (ref 0.40–1.50)
GFR: 77.33 mL/min (ref >60.00)
Glucose, Bld: 132 mg/dL — ABNORMAL HIGH (ref 70–99)
Potassium: 4.3 mEq/L (ref 3.5–5.1)
Sodium: 138 mEq/L (ref 135–145)
Total Bilirubin: 0.3 mg/dL (ref 0.2–1.2)
Total Protein: 6.8 g/dL (ref 6.0–8.3)

## 2017-05-08 LAB — IRON: Iron: 102 ug/dL (ref 42–165)

## 2017-05-08 MED ORDER — FENOFIBRATE 160 MG PO TABS: 160.0000 mg | ORAL_TABLET | Freq: Every day | ORAL | 1 refills | Status: DC

## 2017-05-08 MED ORDER — CARVEDILOL 6.25 MG PO TABS: 6.2500 mg | ORAL_TABLET | Freq: Two times a day (BID) | ORAL | 1 refills | Status: DC

## 2017-05-08 MED ORDER — LISINOPRIL 10 MG PO TABS: 10.0000 mg | ORAL_TABLET | Freq: Every day | ORAL | 1 refills | Status: DC

## 2017-05-08 MED ORDER — SIMVASTATIN 40 MG PO TABS: 40.0000 mg | ORAL_TABLET | Freq: Every day | ORAL | 1 refills | Status: DC

## 2017-05-08 NOTE — Assessment & Plan Note (Signed)
Well controlled, no changes to meds. Encouraged heart healthy diet such as the DASH diet and exercise as tolerated.  °

## 2017-05-08 NOTE — Assessment & Plan Note (Signed)
hgba1c to be checked, minimize simple carbs. Increase exercise as tolerated. Continue current meds Pt will get lancets and strips and start checking at home

## 2017-05-08 NOTE — Assessment & Plan Note (Signed)
Tolerating statin, encouraged heart healthy diet, avoid trans fats, minimize simple carbs and saturated fats. Increase exercise as tolerated 

## 2017-05-08 NOTE — Progress Notes (Signed)
Subjective:  I acted as a Neurosurgeon for [employer]. Fuller Song, RMA   Patient ID: Zachary Michael, male    DOB: May 27, 1950, 67 y.o.   MRN: [id number]  Chief Complaint  Patient presents with  . Follow-up   HPI  Patient is in today for follow up visit.-- dm, cholesterol and bp.  No complaints  HYPERTENSION   Blood pressure range-not checking   Chest pain- no      Dyspnea- no Lightheadedness- no   Edema- yes Other side effects - no   Medication compliance: good Low salt diet- yes    DIABETES    Blood Sugar ranges-not checking   Polyuria- no New Visual problems- no  Hypoglycemic symptoms- no  Other side effects-no Medication compliance - good Last eye exam- due Foot exam- today   HYPERLIPIDEMIA  Medication compliance- good RUQ pain- no  Muscle aches- no Other side effects-no    Patient Care Team: Donato Schultz, DO as PCP - General Norva Pavlov, OD as Consulting Physician (Optometry) Nahser, Deloris Ping, MD as Consulting Physician (Cardiology)   Past Medical History:  Diagnosis Date  . Asthma   . CAD (coronary artery disease)    Mild  . Diabetes mellitus   . HTN (hypertension)   . Hyperlipidemia     Past Surgical History:  Procedure Laterality Date  . APPENDECTOMY    . CARDIAC CATHETERIZATION     We recently performed a which revealed minor coronary artery irregularities. He had normal left ventricle systolic function with an EF of 60%  . CARDIOVASCULAR STRESS TEST     EF 63%  . TONSILLECTOMY    . TUMOR REMOVAL     LEFT ARM    Family History  Problem Relation Age of Onset  . Pneumonia Mother   . Osteoporosis Mother   . Arthritis Mother   . Diabetes Mother   . Hypertension Mother   . Cancer Father        leukemia, lung  . Cancer Brother   . Diabetes Maternal Aunt   . Hypertension Maternal Aunt   . Hypertension Maternal Aunt   . Lung cancer Unknown     Social History   Socioeconomic History  . Marital status: Married    Spouse  name: Not on file  . Number of children: 3  . Years of education: Not on file  . Highest education level: Not on file  Social Needs  . Financial resource strain: Not on file  . Food insecurity - worry: Not on file  . Food insecurity - inability: Not on file  . Transportation needs - medical: Not on file  . Transportation needs - non-medical: Not on file  Occupational History  . 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 Smoking status: Former Smoker    Years: 7.00    Types: Pipe    Last attempt to quit: 1980    Years since quitting: 39.2  . Smokeless tobacco: Never Used  Substance and Sexual Activity  . Alcohol use: Yes    Comment: occassional  . Drug use: No  . Sexual activity: Yes    Partners: Female  Other Topics Concern  . Not on file  Social History Narrative   Exercise-- walking at work    Outpatient Medications Prior to Visit  Medication Sig Dispense Refill  . albuterol (PROAIR HFA) 108 (90 BASE) MCG/ACT inhaler Inhale 2 puffs into the lungs every 6 (six) hours as needed for wheezing or shortness of  breath. 1 Inhaler 4  . aspirin 81 MG tablet Take 81 mg by mouth daily.      [name] glimepiride (AMARYL) 4 MG tablet Take 1 tablet (4 mg total) daily with breakfast by mouth. 90 tablet 1  . metFORMIN (GLUCOPHAGE) 1000 MG tablet Take 1 tablet (1,000 mg total) by mouth 2 (two) times daily with a meal. 28 tablet 0  . metFORMIN (GLUCOPHAGE) 1000 MG tablet TAKE 1 TABLET TWICE A DAY  WITH MEALS 180 tablet 1  . nitroGLYCERIN (NITROSTAT) 0.4 MG SL tablet Place 1 tablet (0.4 mg total) under the tongue every 5 (five) minutes as needed for chest pain. 25 tablet 5  . pioglitazone (ACTOS) 45 MG tablet TAKE 1 TABLET DAILY 90 tablet 0  . carvedilol (COREG) 6.25 MG tablet TAKE 1 TABLET TWICE DAILY  WITH MEALS 180 tablet 2  . ezetimibe (ZETIA) 10 MG tablet Take 1 tablet (10 mg total) by mouth daily.    . fenofibrate 160 MG tablet TAKE 1 TABLET DAILY 90 tablet 0  . lisinopril  (PRINIVIL,ZESTRIL) 10 MG tablet Take 1 tablet (10 mg total) by mouth daily. 14 tablet 0  . lisinopril (PRINIVIL,ZESTRIL) 10 MG tablet TAKE 1 TABLET DAILY 90 tablet 1  . simvastatin (ZOCOR) 40 MG tablet TAKE 1 TABLET AT BEDTIME 90 tablet 0  . beclomethasone (QVAR) 40 MCG/ACT inhaler Inhale 2 puffs into the lungs 2 (two) times daily. (Patient not taking: Reported on 05/08/2017) 3 Inhaler 2   No facility-administered medications prior to visit.     No Known Allergies  Review of Systems  Constitutional: Negative for chills, fever and malaise/fatigue.  HENT: Negative for congestion and hearing loss.   Eyes: Negative for discharge.  Respiratory: Negative for cough, sputum production and shortness of breath.   Cardiovascular: Negative for chest pain, palpitations and leg swelling.  Gastrointestinal: Negative for abdominal pain, blood in stool, constipation, diarrhea, heartburn, nausea and vomiting.  Genitourinary: Negative for dysuria, frequency, hematuria and urgency.  Musculoskeletal: Negative for back pain, falls and myalgias.  Skin: Negative for rash.  Neurological: Negative for dizziness, sensory change, loss of consciousness, weakness and headaches.  Endo/Heme/Allergies: Negative for environmental allergies. Does not bruise/bleed easily.  Psychiatric/Behavioral: Negative for depression and suicidal ideas. The patient is not nervous/anxious and does not have insomnia.        Objective:    Physical Exam  Constitutional: He is oriented to person, place, and time. Vital signs are normal. He appears well-developed and well-nourished. He is sleeping.  HENT:  Head: Normocephalic and atraumatic.  Mouth/Throat: Oropharynx is clear and moist.  Eyes: EOM are normal. Pupils are equal, round, and reactive to light.  Neck: Normal range of motion. Neck supple. No thyromegaly present.  Cardiovascular: Normal rate and regular rhythm.  No murmur heard. Pulmonary/Chest: Effort normal and breath  sounds normal. No respiratory distress. He has no wheezes. He has no rales. He exhibits no tenderness.  Musculoskeletal: He exhibits no edema or tenderness.  Sensory exam of the foot is normal, tested with the monofilament. Good pulses, no lesions or ulcers, good peripheral pulses.  Neurological: He is alert and oriented to person, place, and time.  Skin: Skin is warm and dry.  Psychiatric: He has a normal mood and affect. His behavior is normal. Judgment and thought content normal.  Nursing note and vitals reviewed.   BP 116/62 (BP Location: Left Arm, Patient Position: Sitting, Cuff Size: Large)   Pulse 66   Temp 97.9 F (36.6 C) (Oral)  Resp 16   Ht 5' 9.69" (1.77 m)   Wt (!) 316 lb 9.6 oz (143.6 kg)   SpO2 97%   BMI 45.84 kg/m  Wt Readings from Last 3 Encounters:  05/08/17 (!) 316 lb 9.6 oz (143.6 kg)  11/07/16 (!) 311 lb 6.4 oz (141.3 kg)  07/15/16 (!) 312 lb 3.2 oz (141.6 kg)   BP Readings from Last 3 Encounters:  05/08/17 116/62  11/07/16 (!) 112/58  07/15/16 114/70     Immunization History  Administered Date(s) Administered  . Pneumococcal Conjugate-13 05/11/2015  . Pneumococcal Polysaccharide-23 01/07/2013  . Tdap 09/23/2010  . Zoster 09/23/2010    Health Maintenance  Topic Date Due  . Hepatitis C Screening  1950/07/25  . OPHTHALMOLOGY EXAM  08/20/2015  . COLONOSCOPY  09/23/2015  . HEMOGLOBIN A1C  05/07/2017  . INFLUENZA VACCINE  05/28/2017 (Originally 09/28/2016)  . PNA vac Low Risk Adult (2 of 2 - PPSV23) 01/07/2018  . FOOT EXAM  05/09/2018  . TETANUS/TDAP  09/22/2020    Lab Results  Component Value Date   WBC 6.3 11/07/2016   HGB 11.9 (L) 11/07/2016   HCT 35.8 (L) 11/07/2016   PLT 271.0 11/07/2016   GLUCOSE 133 (H) 11/07/2016   CHOL 134 11/07/2016   TRIG 70.0 11/07/2016   HDL 57.00 11/07/2016   LDLDIRECT 198.6 03/20/2009   LDLCALC 63 11/07/2016   ALT 14 11/07/2016   AST 17 11/07/2016   NA 139 11/07/2016   K 4.6 11/07/2016   CL 103 11/07/2016     CREATININE 1.05 11/07/2016   BUN 21 11/07/2016   CO2 27 11/07/2016   TSH 1.28 11/07/2016   PSA 3.68 11/07/2016   INR 1.0 06/02/2008   HGBA1C 6.7 (H) 11/07/2016   MICROALBUR 1.2 11/06/2014    Lab Results  Component Value Date   TSH 1.28 11/07/2016   Lab Results  Component Value Date   WBC 6.3 11/07/2016   HGB 11.9 (L) 11/07/2016   HCT 35.8 (L) 11/07/2016   MCV 89.0 11/07/2016   PLT 271.0 11/07/2016   Lab Results  Component Value Date   NA 139 11/07/2016   K 4.6 11/07/2016   CO2 27 11/07/2016   GLUCOSE 133 (H) 11/07/2016   BUN 21 11/07/2016   CREATININE 1.05 11/07/2016   BILITOT 0.4 11/07/2016   ALKPHOS 24 (L) 11/07/2016   AST 17 11/07/2016   ALT 14 11/07/2016   PROT 6.3 11/07/2016   ALBUMIN 4.1 11/07/2016   CALCIUM 9.8 11/07/2016   GFR 74.89 11/07/2016   Lab Results  Component Value Date   CHOL 134 11/07/2016   Lab Results  Component Value Date   HDL 57.00 11/07/2016   Lab Results  Component Value Date   LDLCALC 63 11/07/2016   Lab Results  Component Value Date   TRIG 70.0 11/07/2016   Lab Results  Component Value Date   CHOLHDL 2 11/07/2016   Lab Results  Component Value Date   HGBA1C 6.7 (H) 11/07/2016         Assessment & Plan:   Problem List Items Addressed This Visit      Unprioritized   Diabetes mellitus type II, uncontrolled (HCC)    hgba1c to be checked, minimize simple carbs. Increase exercise as tolerated. Continue current meds Pt will get lancets and strips and start checking at home      Relevant Medications   lisinopril (PRINIVIL,ZESTRIL) 10 MG tablet   simvastatin (ZOCOR) 40 MG tablet   Hyperlipidemia    Tolerating  statin, encouraged heart healthy diet, avoid trans fats, minimize simple carbs and saturated fats. Increase exercise as tolerated      Relevant Medications   lisinopril (PRINIVIL,ZESTRIL) 10 MG tablet   carvedilol (COREG) 6.25 MG tablet   fenofibrate 160 MG tablet   simvastatin (ZOCOR) 40 MG tablet    Hypertension - Primary    Well controlled, no changes to meds. Encouraged heart healthy diet such as the DASH diet and exercise as tolerated.       Relevant Medications   lisinopril (PRINIVIL,ZESTRIL) 10 MG tablet   carvedilol (COREG) 6.25 MG tablet   fenofibrate 160 MG tablet   simvastatin (ZOCOR) 40 MG tablet   Other Relevant Orders   CBC with Differential/Platelet   Hemoglobin A1c   Comprehensive metabolic panel   IBC panel   Lipid panel   Ferritin   Iron   Preventative health care   Relevant Orders   Ambulatory referral to Gastroenterology    Other Visit Diagnoses    Hyperlipidemia LDL goal <70       Relevant Medications   lisinopril (PRINIVIL,ZESTRIL) 10 MG tablet   carvedilol (COREG) 6.25 MG tablet   fenofibrate 160 MG tablet   simvastatin (ZOCOR) 40 MG tablet   Other Relevant Orders   CBC with Differential/Platelet   Hemoglobin A1c   Comprehensive metabolic panel   IBC panel   Lipid panel   Ferritin   Iron   DM (diabetes mellitus) type II uncontrolled, periph vascular disorder (HCC)       Relevant Medications   lisinopril (PRINIVIL,ZESTRIL) 10 MG tablet   carvedilol (COREG) 6.25 MG tablet   fenofibrate 160 MG tablet   simvastatin (ZOCOR) 40 MG tablet   Other Relevant Orders   CBC with Differential/Platelet   Hemoglobin A1c   Comprehensive metabolic panel   IBC panel   Lipid panel   Ferritin   Iron   Ambulatory referral to Ophthalmology   Anemia, unspecified type       Relevant Orders   CBC with Differential/Platelet   IBC panel   Ferritin   Iron   OSA (obstructive sleep apnea)       Relevant Orders   Ambulatory referral to Pulmonology      I have discontinued Celesta Gentile. Cirigliano's ezetimibe. I have also changed his carvedilol, fenofibrate, and simvastatin. Additionally, I am having him maintain his aspirin, nitroGLYCERIN, albuterol, beclomethasone, metFORMIN, glimepiride, metFORMIN, pioglitazone, and lisinopril.  Meds ordered this encounter    Medications  . lisinopril (PRINIVIL,ZESTRIL) 10 MG tablet    Sig: Take 1 tablet (10 mg total) by mouth daily.    Dispense:  90 tablet    Refill:  1  . carvedilol (COREG) 6.25 MG tablet    Sig: Take 1 tablet (6.25 mg total) by mouth 2 (two) times daily with a meal.    Dispense:  180 tablet    Refill:  1  . fenofibrate 160 MG tablet    Sig: Take 1 tablet (160 mg total) by mouth daily.    Dispense:  90 tablet    Refill:  1  . simvastatin (ZOCOR) 40 MG tablet    Sig: Take 1 tablet (40 mg total) by mouth at bedtime.    Dispense:  90 tablet    Refill:  1    CMA served as Neurosurgeon during this visit. History, Physical and Plan performed by medical provider. Documentation and orders reviewed and attested to.  Donato Schultz, DO

## 2017-05-08 NOTE — Patient Instructions (Signed)

## 2017-05-09 LAB — FERRITIN: Ferritin: 64 ng/mL (ref 22.0–322.0)

## 2017-05-10 ENCOUNTER — Other Ambulatory Visit: Payer: Self-pay | Admitting: *Deleted

## 2017-05-11 ENCOUNTER — Other Ambulatory Visit: Payer: Self-pay | Admitting: *Deleted

## 2017-05-11 DIAGNOSIS — E1165 Type 2 diabetes mellitus with hyperglycemia: Secondary | ICD-10-CM

## 2017-05-11 DIAGNOSIS — E785 Hyperlipidemia, unspecified: Secondary | ICD-10-CM

## 2017-05-11 DIAGNOSIS — I10 Essential (primary) hypertension: Secondary | ICD-10-CM

## 2017-05-12 ENCOUNTER — Telehealth: Payer: Self-pay | Admitting: Pulmonary Disease

## 2017-05-12 NOTE — Telephone Encounter (Signed)
Spoke with pt. He has been scheduled to see VS on 06/26/17 at 2:45pm for an ROV. Nothing further was needed.

## 2017-05-23 ENCOUNTER — Encounter: Payer: Self-pay | Admitting: Family Medicine

## 2017-05-23 NOTE — Telephone Encounter (Signed)
There is no way to know-- most likely more than 3 months---  We would need to see his formulary for diabetic meds to pick a different one if he can not stay on it long term

## 2017-06-26 ENCOUNTER — Encounter: Payer: Self-pay | Admitting: Pulmonary Disease

## 2017-06-26 ENCOUNTER — Ambulatory Visit (INDEPENDENT_AMBULATORY_CARE_PROVIDER_SITE_OTHER): Payer: Medicare Other | Admitting: Pulmonary Disease

## 2017-06-26 VITALS — BP 118/62 | HR 68 | Ht 70.0 in | Wt 303.0 lb

## 2017-06-26 DIAGNOSIS — Z6841 Body Mass Index (BMI) 40.0 and over, adult: Secondary | ICD-10-CM

## 2017-06-26 DIAGNOSIS — R0683 Snoring: Secondary | ICD-10-CM

## 2017-06-26 NOTE — Patient Instructions (Signed)
Will arrange for home sleep study Will call to arrange for follow up after sleep study reviewed  

## 2017-06-26 NOTE — Progress Notes (Signed)
[facility]  Chief Complaint  Patient presents with  . Follow-up    Was suppose to get sleep study but never did.    Vital signs: BP 118/62 (BP Location: Left Arm, Cuff Size: Normal)   Pulse 68   Ht  (1.778 m)   Wt (!) 303 lb (137.4 kg)   SpO2 95%   BMI 43.48 kg/m   History of Present Illness: Zachary Michael is a 67 y.o. male with snoring.  I saw him last year.  He was to have sleep study set up.  His phone didn't work and he never received call to schedule sleep study.  He still snores.  He gets about 4 hours sleep at a time and then wakes up to see if wife and children off to school.  He goes back to sleep for another 2 to 3 hours.  He then takes a nap in the afternoon when his wife comes back from work.  His weight is about the same as last year.  Physical Exam:  General - pleasant Eyes - pupils reactive ENT - no sinus tenderness, no oral exudate, no LAN, MP 4, scalloped tongue Cardiac - regular, no murmur Chest - no wheeze, rales Abd - soft, non tender Ext - no edema Skin - no rashes Neuro - normal strength Psych - normal mood  Assessment/Plan:  Snoring. - he has hx of HTN, CAD, DM - I am concerned he could have sleep apnea - will arrange for home sleep study  Obesity. - discussed importance of weight loss   Patient Instructions  Will arrange for home sleep study Will call to arrange for follow up after sleep study reviewed     Coralyn Helling, MD [facility] 06/26/2017, 3:08 PM  Flow Sheet  Sleep tests:  Past Medical History: He  has a past medical history of Asthma, CAD (coronary artery disease), Diabetes mellitus, HTN (hypertension), and Hyperlipidemia.  Past Surgical History: He  has a past surgical history that includes Appendectomy; Tonsillectomy; Tumor removal; Cardiac catheterization; and Cardiovascular stress test.  Family History: His family history includes Arthritis in  his mother; Cancer in his brother and father; Diabetes in his maternal aunt and mother; Hypertension in his maternal aunt, maternal aunt, and mother; Lung cancer in his unknown relative; Osteoporosis in his mother; Pneumonia in his mother.  Social History: He  reports that he quit smoking about 39 years ago. His smoking use included pipe. He quit after 7.00 years of use. He has never used smokeless tobacco. He reports that he drinks alcohol. He reports that he does not use drugs.  Medications: Allergies as of 06/26/2017   No Known Allergies     Medication List        Accurate as of 06/26/17  3:08 PM. Always use your most recent med list.          albuterol 108 (90 Base) MCG/ACT inhaler Commonly known as:  PROAIR HFA Inhale 2 puffs into the lungs every 6 (six) hours as needed for wheezing or shortness of breath.   aspirin 81 MG tablet Take 81 mg by mouth daily.   beclomethasone 40 MCG/ACT inhaler Commonly known as:  QVAR Inhale 2 puffs into the lungs 2 (two) times daily.   carvedilol 6.25 MG tablet Commonly known as:  COREG Take 1 tablet (6.25 mg total) by mouth 2 (two) times daily with a meal.   fenofibrate 160 MG tablet Take 1 tablet (160  mg total) by mouth daily.   glimepiride 4 MG tablet Commonly known as:  AMARYL Take 1 tablet (4 mg total) daily with breakfast by mouth.   lisinopril 10 MG tablet Commonly known as:  PRINIVIL,ZESTRIL Take 1 tablet (10 mg total) by mouth daily.   metFORMIN 1000 MG tablet Commonly known as:  GLUCOPHAGE Take 1 tablet (1,000 mg total) by mouth 2 (two) times daily with a meal.   metFORMIN 1000 MG tablet Commonly known as:  GLUCOPHAGE TAKE 1 TABLET TWICE A DAY  WITH MEALS   nitroGLYCERIN 0.4 MG SL tablet Commonly known as:  NITROSTAT Place 1 tablet (0.4 mg total) under the tongue every 5 (five) minutes as needed for chest pain.   pioglitazone 45 MG tablet Commonly known as:  ACTOS TAKE 1 TABLET DAILY   simvastatin 40 MG  tablet Commonly known as:  ZOCOR Take 1 tablet (40 mg total) by mouth at bedtime.

## 2017-07-16 ENCOUNTER — Other Ambulatory Visit: Payer: Self-pay | Admitting: Family Medicine

## 2017-08-01 ENCOUNTER — Other Ambulatory Visit (INDEPENDENT_AMBULATORY_CARE_PROVIDER_SITE_OTHER): Payer: Medicare Other

## 2017-08-01 ENCOUNTER — Telehealth: Payer: Self-pay | Admitting: Pulmonary Disease

## 2017-08-01 DIAGNOSIS — E785 Hyperlipidemia, unspecified: Secondary | ICD-10-CM | POA: Diagnosis not present

## 2017-08-01 DIAGNOSIS — R0683 Snoring: Secondary | ICD-10-CM

## 2017-08-01 DIAGNOSIS — I10 Essential (primary) hypertension: Secondary | ICD-10-CM

## 2017-08-01 DIAGNOSIS — E1165 Type 2 diabetes mellitus with hyperglycemia: Secondary | ICD-10-CM

## 2017-08-01 LAB — COMPREHENSIVE METABOLIC PANEL
ALT: 13 U/L (ref 0–53)
AST: 17 U/L (ref 0–37)
Albumin: 4.4 g/dL (ref 3.5–5.2)
Alkaline Phosphatase: 25 U/L — ABNORMAL LOW (ref 39–117)
BUN: 23 mg/dL (ref 6–23)
CO2: 28 mEq/L (ref 19–32)
Calcium: 9.8 mg/dL (ref 8.4–10.5)
Chloride: 104 mEq/L (ref 96–112)
Creatinine, Ser: 1.09 mg/dL (ref 0.40–1.50)
GFR: 71.57 mL/min (ref >60.00)
Glucose, Bld: 104 mg/dL — ABNORMAL HIGH (ref 70–99)
Potassium: 4.4 mEq/L (ref 3.5–5.1)
Sodium: 140 mEq/L (ref 135–145)
Total Bilirubin: 0.5 mg/dL (ref 0.2–1.2)
Total Protein: 6.4 g/dL (ref 6.0–8.3)

## 2017-08-01 LAB — LIPID PANEL
Cholesterol: 125 mg/dL (ref 0–200)
HDL: 60 mg/dL (ref >39.00)
LDL Cholesterol: 53 mg/dL (ref 0–99)
NonHDL: 64.59
Total CHOL/HDL Ratio: 2
Triglycerides: 60 mg/dL (ref 0.0–149.0)
VLDL: 12 mg/dL (ref 0.0–40.0)

## 2017-08-01 LAB — HEMOGLOBIN A1C: Hgb A1c MFr Bld: 6.9 % — ABNORMAL HIGH (ref 4.6–6.5)

## 2017-08-01 NOTE — Addendum Note (Signed)
Addended by: Verdie ShireBAYNES, ANGELA M on: 08/01/2017 01:33 PM   Modules accepted: Orders

## 2017-08-01 NOTE — Telephone Encounter (Signed)
I placed order for HST. Spoke with Angie, she released the order by mistake. FYI PCC's   Patient Instructions by Coralyn HellingSood, Vineet, MD at 06/26/2017 2:45 PM  Author: Coralyn HellingSood, Vineet, MD Author Type: Physician Filed: 06/26/2017 3:05 PM  Note Status: Signed Cosign: Cosign Not Required Encounter Date: 06/26/2017  Editor: Coralyn HellingSood, Vineet, MD (Physician)    Will arrange for home sleep study Will call to arrange for follow up after sleep study reviewed     Instructions   Will arrange for home sleep study Will call to arrange for follow up after sleep study reviewed

## 2017-08-04 DIAGNOSIS — G4733 Obstructive sleep apnea (adult) (pediatric): Secondary | ICD-10-CM | POA: Diagnosis not present

## 2017-08-08 ENCOUNTER — Other Ambulatory Visit: Payer: Self-pay | Admitting: *Deleted

## 2017-08-08 DIAGNOSIS — R0683 Snoring: Secondary | ICD-10-CM

## 2017-08-08 NOTE — Telephone Encounter (Signed)
Please advise if we can close this encounter.

## 2017-08-08 NOTE — Telephone Encounter (Signed)
Yes, note can be closed.  Order was put back in & pt has already been scheduled.

## 2017-08-09 ENCOUNTER — Encounter: Payer: Self-pay | Admitting: Pulmonary Disease

## 2017-08-09 ENCOUNTER — Telehealth: Payer: Self-pay | Admitting: Pulmonary Disease

## 2017-08-09 DIAGNOSIS — G4733 Obstructive sleep apnea (adult) (pediatric): Secondary | ICD-10-CM

## 2017-08-09 HISTORY — DX: Obstructive sleep apnea (adult) (pediatric): G47.33

## 2017-08-09 NOTE — Telephone Encounter (Signed)
HST 08/04/17 >> AHI 5.4, SaO2 low 91%.   Will have my nurse inform pt that sleep study shows mild sleep apnea.  Please schedule ROV with me or NP to discuss tx options in more detail.

## 2017-08-10 NOTE — Telephone Encounter (Signed)
Left message for patient to call back for results.  

## 2017-08-11 NOTE — Telephone Encounter (Signed)
Attempted to call patient today regarding results. I did not receive an answer at time of call. I have left a voicemail message for pt to return call. X2  

## 2017-08-14 NOTE — Telephone Encounter (Signed)
Attempted to call patient today regarding results. I did not receive an answer at time of call. I have left a voicemail message for pt to return call. X3 Mailed letter today in mail for pt to contact our office. Nothing further needed at this time.

## 2017-08-17 ENCOUNTER — Encounter: Payer: Self-pay | Admitting: Family Medicine

## 2017-09-11 ENCOUNTER — Other Ambulatory Visit: Payer: Self-pay | Admitting: Family Medicine

## 2017-10-27 ENCOUNTER — Other Ambulatory Visit: Payer: Self-pay | Admitting: *Deleted

## 2017-10-27 MED ORDER — METFORMIN HCL 1000 MG PO TABS: 1000.0000 mg | ORAL_TABLET | Freq: Two times a day (BID) | ORAL | 1 refills | Status: DC

## 2017-11-13 ENCOUNTER — Ambulatory Visit (INDEPENDENT_AMBULATORY_CARE_PROVIDER_SITE_OTHER): Payer: Medicare Other | Admitting: Family Medicine

## 2017-11-13 ENCOUNTER — Encounter: Payer: Self-pay | Admitting: Family Medicine

## 2017-11-13 VITALS — BP 127/55 | HR 65 | Temp 98.5°F | Resp 16 | Ht 70.0 in | Wt 316.0 lb

## 2017-11-13 DIAGNOSIS — E1165 Type 2 diabetes mellitus with hyperglycemia: Secondary | ICD-10-CM

## 2017-11-13 DIAGNOSIS — Z0000 Encounter for general adult medical examination without abnormal findings: Secondary | ICD-10-CM | POA: Diagnosis not present

## 2017-11-13 DIAGNOSIS — E785 Hyperlipidemia, unspecified: Secondary | ICD-10-CM | POA: Diagnosis not present

## 2017-11-13 DIAGNOSIS — E1151 Type 2 diabetes mellitus with diabetic peripheral angiopathy without gangrene: Secondary | ICD-10-CM | POA: Diagnosis not present

## 2017-11-13 DIAGNOSIS — I10 Essential (primary) hypertension: Secondary | ICD-10-CM | POA: Diagnosis not present

## 2017-11-13 DIAGNOSIS — IMO0002 Reserved for concepts with insufficient information to code with codable children: Secondary | ICD-10-CM

## 2017-11-13 LAB — COMPREHENSIVE METABOLIC PANEL
ALT: 14 U/L (ref 0–53)
AST: 17 U/L (ref 0–37)
Albumin: 4.3 g/dL (ref 3.5–5.2)
Alkaline Phosphatase: 32 U/L — ABNORMAL LOW (ref 39–117)
BUN: 24 mg/dL — ABNORMAL HIGH (ref 6–23)
CO2: 29 mEq/L (ref 19–32)
Calcium: 10.3 mg/dL (ref 8.4–10.5)
Chloride: 102 mEq/L (ref 96–112)
Creatinine, Ser: 1.02 mg/dL (ref 0.40–1.50)
GFR: 77.21 mL/min (ref >60.00)
Glucose, Bld: 117 mg/dL — ABNORMAL HIGH (ref 70–99)
Potassium: 4.2 mEq/L (ref 3.5–5.1)
Sodium: 137 mEq/L (ref 135–145)
Total Bilirubin: 0.4 mg/dL (ref 0.2–1.2)
Total Protein: 6.7 g/dL (ref 6.0–8.3)

## 2017-11-13 LAB — LIPID PANEL
Cholesterol: 139 mg/dL (ref 0–200)
HDL: 61 mg/dL (ref >39.00)
LDL Cholesterol: 66 mg/dL (ref 0–99)
NonHDL: 77.6
Total CHOL/HDL Ratio: 2
Triglycerides: 57 mg/dL (ref 0.0–149.0)
VLDL: 11.4 mg/dL (ref 0.0–40.0)

## 2017-11-13 LAB — HEMOGLOBIN A1C: Hgb A1c MFr Bld: 6.9 % — ABNORMAL HIGH (ref 4.6–6.5)

## 2017-11-13 MED ORDER — CARVEDILOL 6.25 MG PO TABS: 6.2500 mg | ORAL_TABLET | Freq: Two times a day (BID) | ORAL | 1 refills | Status: DC

## 2017-11-13 MED ORDER — GLIMEPIRIDE 4 MG PO TABS: 4.0000 mg | ORAL_TABLET | Freq: Every day | ORAL | 1 refills | Status: DC

## 2017-11-13 MED ORDER — FENOFIBRATE 160 MG PO TABS: 160.0000 mg | ORAL_TABLET | Freq: Every day | ORAL | 1 refills | Status: DC

## 2017-11-13 MED ORDER — METFORMIN HCL 1000 MG PO TABS: 1000.0000 mg | ORAL_TABLET | Freq: Two times a day (BID) | ORAL | 1 refills | Status: DC

## 2017-11-13 MED ORDER — PIOGLITAZONE HCL 45 MG PO TABS
45.0000 mg | ORAL_TABLET | Freq: Every day | ORAL | 1 refills | Status: DC
Start: 2017-11-13 — End: 2018-01-30

## 2017-11-13 MED ORDER — LISINOPRIL 10 MG PO TABS: 10.0000 mg | ORAL_TABLET | Freq: Every day | ORAL | 1 refills | Status: DC

## 2017-11-13 MED ORDER — SIMVASTATIN 40 MG PO TABS: 40.0000 mg | ORAL_TABLET | Freq: Every day | ORAL | 1 refills | Status: DC

## 2017-11-13 NOTE — Patient Instructions (Signed)

## 2017-11-13 NOTE — Assessment & Plan Note (Signed)
Well controlled, no changes to meds. Encouraged heart healthy diet such as the DASH diet and exercise as tolerated.  °

## 2017-11-13 NOTE — Assessment & Plan Note (Signed)
Tolerating statin, encouraged heart healthy diet, avoid trans fats, minimize simple carbs and saturated fats. Increase exercise as tolerated 

## 2017-11-13 NOTE — Assessment & Plan Note (Signed)
hgba1c to be checked, minimize simple carbs. Increase exercise as tolerated. Continue current meds  

## 2017-11-13 NOTE — Assessment & Plan Note (Signed)
Pt due for colonoscopy.

## 2017-11-13 NOTE — Progress Notes (Signed)
Patient ID: Zachary Michael, male    DOB: 1950/11/19  Age: 67 y.o. MRN: [id number]    Subjective:  Subjective  HPI DAYMOND CORDTS presents for f/u dm, chol and bp.      HYPERTENSION   Blood pressure range-not checking   Chest pain- no      Dyspnea- no Lightheadedness- no   Edema- no  Other side effects - no   Medication compliance: good Low salt diet- yes    DIABETES    Blood Sugar ranges-not checking   Polyuria- no New Visual problems- no  Hypoglycemic symptoms- no  Other side effects-no Medication compliance - good Last eye exam- last month Foot exam- today   HYPERLIPIDEMIA  Medication compliance- good  RUQ pain- no  Muscle aches- no Other side effects-no      Review of Systems Review of Systems  Constitutional: Negative for activity change, appetite change and fatigue.  HENT: Negative for hearing loss, congestion, tinnitus and ear discharge.  dentist q28m Eyes: Negative for visual disturbance (see optho q1y -- vision corrected to 20/20 with glasses).  Respiratory: Negative for cough, chest tightness and shortness of breath.   Cardiovascular: Negative for chest pain, palpitations and leg swelling.  Gastrointestinal: Negative for abdominal pain, diarrhea, constipation and abdominal distention.  Genitourinary: Negative for urgency, frequency, decreased urine volume and difficulty urinating.  Musculoskeletal: Negative for back pain, arthralgias and gait problem.  Skin: Negative for color change, pallor and rash.  Neurological: Negative for dizziness, light-headedness, numbness and headaches.  Hematological: Negative for adenopathy. Does not bruise/bleed easily.  Psychiatric/Behavioral: Negative for suicidal ideas, confusion, sleep disturbance, self-injury, dysphoric mood, decreased concentration and agitation.       History Past Medical History:  Diagnosis Date  . Asthma   . CAD (coronary artery disease)    Mild  . Diabetes mellitus   . HTN (hypertension)     . Hyperlipidemia   . OSA (obstructive sleep apnea) 08/09/2017    He has a past surgical history that includes Appendectomy; Tonsillectomy; Tumor removal; Cardiac catheterization; and Cardiovascular stress test.   His family history includes Arthritis in his mother; Cancer in his brother and father; Diabetes in his maternal aunt and mother; Hypertension in his maternal aunt, maternal aunt, and mother; Lung cancer in his unknown relative; Osteoporosis in his mother; Pneumonia in his mother.He reports that he quit smoking about 39 years ago. His smoking use included pipe. He quit after 7.00 years of use. He has never used smokeless tobacco. He reports that he drinks alcohol. He reports that he does not use drugs.  Current Outpatient Medications on File Prior to Visit  Medication Sig Dispense Refill  . albuterol (PROAIR HFA) 108 (90 BASE) MCG/ACT inhaler Inhale 2 puffs into the lungs every 6 (six) hours as needed for wheezing or shortness of breath. 1 Inhaler 4  . aspirin 81 MG tablet Take 81 mg by mouth daily.      . nitroGLYCERIN (NITROSTAT) 0.4 MG SL tablet Place 1 tablet (0.4 mg total) under the tongue every 5 (five) minutes as needed for chest pain. 25 tablet 5   No current facility-administered medications on file prior to visit.      Objective:  Objective  Physical Exam  Constitutional: He is oriented to person, place, and time. Vital signs are normal. He appears well-developed and well-nourished. He is sleeping.  HENT:  Head: Normocephalic and atraumatic.  Mouth/Throat: Oropharynx is clear and moist.  Eyes: Pupils are equal, round, and reactive to  light. EOM are normal.  Neck: Normal range of motion. Neck supple. No thyromegaly present.  Cardiovascular: Normal rate and regular rhythm.  No murmur heard. Pulmonary/Chest: Effort normal and breath sounds normal. No respiratory distress. He has no wheezes. He has no rales. He exhibits no tenderness.  Musculoskeletal: He exhibits no edema  or tenderness.  Neurological: He is alert and oriented to person, place, and time.  Skin: Skin is warm and dry.  Psychiatric: He has a normal mood and affect. His behavior is normal. Judgment and thought content normal.  Nursing note and vitals reviewed.  Diabetic Foot Exam - Simple   Simple Foot Form Diabetic Foot exam was performed with the following findings:  Yes 11/13/2017  8:52 AM  Visual Inspection No deformities, no ulcerations, no other skin breakdown bilaterally:  Yes Sensation Testing Pulse Check Posterior Tibialis and Dorsalis pulse intact bilaterally:  Yes Comments + venous statis dermatitis     BP (!) 127/55 (BP Location: Left Arm, Cuff Size: Large)   Pulse 65   Temp 98.5 F (36.9 C) (Oral)   Resp 16   Ht 5\' 10"  (1.778 m)   Wt (!) 316 lb (143.3 kg)   SpO2 100%   BMI 45.34 kg/m  Wt Readings from Last 3 Encounters:  11/13/17 (!) 316 lb (143.3 kg)  06/26/17 (!) 303 lb (137.4 kg)  05/08/17 (!) 316 lb 9.6 oz (143.6 kg)     Lab Results  Component Value Date   WBC 5.4 05/08/2017   HGB 12.7 (L) 05/08/2017   HCT 38.1 (L) 05/08/2017   PLT 228.0 05/08/2017   GLUCOSE 104 (H) 08/01/2017   CHOL 125 08/01/2017   TRIG 60.0 08/01/2017   HDL 60.00 08/01/2017   LDLDIRECT 198.6 03/20/2009   LDLCALC 53 08/01/2017   ALT 13 08/01/2017   AST 17 08/01/2017   NA 140 08/01/2017   K 4.4 08/01/2017   CL 104 08/01/2017   CREATININE 1.09 08/01/2017   BUN 23 08/01/2017   CO2 28 08/01/2017   TSH 1.28 11/07/2016   PSA 3.68 11/07/2016   INR 1.0 06/02/2008   HGBA1C 6.9 (H) 08/01/2017   MICROALBUR 1.2 11/06/2014    Dg Chest 2 View  Result Date: 11/06/2014 CLINICAL DATA:  Cough, wheezing for hours EXAM: CHEST  2 VIEW COMPARISON:  06/02/2008 FINDINGS: The heart size and mediastinal contours are within normal limits. Both lungs are clear. The visualized skeletal structures are unremarkable. IMPRESSION: No active cardiopulmonary disease. Electronically Signed   By: Elige KoHetal  Patel    On: 11/06/2014 15:11     Assessment & Plan:  Plan  I have discontinued Celesta GentileStephen P. Brensinger's beclomethasone. I have also changed his pioglitazone and glimepiride. Additionally, I am having him maintain his aspirin, nitroGLYCERIN, albuterol, simvastatin, metFORMIN, lisinopril, carvedilol, and fenofibrate.  Meds ordered this encounter  Medications  . simvastatin (ZOCOR) 40 MG tablet    Sig: Take 1 tablet (40 mg total) by mouth at bedtime.    Dispense:  90 tablet    Refill:  1  . pioglitazone (ACTOS) 45 MG tablet    Sig: Take 1 tablet (45 mg total) by mouth daily.    Dispense:  90 tablet    Refill:  1  . metFORMIN (GLUCOPHAGE) 1000 MG tablet    Sig: Take 1 tablet (1,000 mg total) by mouth 2 (two) times daily with a meal.    Dispense:  180 tablet    Refill:  1  . lisinopril (PRINIVIL,ZESTRIL) 10 MG tablet  Sig: Take 1 tablet (10 mg total) by mouth daily.    Dispense:  90 tablet    Refill:  1  . glimepiride (AMARYL) 4 MG tablet    Sig: Take 1 tablet (4 mg total) by mouth daily with breakfast.    Dispense:  90 tablet    Refill:  1  . carvedilol (COREG) 6.25 MG tablet    Sig: Take 1 tablet (6.25 mg total) by mouth 2 (two) times daily with a meal.    Dispense:  180 tablet    Refill:  1  . fenofibrate 160 MG tablet    Sig: Take 1 tablet (160 mg total) by mouth daily.    Dispense:  90 tablet    Refill:  1    Problem List Items Addressed This Visit      Unprioritized   Diabetes mellitus type II, uncontrolled (HCC)    hgba1c to be checked , minimize simple carbs. Increase exercise as tolerated. Continue current meds       Relevant Medications   simvastatin (ZOCOR) 40 MG tablet   pioglitazone (ACTOS) 45 MG tablet   metFORMIN (GLUCOPHAGE) 1000 MG tablet   lisinopril (PRINIVIL,ZESTRIL) 10 MG tablet   glimepiride (AMARYL) 4 MG tablet   Hyperlipidemia - Primary    Tolerating statin, encouraged heart healthy diet, avoid trans fats, minimize simple carbs and saturated fats. Increase  exercise as tolerated      Relevant Medications   simvastatin (ZOCOR) 40 MG tablet   lisinopril (PRINIVIL,ZESTRIL) 10 MG tablet   carvedilol (COREG) 6.25 MG tablet   fenofibrate 160 MG tablet   Other Relevant Orders   Comprehensive metabolic panel   Lipid panel   Amb Ref to Medical Weight Management   Hypertension    Well controlled, no changes to meds. Encouraged heart healthy diet such as the DASH diet and exercise as tolerated.       Relevant Medications   simvastatin (ZOCOR) 40 MG tablet   lisinopril (PRINIVIL,ZESTRIL) 10 MG tablet   carvedilol (COREG) 6.25 MG tablet   fenofibrate 160 MG tablet   Other Relevant Orders   Comprehensive metabolic panel   Lipid panel   Hemoglobin A1c   Amb Ref to Medical Weight Management   Preventative health care    Pt due for colonoscopy      Relevant Orders   Ambulatory referral to Gastroenterology    Other Visit Diagnoses    DM (diabetes mellitus) type II uncontrolled, periph vascular disorder (HCC)       Relevant Medications   simvastatin (ZOCOR) 40 MG tablet   pioglitazone (ACTOS) 45 MG tablet   metFORMIN (GLUCOPHAGE) 1000 MG tablet   lisinopril (PRINIVIL,ZESTRIL) 10 MG tablet   glimepiride (AMARYL) 4 MG tablet   carvedilol (COREG) 6.25 MG tablet   fenofibrate 160 MG tablet   Other Relevant Orders   Comprehensive metabolic panel   Lipid panel   Hemoglobin A1c   Amb Ref to Medical Weight Management   Hyperlipidemia LDL goal <70       Relevant Medications   simvastatin (ZOCOR) 40 MG tablet   lisinopril (PRINIVIL,ZESTRIL) 10 MG tablet   carvedilol (COREG) 6.25 MG tablet   fenofibrate 160 MG tablet   Morbid obesity (HCC)       Relevant Medications   pioglitazone (ACTOS) 45 MG tablet   metFORMIN (GLUCOPHAGE) 1000 MG tablet   glimepiride (AMARYL) 4 MG tablet   Other Relevant Orders   Amb Ref to Medical Weight Management  Follow-up: Return in about 6 months (around 05/14/2018) for hypertension, hyperlipidemia,  diabetes II.  Donato Schultz, DO

## 2018-01-08 ENCOUNTER — Encounter: Payer: Self-pay | Admitting: Family Medicine

## 2018-01-11 ENCOUNTER — Encounter (INDEPENDENT_AMBULATORY_CARE_PROVIDER_SITE_OTHER): Payer: Self-pay

## 2018-01-24 ENCOUNTER — Ambulatory Visit (INDEPENDENT_AMBULATORY_CARE_PROVIDER_SITE_OTHER): Payer: Medicare Other | Admitting: Family Medicine

## 2018-01-24 ENCOUNTER — Encounter (INDEPENDENT_AMBULATORY_CARE_PROVIDER_SITE_OTHER): Payer: Self-pay | Admitting: Family Medicine

## 2018-01-24 VITALS — BP 120/56 | HR 67 | Temp 98.0°F | Ht 68.0 in | Wt 301.0 lb

## 2018-01-24 DIAGNOSIS — R251 Tremor, unspecified: Secondary | ICD-10-CM

## 2018-01-24 DIAGNOSIS — R5383 Other fatigue: Secondary | ICD-10-CM | POA: Diagnosis not present

## 2018-01-24 DIAGNOSIS — Z1331 Encounter for screening for depression: Secondary | ICD-10-CM | POA: Diagnosis not present

## 2018-01-24 DIAGNOSIS — I10 Essential (primary) hypertension: Secondary | ICD-10-CM

## 2018-01-24 DIAGNOSIS — E1165 Type 2 diabetes mellitus with hyperglycemia: Secondary | ICD-10-CM

## 2018-01-24 DIAGNOSIS — Z6841 Body Mass Index (BMI) 40.0 and over, adult: Secondary | ICD-10-CM

## 2018-01-24 DIAGNOSIS — R0602 Shortness of breath: Secondary | ICD-10-CM | POA: Diagnosis not present

## 2018-01-24 DIAGNOSIS — E559 Vitamin D deficiency, unspecified: Secondary | ICD-10-CM

## 2018-01-24 DIAGNOSIS — Z0289 Encounter for other administrative examinations: Secondary | ICD-10-CM

## 2018-01-24 MED ORDER — GLUCOSE BLOOD VI STRP: ORAL_STRIP | 0 refills | Status: DC

## 2018-01-25 LAB — COMPREHENSIVE METABOLIC PANEL
ALT: 13 IU/L (ref 0–44)
AST: 15 IU/L (ref 0–40)
Albumin/Globulin Ratio: 1.9 (ref 1.2–2.2)
Albumin: 4.4 g/dL (ref 3.6–4.8)
Alkaline Phosphatase: 48 IU/L (ref 39–117)
BUN/Creatinine Ratio: 18 (ref 10–24)
BUN: 13 mg/dL (ref 8–27)
Bilirubin Total: 0.4 mg/dL (ref 0.0–1.2)
CO2: 23 mmol/L (ref 20–29)
Calcium: 9.3 mg/dL (ref 8.6–10.2)
Chloride: 102 mmol/L (ref 96–106)
Creatinine, Ser: 0.74 mg/dL — ABNORMAL LOW (ref 0.76–1.27)
GFR calc Af Amer: 110 mL/min/{1.73_m2} (ref >59)
GFR calc non Af Amer: 95 mL/min/{1.73_m2} (ref >59)
Globulin, Total: 2.3 g/dL (ref 1.5–4.5)
Glucose: 108 mg/dL — ABNORMAL HIGH (ref 65–99)
Potassium: 4.1 mmol/L (ref 3.5–5.2)
Sodium: 139 mmol/L (ref 134–144)
Total Protein: 6.7 g/dL (ref 6.0–8.5)

## 2018-01-25 LAB — CBC WITH DIFFERENTIAL/PLATELET
Basophils Absolute: 0.1 10*3/uL (ref 0.0–0.2)
Basos: 1 %
EOS (ABSOLUTE): 0.4 10*3/uL (ref 0.0–0.4)
Eos: 5 %
Hematocrit: 38.1 % (ref 37.5–51.0)
Hemoglobin: 13 g/dL (ref 13.0–17.7)
Immature Grans (Abs): 0 10*3/uL (ref 0.0–0.1)
Immature Granulocytes: 0 %
Lymphocytes Absolute: 1.2 10*3/uL (ref 0.7–3.1)
Lymphs: 16 %
MCH: 29.2 pg (ref 26.6–33.0)
MCHC: 34.1 g/dL (ref 31.5–35.7)
MCV: 86 fL (ref 79–97)
Monocytes Absolute: 0.7 10*3/uL (ref 0.1–0.9)
Monocytes: 9 %
Neutrophils Absolute: 5 10*3/uL (ref 1.4–7.0)
Neutrophils: 69 %
Platelets: 265 10*3/uL (ref 150–450)
RBC: 4.45 x10E6/uL (ref 4.14–5.80)
RDW: 12.8 % (ref 12.3–15.4)
WBC: 7.4 10*3/uL (ref 3.4–10.8)

## 2018-01-25 LAB — LIPID PANEL
Chol/HDL Ratio: 3.9 ratio (ref 0.0–5.0)
Cholesterol, Total: 217 mg/dL — ABNORMAL HIGH (ref 100–199)
HDL: 56 mg/dL (ref >39)
LDL Calculated: 135 mg/dL — ABNORMAL HIGH (ref 0–99)
Triglycerides: 128 mg/dL (ref 0–149)
VLDL Cholesterol Cal: 26 mg/dL (ref 5–40)

## 2018-01-25 LAB — VITAMIN D 25 HYDROXY (VIT D DEFICIENCY, FRACTURES): Vit D, 25-Hydroxy: 26.5 ng/mL — ABNORMAL LOW (ref 30.0–100.0)

## 2018-01-25 LAB — TSH: TSH: 1.05 u[IU]/mL (ref 0.450–4.500)

## 2018-01-25 LAB — T3: T3, Total: 95 ng/dL (ref 71–180)

## 2018-01-25 LAB — INSULIN, RANDOM: INSULIN: 7.9 u[IU]/mL (ref 2.6–24.9)

## 2018-01-25 LAB — T4, FREE: Free T4: 1.7 ng/dL (ref 0.82–1.77)

## 2018-01-30 ENCOUNTER — Ambulatory Visit (INDEPENDENT_AMBULATORY_CARE_PROVIDER_SITE_OTHER): Payer: Medicare Other | Admitting: Family Medicine

## 2018-01-30 ENCOUNTER — Other Ambulatory Visit: Payer: Self-pay | Admitting: Family Medicine

## 2018-01-30 VITALS — BP 118/70 | HR 65 | Temp 98.0°F | Ht 68.0 in | Wt 298.0 lb

## 2018-01-30 DIAGNOSIS — Z9189 Other specified personal risk factors, not elsewhere classified: Secondary | ICD-10-CM

## 2018-01-30 DIAGNOSIS — E559 Vitamin D deficiency, unspecified: Secondary | ICD-10-CM

## 2018-01-30 DIAGNOSIS — IMO0002 Reserved for concepts with insufficient information to code with codable children: Secondary | ICD-10-CM

## 2018-01-30 DIAGNOSIS — E1165 Type 2 diabetes mellitus with hyperglycemia: Principal | ICD-10-CM

## 2018-01-30 DIAGNOSIS — E7849 Other hyperlipidemia: Secondary | ICD-10-CM

## 2018-01-30 DIAGNOSIS — Z6841 Body Mass Index (BMI) 40.0 and over, adult: Secondary | ICD-10-CM | POA: Diagnosis not present

## 2018-01-30 DIAGNOSIS — G250 Essential tremor: Secondary | ICD-10-CM

## 2018-01-30 DIAGNOSIS — E1151 Type 2 diabetes mellitus with diabetic peripheral angiopathy without gangrene: Secondary | ICD-10-CM

## 2018-01-30 MED ORDER — VITAMIN D (ERGOCALCIFEROL) 1.25 MG (50000 UNIT) PO CAPS: 50000.0000 [IU] | ORAL_CAPSULE | ORAL | 0 refills | Status: DC

## 2018-01-30 MED ORDER — PIOGLITAZONE HCL 45 MG PO TABS: 45.0000 mg | ORAL_TABLET | Freq: Every day | ORAL | 0 refills | Status: DC

## 2018-01-30 MED ORDER — GLIMEPIRIDE 4 MG PO TABS: 4.0000 mg | ORAL_TABLET | Freq: Every day | ORAL | 0 refills | Status: DC

## 2018-01-31 NOTE — Progress Notes (Signed)
Office: [phone]  /  Fax: [phone]   Dear Dr. Zola Button,   Thank you for referring Zachary Michael to our clinic. The following note includes my evaluation and treatment recommendations.  HPI:   Chief Complaint: OBESITY    BERTEL VENARD has been referred by Donato Schultz, DO for consultation regarding his obesity and obesity related comorbidities.    Zachary Michael (MR# [id number]) is a 67 y.o. male who presents on 01/31/2018 for obesity evaluation and treatment. Current BMI is Body mass index is 45.77 kg/m.Zachary Michael has been struggling with his weight for many years and has been unsuccessful in either losing weight, maintaining weight loss, or reaching his healthy weight goal.     Zachary Michael attended our information session and states he is currently in the action stage of change and ready to dedicate time achieving and maintaining a healthier weight. Zachary Michael is interested in becoming our patient and working on intensive lifestyle modifications including (but not limited to) diet, exercise and weight loss.    Zachary Michael states his family eats meals together he thinks his family will eat healthier with  him his desired weight loss is 51 lbs he has been heavy most of  his life his heaviest weight ever was 309 lbs. he has significant food cravings issues  he skips meals frequently he is frequently drinking liquids with calories he frequently makes poor food choices he frequently eats larger portions than normal    Fatigue [name] feels his energy is lower than it should be. This has worsened with weight gain and has not worsened recently. [name] admits to daytime somnolence and he denies waking up still tired. Patient is at risk for obstructive sleep apnea. Patent has a history of symptoms of daytime fatigue and hypertension. Patient generally gets 4 to 6 hours of sleep per night, and states they generally have restful sleep. Snoring is present. Apneic episodes are present.  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  Dyspnea on exertion Zachary Michael notes increasing shortness of breath with exercising and seems to be worsening over time with weight gain. He notes getting out of breath sooner with activity than he used to. This has not gotten worse recently. Zachary Michael denies orthopnea. 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  Diabetes II with hyperglycemia not on insulin Zachary Michael has a diagnosis of diabetes type II. Zachary Michael states fasting BGs range between 100 and 150 and he denies any hypoglycemic episodes. Last A1c was at 6.9 on 11/13/17.  He has been working on intensive lifestyle modifications including diet, exercise, and weight loss to help control his blood glucose levels.  Hypertension ZACHARI Michael is a 67 y.o. male with hypertension. Zachary Michael sees cardiology. Zachary Michael denies chest pain. He is working weight loss to help control his blood pressure with the goal of decreasing his risk of heart attack and stroke. Zachary Michael blood pressure is controlled today.  Tremor Zachary Michael has a diagnosis of tremor; resting and with intention.  Vitamin D deficiency Zachary Michael has a diagnosis of vitamin D deficiency. He is currently taking OTC supplement. Zachary Michael admits fatigue and denies nausea, vomiting or muscle weakness.  Depression Screen Zachary Michael's Food and Mood (modified PHQ-9) score was  Depression screen PHQ 2/9 01/24/2018  Decreased Interest 3  Down, Depressed, Hopeless 1  PHQ - 2 Score 4  Altered sleeping 0  Tired, decreased  energy 3  Change in appetite 0  Feeling bad or failure about yourself  0  Trouble concentrating 0  Moving slowly or fidgety/restless 3  Suicidal thoughts 0  PHQ-9 Score 10  Difficult doing work/chores Not difficult at all    ALLERGIES: No Known Allergies  MEDICATIONS: Current Outpatient Medications  on File Prior to Visit  Medication Sig Dispense Refill  . albuterol (PROAIR HFA) 108 (90 BASE) MCG/ACT inhaler Inhale 2 puffs into the lungs every 6 (six) hours as needed for wheezing or shortness of breath. 1 Inhaler 4  . b complex vitamins tablet Take 1 tablet by mouth daily.    . calcium carbonate (CALCIUM 600) 600 MG TABS tablet Take 600 mg by mouth daily with breakfast.    . carvedilol (COREG) 6.25 MG tablet Take 1 tablet (6.25 mg total) by mouth 2 (two) times daily with a meal. 180 tablet 1  . Cinnamon 500 MG capsule Take 500 mg by mouth daily.    Zachary Kitchen co-enzyme Q-10 30 MG capsule Take 30 mg by mouth 3 (three) times daily.    . fenofibrate 160 MG tablet Take 1 tablet (160 mg total) by mouth daily. 90 tablet 1  . ferrous sulfate 325 (65 FE) MG EC tablet Take 325 mg by mouth 3 (three) times daily with meals.    Zachary Kitchen lisinopril (PRINIVIL,ZESTRIL) 10 MG tablet Take 1 tablet (10 mg total) by mouth daily. 90 tablet 1  . metFORMIN (GLUCOPHAGE) 1000 MG tablet Take 1 tablet (1,000 mg total) by mouth 2 (two) times daily with a meal. 180 tablet 1  . Multiple Vitamin (MULTIVITAMIN) capsule Take 1 capsule by mouth daily.    . nitroGLYCERIN (NITROSTAT) 0.4 MG SL tablet Place 1 tablet (0.4 mg total) under the tongue every 5 (five) minutes as needed for chest pain. 25 tablet 5  . simvastatin (ZOCOR) 40 MG tablet Take 1 tablet (40 mg total) by mouth at bedtime. 90 tablet 1  . Zinc 100 MG TABS Take 1 tablet by mouth daily.    Zachary Kitchen aspirin 81 MG tablet Take 81 mg by mouth daily.       No current facility-administered medications on file prior to visit.     PAST MEDICAL HISTORY: Past Medical History:  Diagnosis Date  . Asthma   . Back pain   . CAD (coronary artery disease)    Mild  . Diabetes mellitus   . Heart valve problem   . HTN (hypertension)   . Hyperlipidemia   . Joint pain   . OSA (obstructive sleep apnea) 08/09/2017  . Palpitations   . SOB (shortness of breath)   . Subluxation    L2-3-4     PAST SURGICAL HISTORY: Past Surgical History:  Procedure Laterality Date  . APPENDECTOMY    . CARDIAC CATHETERIZATION     We recently performed a which revealed minor coronary artery irregularities. He had normal left ventricle systolic function with an EF of 60%  . CARDIOVASCULAR STRESS TEST     EF 63%  . TONSILLECTOMY    . TUMOR REMOVAL     LEFT ARM    SOCIAL HISTORY: Social History   Tobacco Use  . Smoking status: Former Smoker    Years: 7.00    Types: Pipe    Last attempt to quit: 1980    Years since quitting: 39.9  . Smokeless tobacco: Never Used  Substance Use Topics  . Alcohol use: Yes    Comment: occassional  . Drug use: No  FAMILY HISTORY: Family History  Problem Relation Age of Onset  . Pneumonia Mother   . Osteoporosis Mother   . Arthritis Mother   . Diabetes Mother   . Hypertension Mother   . Cancer Father        leukemia, lung  . Cancer Brother   . Diabetes Maternal Aunt   . Hypertension Maternal Aunt   . Hypertension Maternal Aunt   . Lung cancer Unknown     ROS: Review of Systems  Constitutional: Positive for malaise/fatigue.  HENT: Positive for tinnitus.   Eyes:       + Floaters + Wear Glasses  Respiratory: Positive for shortness of breath (with activity).   Cardiovascular: Positive for palpitations. Negative for chest pain and orthopnea.  Gastrointestinal: Negative for nausea and vomiting.  Musculoskeletal: Positive for back pain.       Negative for muscle weakness  Skin: Positive for itching.  Neurological: Positive for tremors and weakness.  Endo/Heme/Allergies:       Negative for hypoglycemia  Psychiatric/Behavioral: The patient has insomnia.     PHYSICAL EXAM: Blood pressure (!) 120/56, pulse 67, temperature 98 F (36.7 C), temperature source Oral, height 5\' 8"  (1.727 m), weight (!) 301 lb (136.5 kg), SpO2 96 %. Body mass index is 45.77 kg/m. Physical Exam  Constitutional: He is oriented to person, place, and time. He  appears well-developed and well-nourished.  HENT:  Head: Normocephalic and atraumatic.  Nose: Nose normal.  Eyes: EOM are normal.  Neck: Normal range of motion. Neck supple. No thyromegaly present.  Cardiovascular: Normal rate and regular rhythm.  Pulmonary/Chest: Effort normal. No respiratory distress.  Abdominal: Soft. There is no tenderness.  + Obesity  Musculoskeletal: Normal range of motion.  Range of Motion normal in all 4 extremities  Neurological: He is alert and oriented to person, place, and time.  Skin: Skin is warm and dry.  Psychiatric: He has a normal mood and affect. His behavior is normal.  Vitals reviewed.   RECENT LABS AND TESTS: BMET    Component Value Date/Time   NA 139 01/24/2018 1307   K 4.1 01/24/2018 1307   CL 102 01/24/2018 1307   CO2 23 01/24/2018 1307   GLUCOSE 108 (H) 01/24/2018 1307   GLUCOSE 117 (H) 11/13/2017 0938   BUN 13 01/24/2018 1307   CREATININE 0.74 (L) 01/24/2018 1307   CALCIUM 9.3 01/24/2018 1307   GFRNONAA 95 01/24/2018 1307   GFRAA 110 01/24/2018 1307   Lab Results  Component Value Date   HGBA1C 6.9 (H) 11/13/2017   Lab Results  Component Value Date   INSULIN 7.9 01/24/2018   CBC    Component Value Date/Time   WBC 7.4 01/24/2018 1307   WBC 5.4 05/08/2017 1032   RBC 4.45 01/24/2018 1307   RBC 4.31 05/08/2017 1032   HGB 13.0 01/24/2018 1307   HCT 38.1 01/24/2018 1307   PLT 265 01/24/2018 1307   MCV 86 01/24/2018 1307   MCH 29.2 01/24/2018 1307   MCHC 34.1 01/24/2018 1307   MCHC 33.3 05/08/2017 1032   RDW 12.8 01/24/2018 1307   LYMPHSABS 1.2 01/24/2018 1307   MONOABS 0.5 05/08/2017 1032   EOSABS 0.4 01/24/2018 1307   BASOSABS 0.1 01/24/2018 1307   Iron/TIBC/Ferritin/ %Sat    Component Value Date/Time   IRON 102 05/08/2017 1032   IRON 102 05/08/2017 1032   FERRITIN 64.0 05/08/2017 1032   IRONPCTSAT 19.9 (L) 05/08/2017 1032   Lipid Panel     Component Value Date/Time  CHOL 217 (H) 01/24/2018 1307   TRIG  128 01/24/2018 1307   HDL 56 01/24/2018 1307   CHOLHDL 3.9 01/24/2018 1307   CHOLHDL 2 11/13/2017 0938   VLDL 11.4 11/13/2017 0938   LDLCALC 135 (H) 01/24/2018 1307   LDLDIRECT 198.6 03/20/2009 1127   Hepatic Function Panel     Component Value Date/Time   PROT 6.7 01/24/2018 1307   ALBUMIN 4.4 01/24/2018 1307   AST 15 01/24/2018 1307   ALT 13 01/24/2018 1307   ALKPHOS 48 01/24/2018 1307   BILITOT 0.4 01/24/2018 1307   BILIDIR 0.1 08/14/2014 0824      Component Value Date/Time   TSH 1.050 01/24/2018 1307   TSH 1.28 11/07/2016 1008   TSH 0.98 09/27/2011 0945    ECG  shows NSR with a rate of 73 BPM INDIRECT CALORIMETER done today shows a VO2 of 332 and a REE of 2308.  His calculated basal metabolic rate is 1610 thus his basal metabolic rate is worse than expected.    ASSESSMENT AND PLAN: Other fatigue - Plan: EKG 12-Lead, Comprehensive metabolic panel, CBC with Differential/Platelet, T3, T4, free, TSH  Shortness of breath on exertion  Controlled type 2 diabetes mellitus with hyperglycemia, without long-term current use of insulin (HCC) - Plan: Insulin, random, glucose blood (ONETOUCH VERIO) test strip  Essential hypertension - Plan: Lipid panel  Tremor - Plan: Ambulatory referral to Neurology  Vitamin D deficiency - Plan: VITAMIN D 25 Hydroxy (Vit-D Deficiency, Fractures)  Class 3 severe obesity with serious comorbidity and body mass index (BMI) of 45.0 to 49.9 in adult, unspecified obesity type (HCC)  PLAN: Fatigue Lynden was informed that his fatigue may be related to obesity, depression or many other causes. Labs will be ordered, and in the meanwhile Helmer has agreed to work on diet, exercise and weight loss to help with fatigue. Proper sleep hygiene was discussed including the need for 7-8 hours of quality sleep each night. A sleep study was not ordered based on symptoms and Epworth score. We will order EKG and indirect calorimetry today.  Dyspnea on  exertion Sylvio's shortness of breath appears to be obesity related and exercise induced. He has agreed to work on weight loss and gradually increase exercise to treat his exercise induced shortness of breath. If Tysean follows our instructions and loses weight without improvement of his shortness of breath, we will plan to refer to pulmonology. We will order EKG, indirect calorimetry and labs today. We will monitor this condition regularly. Darragh agrees to this plan.  Diabetes II with hyperglycemia not on insulin Matthew has been given extensive diabetes education by myself today including ideal fasting and post-prandial blood glucose readings, individual ideal Hgb A1c goals and hypoglycemia prevention. Hypoglycemia protocol was discussed today. We discussed the importance of good blood sugar control to decrease the likelihood of diabetic complications such as nephropathy, neuropathy, limb loss, blindness, coronary artery disease, and death. We discussed the importance of intensive lifestyle modification including diet, exercise and weight loss as the first line treatment for diabetes. We will check insulin level today. Nochum agrees to continue his diabetes medications and will follow up at the agreed upon time.  Hypertension We discussed sodium restriction, working on healthy weight loss, and a regular exercise program as the means to achieve improved blood pressure control. Dossie agreed with this plan and agreed to follow up as directed. We will continue to monitor his blood pressure as well as his progress with the above lifestyle modifications. He  will continue his medications as prescribed and will watch for signs of hypotension as he continues his lifestyle modifications. We will order FLP, CMP and EKG today.  Tremor We will refer to neurology and [name] SeniorStephen agrees to follow up with our clinic in 2 weeks.  Vitamin D Deficiency [name] SeniorStephen was informed that low vitamin D levels contributes to  fatigue and are associated with obesity, breast, and colon cancer. He will continue to take OTC supplement and will follow up for routine testing of vitamin D, at least 2-3 times per year. He was informed of the risk of over-replacement of vitamin D and agrees to not increase his dose unless he discusses this with us first. We will check vitamin D level today and [name] SeniorStephen agrees to follow up as directed.  Depression Screen [name] SeniorStephen had a moderately positive depression screening. Depression is commonly associated with obesity and often results in emotional eating behaviors. We will monitor this closely and work on CBT to help improve the non-hunger eating patterns. Referral to Psychology may be required if no improvement is seen as he continues in our clinic.  Obesity [name] SeniorStephen is currently in the action stage of change and his goal is to continue with weight loss efforts. I recommend [name] SeniorStephen begin the structured treatment plan as follows:  He has agreed to follow the Category 3 plan [name] SeniorStephen has been instructed to eventually work up to a goal of 150 minutes of combined cardio and strengthening exercise per week for weight loss and overall health benefits. We discussed the following Behavioral Modification Strategies today: planning for success, increasing lean protein intake, increasing vegetables, work on meal planning and easy cooking plans and holiday eating strategies    He was informed of the importance of frequent follow up visits to maximize his success with intensive lifestyle modifications for his multiple health conditions. He was informed we would discuss his lab results at his next visit unless there is a critical issue that needs to be addressed sooner. [name] SeniorStephen agreed to keep his next visit at the agreed upon time to discuss these results.    OBESITY BEHAVIORAL INTERVENTION VISIT  Today's visit was # 1   Starting weight: 301 lbs Starting date: 01/24/2018 Today's weight : 301  lbs Today's date: 01/24/2018 Total lbs lost to date: 0 At least 15 minutes were spent on discussing the following behavioral intervention visit.   ASK: We discussed the diagnosis of obesity with Zachary DroneStephen P Red today and [name] SeniorStephen agreed to give us permission to discuss obesity behavioral modification therapy today.  ASSESS: [name] SeniorStephen has the diagnosis of obesity and his BMI today is 45.78 [name] SeniorStephen is in the action stage of change   ADVISE: [name] SeniorStephen was educated on the multiple health risks of obesity as well as the benefit of weight loss to improve his health. He was advised of the need for long term treatment and the importance of lifestyle modifications to improve his current health and to decrease his risk of future health problems.  AGREE: Multiple dietary modification options and treatment options were discussed and  [name] SeniorStephen agreed to follow the recommendations documented in the above note.  ARRANGE: [name] SeniorStephen was educated on the importance of frequent visits to treat obesity as outlined per CMS and USPSTF guidelines and agreed to schedule his next follow up appointment today.  I, [place] CraneJoanne Murray, am acting as transcriptionist for Filbert SchilderAlexandria U. Kadolph, MD   I have reviewed the above documentation for accuracy and completeness, and I agree with the above. Mackie Pai- Saide Lanuza  Rinaldo Ratel, MD

## 2018-02-01 ENCOUNTER — Ambulatory Visit: Payer: Medicare Other | Admitting: Neurology

## 2018-02-01 ENCOUNTER — Telehealth: Payer: Self-pay

## 2018-02-01 NOTE — Telephone Encounter (Signed)
Due to unforseen technical difficulties, our office is closed for the afternoon. I have called pt twice to r/s his appt and left messages twice for him to call us back to r/s his appt. I will call the pt again when our office is back up and running to r/s his appt from today.

## 2018-02-05 ENCOUNTER — Other Ambulatory Visit: Payer: Self-pay | Admitting: Family Medicine

## 2018-02-05 DIAGNOSIS — E1165 Type 2 diabetes mellitus with hyperglycemia: Principal | ICD-10-CM

## 2018-02-05 DIAGNOSIS — E1151 Type 2 diabetes mellitus with diabetic peripheral angiopathy without gangrene: Secondary | ICD-10-CM

## 2018-02-05 DIAGNOSIS — IMO0002 Reserved for concepts with insufficient information to code with codable children: Secondary | ICD-10-CM

## 2018-02-05 NOTE — Progress Notes (Signed)
Office: [phone]  /  Fax: [phone]   HPI:   Chief Complaint: OBESITY Zachary Michael is here to discuss his progress with his obesity treatment plan. He is on the Category 3 plan and is following his eating plan approximately 75 % of the time. He states he is walking 1 mile daily. Zachary Michael has made a few indulgences with Thanksgiving and with leftovers. He denies it being very difficult to adhere to the meal plan. He is eating mostly frozen fruit and he is struggling to eat all the meat at dinner (getting in approximately 8 oz). He notes hunger at odd times.  His weight is 298 lb (135.2 kg) today and has had a weight loss of 3 pounds over a period of 1 weeks since his last visit. He has lost 3 lbs since starting treatment with Zachary Michael.  Diabetes II Zachary Michael has a diagnosis of diabetes type II. Zachary Michael's BGs are elevated but he ran out of his medications, and so having fasting approximately 105 when on medications and on the plan. Last A1c was 6.9. He has been working on intensive lifestyle modifications including diet, exercise, and weight loss to help control his blood glucose levels.  Vitamin D Deficiency Zachary Michael has a diagnosis of vitamin D deficiency. He is not on OTC Vit D. He note fatigue and denies nausea, vomiting or muscle weakness.  Hyperlipidemia Zachary Michael has hyperlipidemia and has been trying to improve his cholesterol levels with intensive lifestyle modification including a low saturated fat diet, exercise and weight loss. His LDL is elevated (significantly from 3 months ago). He is on statin and denies any chest pain, claudication or myalgias.  Essential Tremor Zachary Michael has essential tremor, and he is seeing Dr. Frances Furbish in 2 days.  ALLERGIES: No Known Allergies  MEDICATIONS: Current Outpatient Medications on File Prior to Visit  Medication Sig Dispense Refill  . albuterol (PROAIR HFA) 108 (90 BASE) MCG/ACT inhaler Inhale 2 puffs into the lungs every 6 (six) hours as needed for  wheezing or shortness of breath. 1 Inhaler 4  . aspirin 81 MG tablet Take 81 mg by mouth daily.      [name] b complex vitamins tablet Take 1 tablet by mouth daily.    . calcium carbonate (CALCIUM 600) 600 MG TABS tablet Take 600 mg by mouth daily with breakfast.    . carvedilol (COREG) 6.25 MG tablet Take 1 tablet (6.25 mg total) by mouth 2 (two) times daily with a meal. 180 tablet 1  . Cinnamon 500 MG capsule Take 500 mg by mouth daily.    [name] co-enzyme Q-10 30 MG capsule Take 30 mg by mouth 3 (three) times daily.    . fenofibrate 160 MG tablet Take 1 tablet (160 mg total) by mouth daily. 90 tablet 1  . ferrous sulfate 325 (65 FE) MG EC tablet Take 325 mg by mouth 3 (three) times daily with meals.    [name] glucose blood (ONETOUCH VERIO) test strip Use as instructed 100 each 0  . lisinopril (PRINIVIL,ZESTRIL) 10 MG tablet Take 1 tablet (10 mg total) by mouth daily. 90 tablet 1  . metFORMIN (GLUCOPHAGE) 1000 MG tablet Take 1 tablet (1,000 mg total) by mouth 2 (two) times daily with a meal. 180 tablet 1  . Multiple Vitamin (MULTIVITAMIN) capsule Take 1 capsule by mouth daily.    . nitroGLYCERIN (NITROSTAT) 0.4 MG SL tablet Place 1 tablet (0.4 mg total) under the tongue every 5 (five) minutes as needed for chest pain. 25 tablet 5  .  simvastatin (ZOCOR) 40 MG tablet Take 1 tablet (40 mg total) by mouth at bedtime. 90 tablet 1  . Zinc 100 MG TABS Take 1 tablet by mouth daily.     No current facility-administered medications on file prior to visit.     PAST MEDICAL HISTORY: Past Medical History:  Diagnosis Date  . Asthma   . Back pain   . CAD (coronary artery disease)    Mild  . Diabetes mellitus   . Heart valve problem   . HTN (hypertension)   . Hyperlipidemia   . Joint pain   . OSA (obstructive sleep apnea) 08/09/2017  . Palpitations   . SOB (shortness of breath)   . Subluxation    L2-3-4    PAST SURGICAL HISTORY: Past Surgical History:  Procedure Laterality Date  . APPENDECTOMY    .  CARDIAC CATHETERIZATION     We recently performed a which revealed minor coronary artery irregularities. He had normal left ventricle systolic function with an EF of 60%  . CARDIOVASCULAR STRESS TEST     EF 63%  . TONSILLECTOMY    . TUMOR REMOVAL     LEFT ARM    SOCIAL HISTORY: Social History   Tobacco Use  . Smoking status: Former Smoker    Years: 7.00    Types: Pipe    Last attempt to quit: 1980    Years since quitting: 39.9  . Smokeless tobacco: Never Used  Substance Use Topics  . Alcohol use: Yes    Comment: occassional  . Drug use: No    FAMILY HISTORY: Family History  Problem Relation Age of Onset  . Pneumonia Mother   . Osteoporosis Mother   . Arthritis Mother   . Diabetes Mother   . Hypertension Mother   . Cancer Father        leukemia, lung  . Cancer Brother   . Diabetes Maternal Aunt   . Hypertension Maternal Aunt   . Hypertension Maternal Aunt   . Lung cancer Unknown     ROS: Review of Systems  Constitutional: Positive for malaise/fatigue and weight loss.  Cardiovascular: Negative for chest pain and claudication.  Gastrointestinal: Negative for nausea and vomiting.  Musculoskeletal: Negative for myalgias.       Negative muscle weakness  Neurological: Positive for tremors.  Endo/Heme/Allergies:       Negative hypoglycemia    PHYSICAL EXAM: Blood pressure 118/70, pulse 65, temperature 98 F (36.7 C), temperature source Oral, height 5\' 8"  (1.727 m), weight 298 lb (135.2 kg), SpO2 95 %. Body mass index is 45.31 kg/m. Physical Exam  Constitutional: He is oriented to person, place, and time. He appears well-developed and well-nourished.  Cardiovascular: Normal rate.  Pulmonary/Chest: Effort normal.  Musculoskeletal: Normal range of motion.  Neurological: He is oriented to person, place, and time.  Skin: Skin is warm and dry.  Psychiatric: He has a normal mood and affect. His behavior is normal.  Vitals reviewed.   RECENT LABS AND  TESTS: BMET    Component Value Date/Time   NA 139 01/24/2018 1307   K 4.1 01/24/2018 1307   CL 102 01/24/2018 1307   CO2 23 01/24/2018 1307   GLUCOSE 108 (H) 01/24/2018 1307   GLUCOSE 117 (H) 11/13/2017 0938   BUN 13 01/24/2018 1307   CREATININE 0.74 (L) 01/24/2018 1307   CALCIUM 9.3 01/24/2018 1307   GFRNONAA 95 01/24/2018 1307   GFRAA 110 01/24/2018 1307   Lab Results  Component Value Date  HGBA1C 6.9 (H) 11/13/2017   HGBA1C 6.9 (H) 08/01/2017   HGBA1C 7.5 (H) 05/08/2017   HGBA1C 6.7 (H) 11/07/2016   HGBA1C 7.2 (H) 08/17/2016   Lab Results  Component Value Date   INSULIN 7.9 01/24/2018   CBC    Component Value Date/Time   WBC 7.4 01/24/2018 1307   WBC 5.4 05/08/2017 1032   RBC 4.45 01/24/2018 1307   RBC 4.31 05/08/2017 1032   HGB 13.0 01/24/2018 1307   HCT 38.1 01/24/2018 1307   PLT 265 01/24/2018 1307   MCV 86 01/24/2018 1307   MCH 29.2 01/24/2018 1307   MCHC 34.1 01/24/2018 1307   MCHC 33.3 05/08/2017 1032   RDW 12.8 01/24/2018 1307   LYMPHSABS 1.2 01/24/2018 1307   MONOABS 0.5 05/08/2017 1032   EOSABS 0.4 01/24/2018 1307   BASOSABS 0.1 01/24/2018 1307   Iron/TIBC/Ferritin/ %Sat    Component Value Date/Time   IRON 102 05/08/2017 1032   IRON 102 05/08/2017 1032   FERRITIN 64.0 05/08/2017 1032   IRONPCTSAT 19.9 (L) 05/08/2017 1032   Lipid Panel     Component Value Date/Time   CHOL 217 (H) 01/24/2018 1307   TRIG 128 01/24/2018 1307   HDL 56 01/24/2018 1307   CHOLHDL 3.9 01/24/2018 1307   CHOLHDL 2 11/13/2017 0938   VLDL 11.4 11/13/2017 0938   LDLCALC 135 (H) 01/24/2018 1307   LDLDIRECT 198.6 03/20/2009 1127   Hepatic Function Panel     Component Value Date/Time   PROT 6.7 01/24/2018 1307   ALBUMIN 4.4 01/24/2018 1307   AST 15 01/24/2018 1307   ALT 13 01/24/2018 1307   ALKPHOS 48 01/24/2018 1307   BILITOT 0.4 01/24/2018 1307   BILIDIR 0.1 08/14/2014 0824      Component Value Date/Time   TSH 1.050 01/24/2018 1307   TSH 1.28 11/07/2016  1008   TSH 0.98 09/27/2011 0945  Results for Zachary Michael, Zachary Michael (MRN [id number]) as of 02/05/2018 14:13  Ref. Range 01/24/2018 13:07  Vitamin D, 25-Hydroxy Latest Ref Range: 30.0 - 100.0 ng/mL 26.5 (L)    ASSESSMENT AND PLAN: Type 2 diabetes mellitus with hyperglycemia, without long-term current use of insulin (HCC) - Plan: pioglitazone (ACTOS) 45 MG tablet, glimepiride (AMARYL) 4 MG tablet  Vitamin D deficiency - Plan: Vitamin D, Ergocalciferol, (DRISDOL) 1.25 MG (50000 UT) CAPS capsule  Other hyperlipidemia  Essential tremor  Class 3 severe obesity with serious comorbidity and body mass index (BMI) of 45.0 to 49.9 in adult, unspecified obesity type (HCC)  PLAN:  Diabetes II with Hyperglycemia Zachary Michael has been given extensive diabetes education by myself today including ideal fasting and post-prandial blood glucose readings, individual ideal Hgb A1c goals and hypoglycemia prevention. We discussed the importance of good blood sugar control to decrease the likelihood of diabetic complications such as nephropathy, neuropathy, limb loss, blindness, coronary artery disease, and death. We discussed the importance of intensive lifestyle modification including diet, exercise and weight loss as the first line treatment for diabetes. Zachary Michael agrees to continue taking statin, ACE, and metformin. He agrees to start Actos 45 mg PO daily #30 with no refills. Zachary Michael agrees to continue taking glimepiride 4 mg PO q AM #30 and we will refill for 1 month. We will follow up on BGs at next appointment. Zachary Michael agrees to follow up with our clinic in 2 weeks.  Vitamin D Deficiency Zachary Michael was informed that low vitamin D levels contributes to fatigue and are associated with obesity, breast, and colon cancer. Zachary Michael agrees to start  prescription Vit D @50 ,000 IU every week #4 with no refills. He will follow up for routine testing of vitamin D, at least 2-3 times per year. He was informed of the risk of over-replacement  of vitamin D and agrees to not increase his dose unless he discusses this with Zachary Michael first. Zachary Michael agrees to follow up with our clinic in 2 weeks.  Hyperlipidemia Zachary Michael was informed of the American Heart Association Guidelines emphasizing intensive lifestyle modifications as the first line treatment for hyperlipidemia. We discussed many lifestyle modifications today in depth, and Zachary Michael will continue to work on decreasing saturated fats such as fatty red meat, butter and many fried foods. He will also increase vegetables and lean protein in his diet and continue to work on exercise and weight loss efforts. We will repeat FLP in early February. Zachary Michael agrees to follow up with our clinic in 2 weeks.  Essential Tremor We will follow up at next appointment. Zachary Michael agrees to follow up with our clinic in 2 weeks.  Obesity Zachary Michael is currently in the action stage of change. As such, his goal is to continue with weight loss efforts He has agreed to follow the Category 3 plan Zachary Michael has been instructed to work up to a goal of 150 minutes of combined cardio and strengthening exercise per week for weight loss and overall health benefits. We discussed the following Behavioral Modification Strategies today: increasing lean protein intake, work on meal planning and easy cooking plans, dealing with family or coworker sabotage, holiday eating strategies, and planning for success    Zachary Michael has agreed to follow up with our clinic in 2 weeks. He was informed of the importance of frequent follow up visits to maximize his success with intensive lifestyle modifications for his multiple health conditions.   OBESITY BEHAVIORAL INTERVENTION VISIT  Today's visit was # 2   Starting weight: 301 lbs Starting date: 01/24/18 Today's weight : 298 lbs Today's date: 01/30/2018 Total lbs lost to date: 3 At least 15 minutes were spent on discussing the following behavioral intervention visit.   ASK: We discussed the  diagnosis of obesity with Zachary Michael today and Jaelen agreed to give Zachary Michael permission to discuss obesity behavioral modification therapy today.  ASSESS: Zachary Michael has the diagnosis of obesity and his BMI today is 45.32 Chidera is in the action stage of change   ADVISE: [name] was educated on the multiple health risks of obesity as well as the benefit of weight loss to improve his health. He was advised of the need for long term treatment and the importance of lifestyle modifications to improve his current health and to decrease his risk of future health problems.  AGREE: Multiple dietary modification options and treatment options were discussed and  Cornellius agreed to follow the recommendations documented in the above note.  ARRANGE: Orland was educated on the importance of frequent visits to treat obesity as outlined per CMS and USPSTF guidelines and agreed to schedule his next follow up appointment today.  I, Burt Knack, am acting as transcriptionist for Debbra Riding, MD  I have reviewed the above documentation for accuracy and completeness, and I agree with the above. - Debbra Riding, MD

## 2018-02-05 NOTE — Telephone Encounter (Signed)
I called pt, offered him an appt with Dr. Frances FurbishAthar this week but pt declined the appt. Pt is agreeable to seeing Dr. Frances FurbishAthar on 03/06/18 at 1:00pm. Pt verbalized understanding of new appt date and time.

## 2018-02-14 ENCOUNTER — Ambulatory Visit (INDEPENDENT_AMBULATORY_CARE_PROVIDER_SITE_OTHER): Payer: Medicare Other | Admitting: Family Medicine

## 2018-02-14 ENCOUNTER — Encounter (INDEPENDENT_AMBULATORY_CARE_PROVIDER_SITE_OTHER): Payer: Self-pay | Admitting: Family Medicine

## 2018-02-14 VITALS — BP 126/69 | HR 79 | Temp 97.4°F | Ht 68.0 in | Wt 290.0 lb

## 2018-02-14 DIAGNOSIS — Z794 Long term (current) use of insulin: Secondary | ICD-10-CM

## 2018-02-14 DIAGNOSIS — E1165 Type 2 diabetes mellitus with hyperglycemia: Secondary | ICD-10-CM

## 2018-02-14 DIAGNOSIS — Z6841 Body Mass Index (BMI) 40.0 and over, adult: Secondary | ICD-10-CM

## 2018-02-14 DIAGNOSIS — E559 Vitamin D deficiency, unspecified: Secondary | ICD-10-CM | POA: Diagnosis not present

## 2018-02-14 MED ORDER — GLIMEPIRIDE 2 MG PO TABS: 2.0000 mg | ORAL_TABLET | Freq: Every day | ORAL | 0 refills | Status: DC

## 2018-02-14 NOTE — Progress Notes (Signed)
Office: [phone]  /  Fax: [phone]   HPI:   Chief Complaint: OBESITY Zachary Michael is here to discuss his progress with his obesity treatment plan. He is on the Category 3 plan and is following his eating plan approximately 100 % of the time. He states he is walking 1 mile per day. Zachary Michael is noticing a slight increase in presence of hunger 3 hours after eating. He is not getting all snacks in daily, normally getting 1-2. He is trying to work up to 10 oz of meat at dinner. He is looking for some variety.  His weight is 290 lb (131.5 kg) today and has had a weight loss of 8 pounds over a period of 2 weeks since his last visit. He has lost 11 lbs since starting treatment with us.  Diabetes II with Hyperglycemia Zachary Michael has a diagnosis of diabetes type II. Zachary Michael states fasting BGs range between 69 and 132. He is on Actos, Amaryl, metformin, statin, and ACE. Last A1c was 6.9. He has been working on intensive lifestyle modifications including diet, exercise, and weight loss to help control his blood glucose levels.  Vitamin D Deficiency Zachary Michael has a diagnosis of vitamin D deficiency. He is currently taking prescription Vit D. He notes fatigue and denies nausea, vomiting or muscle weakness.  ASSESSMENT AND PLAN:  Type 2 diabetes mellitus with hyperglycemia, with long-term current use of insulin (HCC) - Plan: glimepiride (AMARYL) 2 MG tablet  Vitamin D deficiency  Class 3 severe obesity with serious comorbidity and body mass index (BMI) of 40.0 to 44.9 in adult, unspecified obesity type (HCC)  PLAN:  Diabetes II with Hyperglycemia Zachary Michael has been given extensive diabetes education by myself today including ideal fasting and post-prandial blood glucose readings, individual ideal Hgb A1c goals and hypoglycemia prevention. We discussed the importance of good blood sugar control to decrease the likelihood of diabetic complications such as nephropathy, neuropathy, limb loss, blindness,  coronary artery disease, and death. We discussed the importance of intensive lifestyle modification including diet, exercise and weight loss as the first line treatment for diabetes. Zachary Michael agrees to decrease glimepiride to 2 mg PO daily #30 with no refills. Zachary Michael agrees to follow up with our clinic in 2 weeks.  Vitamin D Deficiency Zachary Michael was informed that low vitamin D levels contributes to fatigue and are associated with obesity, breast, and colon cancer. Zachary Michael agrees to continue taking prescription Vit D @50 ,000 IU every week, no refill needed. He will follow up for routine testing of vitamin D, at least 2-3 times per year. He was informed of the risk of over-replacement of vitamin D and agrees to not increase his dose unless he discusses this with us first. Zachary Michael agrees to follow up with our clinic in 2 weeks.  Obesity Zachary Michael is currently in the action stage of change. As such, his goal is to continue with weight loss efforts He has agreed to follow the Category 3 plan Zachary Michael has been instructed to work up to a goal of 150 minutes of combined cardio and strengthening exercise per week for weight loss and overall health benefits. We discussed the following Behavioral Modification Strategies today: increasing lean protein intake, work on meal planning and easy cooking plans, holiday eating strategies, travel eating strategies, and planning for success   Zachary Michael has agreed to follow up with our clinic in 2 weeks. He was informed of the importance of frequent follow up visits to maximize his success with intensive lifestyle modifications for his multiple health  conditions.  ALLERGIES: No Known Allergies  MEDICATIONS: Current Outpatient Medications on File Prior to Visit  Medication Sig Dispense Refill  . albuterol (PROAIR HFA) 108 (90 BASE) MCG/ACT inhaler Inhale 2 puffs into the lungs every 6 (six) hours as needed for wheezing or shortness of breath. 1 Inhaler 4  . aspirin 81 MG  tablet Take 81 mg by mouth daily.      [name] b complex vitamins tablet Take 1 tablet by mouth daily.    . calcium carbonate (CALCIUM 600) 600 MG TABS tablet Take 600 mg by mouth daily with breakfast.    . carvedilol (COREG) 6.25 MG tablet Take 1 tablet (6.25 mg total) by mouth 2 (two) times daily with a meal. 180 tablet 1  . Cinnamon 500 MG capsule Take 500 mg by mouth daily.    [name] co-enzyme Q-10 30 MG capsule Take 30 mg by mouth 3 (three) times daily.    . fenofibrate 160 MG tablet Take 1 tablet (160 mg total) by mouth daily. 90 tablet 1  . ferrous sulfate 325 (65 FE) MG EC tablet Take 325 mg by mouth 3 (three) times daily with meals.    [name] glimepiride (AMARYL) 4 MG tablet Take 1 tablet (4 mg total) by mouth daily with breakfast. 30 tablet 0  . glucose blood (ONETOUCH VERIO) test strip Use as instructed 100 each 0  . lisinopril (PRINIVIL,ZESTRIL) 10 MG tablet Take 1 tablet (10 mg total) by mouth daily. 90 tablet 1  . metFORMIN (GLUCOPHAGE) 1000 MG tablet Take 1 tablet (1,000 mg total) by mouth 2 (two) times daily with a meal. 180 tablet 1  . Multiple Vitamin (MULTIVITAMIN) capsule Take 1 capsule by mouth daily.    . nitroGLYCERIN (NITROSTAT) 0.4 MG SL tablet Place 1 tablet (0.4 mg total) under the tongue every 5 (five) minutes as needed for chest pain. 25 tablet 5  . pioglitazone (ACTOS) 45 MG tablet Take 1 tablet (45 mg total) by mouth daily. 30 tablet 0  . simvastatin (ZOCOR) 40 MG tablet Take 1 tablet (40 mg total) by mouth at bedtime. 90 tablet 1  . Vitamin D, Ergocalciferol, (DRISDOL) 1.25 MG (50000 UT) CAPS capsule Take 1 capsule (50,000 Units total) by mouth every 7 (seven) days. 4 capsule 0  . Zinc 100 MG TABS Take 1 tablet by mouth daily.     No current facility-administered medications on file prior to visit.     PAST MEDICAL HISTORY: Past Medical History:  Diagnosis Date  . Asthma   . Back pain   . CAD (coronary artery disease)    Mild  . Diabetes mellitus   . Heart valve problem    . HTN (hypertension)   . Hyperlipidemia   . Joint pain   . OSA (obstructive sleep apnea) 08/09/2017  . Palpitations   . SOB (shortness of breath)   . Subluxation    L2-3-4    PAST SURGICAL HISTORY: Past Surgical History:  Procedure Laterality Date  . APPENDECTOMY    . CARDIAC CATHETERIZATION     We recently performed a which revealed minor coronary artery irregularities. He had normal left ventricle systolic function with an EF of 60%  . CARDIOVASCULAR STRESS TEST     EF 63%  . TONSILLECTOMY    . TUMOR REMOVAL     LEFT ARM    SOCIAL HISTORY: Social History   Tobacco Use  . Smoking status: Former Smoker    Years: 7.00    Types: Pipe  Last attempt to quit: 1980    Years since quitting: 39.9  . Smokeless tobacco: Never Used  Substance Use Topics  . Alcohol use: Yes    Comment: occassional  . Drug use: No    FAMILY HISTORY: Family History  Problem Relation Age of Onset  . Pneumonia Mother   . Osteoporosis Mother   . Arthritis Mother   . Diabetes Mother   . Hypertension Mother   . Cancer Father        leukemia, lung  . Cancer Brother   . Diabetes Maternal Aunt   . Hypertension Maternal Aunt   . Hypertension Maternal Aunt   . Lung cancer Unknown     ROS: Review of Systems  Constitutional: Positive for malaise/fatigue and weight loss.  Gastrointestinal: Negative for nausea and vomiting.  Musculoskeletal:       Negative muscle weakness  Endo/Heme/Allergies:       Negative hypoglycemia    PHYSICAL EXAM: Blood pressure 126/69, pulse 79, temperature (!) 97.4 F (36.3 C), temperature source Oral, height 5\' 8"  (1.727 m), weight 290 lb (131.5 kg), SpO2 96 %. Body mass index is 44.09 kg/m. Physical Exam Vitals signs reviewed.  Constitutional:      Appearance: Normal appearance. He is obese.  Cardiovascular:     Rate and Rhythm: Normal rate.  Pulmonary:     Effort: Pulmonary effort is normal.  Musculoskeletal: Normal range of motion.  Skin:     General: Skin is warm and dry.  Neurological:     Mental Status: He is alert and oriented to person, place, and time.  Psychiatric:        Mood and Affect: Mood normal.        Behavior: Behavior normal.     RECENT LABS AND TESTS: BMET    Component Value Date/Time   NA 139 01/24/2018 1307   K 4.1 01/24/2018 1307   CL 102 01/24/2018 1307   CO2 23 01/24/2018 1307   GLUCOSE 108 (H) 01/24/2018 1307   GLUCOSE 117 (H) 11/13/2017 0938   BUN 13 01/24/2018 1307   CREATININE 0.74 (L) 01/24/2018 1307   CALCIUM 9.3 01/24/2018 1307   GFRNONAA 95 01/24/2018 1307   GFRAA 110 01/24/2018 1307   Lab Results  Component Value Date   HGBA1C 6.9 (H) 11/13/2017   HGBA1C 6.9 (H) 08/01/2017   HGBA1C 7.5 (H) 05/08/2017   HGBA1C 6.7 (H) 11/07/2016   HGBA1C 7.2 (H) 08/17/2016   Lab Results  Component Value Date   INSULIN 7.9 01/24/2018   CBC    Component Value Date/Time   WBC 7.4 01/24/2018 1307   WBC 5.4 05/08/2017 1032   RBC 4.45 01/24/2018 1307   RBC 4.31 05/08/2017 1032   HGB 13.0 01/24/2018 1307   HCT 38.1 01/24/2018 1307   PLT 265 01/24/2018 1307   MCV 86 01/24/2018 1307   MCH 29.2 01/24/2018 1307   MCHC 34.1 01/24/2018 1307   MCHC 33.3 05/08/2017 1032   RDW 12.8 01/24/2018 1307   LYMPHSABS 1.2 01/24/2018 1307   MONOABS 0.5 05/08/2017 1032   EOSABS 0.4 01/24/2018 1307   BASOSABS 0.1 01/24/2018 1307   Iron/TIBC/Ferritin/ %Sat    Component Value Date/Time   IRON 102 05/08/2017 1032   IRON 102 05/08/2017 1032   FERRITIN 64.0 05/08/2017 1032   IRONPCTSAT 19.9 (L) 05/08/2017 1032   Lipid Panel     Component Value Date/Time   CHOL 217 (H) 01/24/2018 1307   TRIG 128 01/24/2018 1307   HDL  56 01/24/2018 1307   CHOLHDL 3.9 01/24/2018 1307   CHOLHDL 2 11/13/2017 0938   VLDL 11.4 11/13/2017 0938   LDLCALC 135 (H) 01/24/2018 1307   LDLDIRECT 198.6 03/20/2009 1127   Hepatic Function Panel     Component Value Date/Time   PROT 6.7 01/24/2018 1307   ALBUMIN 4.4 01/24/2018  1307   AST 15 01/24/2018 1307   ALT 13 01/24/2018 1307   ALKPHOS 48 01/24/2018 1307   BILITOT 0.4 01/24/2018 1307   BILIDIR 0.1 08/14/2014 0824      Component Value Date/Time   TSH 1.050 01/24/2018 1307   TSH 1.28 11/07/2016 1008   TSH 0.98 09/27/2011 0945      OBESITY BEHAVIORAL INTERVENTION VISIT  Today's visit was # 3   Starting weight: 301 lbs Starting date: 01/24/18 Today's weight : 290 lbs Today's date: 02/14/2018 Total lbs lost to date: 11 At least 15 minutes were spent on discussing the following behavioral intervention visit.   ASK: We discussed the diagnosis of obesity with [name] today and Herschel agreed to give [name] permission to discuss obesity behavioral modification therapy today.  ASSESS: Shulem has the diagnosis of obesity and his BMI today is 44.1 Zaylyn is in the action stage of change   ADVISE: Chima was educated on the multiple health risks of obesity as well as the benefit of weight loss to improve his health. He was advised of the need for long term treatment and the importance of lifestyle modifications to improve his current health and to decrease his risk of future health problems.  AGREE: Multiple dietary modification options and treatment options were discussed and  Osborn agreed to follow the recommendations documented in the above note.  ARRANGE: Lajarvis was educated on the importance of frequent visits to treat obesity as outlined per CMS and USPSTF guidelines and agreed to schedule his next follow up appointment today.  I, Burt Knack, am acting as transcriptionist for Debbra Riding, MD  I have reviewed the above documentation for accuracy and completeness, and I agree with the above. - Debbra Riding, MD

## 2018-03-06 ENCOUNTER — Encounter: Payer: Self-pay | Admitting: Neurology

## 2018-03-06 ENCOUNTER — Ambulatory Visit (INDEPENDENT_AMBULATORY_CARE_PROVIDER_SITE_OTHER): Payer: Medicare Other | Admitting: Neurology

## 2018-03-06 VITALS — BP 118/58 | HR 71 | Ht 69.0 in | Wt 293.0 lb

## 2018-03-06 DIAGNOSIS — G250 Essential tremor: Secondary | ICD-10-CM

## 2018-03-06 NOTE — Patient Instructions (Addendum)
You have a mild tremor of both hands, likely essential tremor.  I do not see any signs or symptoms of parkinson's like disease or what we call parkinsonism.   For your tremor, I would not recommend any new medication for fear of side effects (especially sleepiness) or medication interactions, especially in light of your balance problems.   We do not have to make a follow up appointment.   Please remember, that any kind of tremor may be exacerbated by anxiety, anger, nervousness, excitement, dehydration, sleep deprivation, by caffeine, and low blood sugar values or blood sugar fluctuations. Some medications can exacerbate tremors, this includes antidepressant medication and asthma inhalers or nebulizer.

## 2018-03-06 NOTE — Progress Notes (Signed)
Subjective:    Patient ID: Zachary Michael is a 68 y.o. male.  HPI     Zachary FoleySaima Cameo Shewell, MD, PhD [facility] [address], [address] [address] [place], [place] [address]  Dear Dr. Rinaldo RatelKadolph,   I saw your patient, Zachary Michael, upon your kind request in my neurologic clinic today for initial consultation of his hand tremors. The patient is accompanied by his wife today. As you know, Zachary Michael is a 68 year old right-handed gentleman with an underlying medical history of coronary artery disease, asthma, hypertension, hyperlipidemia, diabetes, palpitations, OSA and morbid obesity with a BMI of over 40, who reports bilateral hand tremors for years, likely over 5 years' duration.  I reviewed your office note from 01/24/2018. He does not use a CPAP machine, but was offered one.  His mother had hand tremors. No other FHx of tremor. Has had some progression. He has had some balance problems. He has had some near falls. He occasionally feels lightheaded or dizzy upon standing up quickly. He has a history of asthma and uses a rescue inhaler. He does not need to take medicine on a daily basis for this.  He has noted changes in his fine motor skills, he used to be able to work with model trains and no longer is able to do so. His handwriting has been affected as well. Nevertheless, he is not limited in his day-to-day activities. He tries to hydrate well with water. He drinks I'll call occasionally, about once a month, no [place] caffeine at this time, was never a cigarette smoker.  His Past Medical History Is Significant For: Past Medical History:  Diagnosis Date  . Asthma   . Back pain   . CAD (coronary artery disease)    Mild  . Diabetes mellitus   . Heart valve problem   . HTN (hypertension)   . Hyperlipidemia   . Joint pain   . OSA (obstructive sleep apnea) 08/09/2017  . Palpitations   . SOB (shortness of breath)   . Subluxation    L2-3-4    His Past Surgical History Is  Significant For: Past Surgical History:  Procedure Laterality Date  . APPENDECTOMY    . CARDIAC CATHETERIZATION     We recently performed a which revealed minor coronary artery irregularities. He had normal left ventricle systolic function with an EF of 60%  . CARDIOVASCULAR STRESS TEST     EF 63%  . TONSILLECTOMY    . TUMOR REMOVAL     LEFT ARM    His Family History Is Significant For: Family History  Problem Relation Age of Onset  . Pneumonia Mother   . Osteoporosis Mother   . Arthritis Mother   . Diabetes Mother   . Hypertension Mother   . Cancer Father        leukemia, lung  . Cancer Brother   . Diabetes Maternal Aunt   . Hypertension Maternal Aunt   . Hypertension Maternal Aunt   . Lung cancer Unknown     His Social History Is Significant For: Social History   Socioeconomic History  . Marital status: Married    Spouse name: Zachary Michael  . Number of children: 3  . Years of education: Not on file  . Highest education level: Not on file  Occupational History  . 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    Comment: RETIRED  Social Needs  . Financial resource strain: Not on file  . Food insecurity:    Worry: Not on  file    Inability: Not on file  . Transportation needs:    Medical: Not on file    Non-medical: Not on file  Tobacco Use  . Smoking status: Former Smoker    Years: 7.00    Types: Pipe    Last attempt to quit: 1980    Years since quitting: 40.0  . Smokeless tobacco: Never Used  Substance and Sexual Activity  . Alcohol use: Yes    Comment: occassional  . Drug use: No  . Sexual activity: Yes    Partners: Female  Lifestyle  . Physical activity:    Days per week: Not on file    Minutes per session: Not on file  . Stress: Not on file  Relationships  . Social connections:    Talks on phone: Not on file    Gets together: Not on file    Attends religious service: Not on file    Active member of club or organization: Not on file    Attends meetings of clubs  or organizations: Not on file    Relationship status: Not on file  Other Topics Concern  . Not on file  Social History Narrative   Exercise-- walking at work    His Allergies Are:  No Known Allergies:   His Current Medications Are:  Outpatient Encounter Medications as of 03/06/2018  Medication Sig  . albuterol (PROAIR HFA) 108 (90 BASE) MCG/ACT inhaler Inhale 2 puffs into the lungs every 6 (six) hours as needed for wheezing or shortness of breath.  [name] aspirin 81 MG tablet Take 81 mg by mouth daily.    [name] b complex vitamins tablet Take 1 tablet by mouth daily.  . calcium carbonate (CALCIUM 600) 600 MG TABS tablet Take 600 mg by mouth daily with breakfast.  . carvedilol (COREG) 6.25 MG tablet Take 1 tablet (6.25 mg total) by mouth 2 (two) times daily with a meal.  . Cinnamon 500 MG capsule Take 500 mg by mouth daily.  [name] co-enzyme Q-10 30 MG capsule Take 30 mg by mouth 3 (three) times daily.  . fenofibrate 160 MG tablet Take 1 tablet (160 mg total) by mouth daily.  . ferrous sulfate 325 (65 FE) MG EC tablet Take 325 mg by mouth 3 (three) times daily with meals.  [name] glimepiride (AMARYL) 2 MG tablet Take 1 tablet (2 mg total) by mouth daily before breakfast.  . glucose blood (ONETOUCH VERIO) test strip Use as instructed  . lisinopril (PRINIVIL,ZESTRIL) 10 MG tablet Take 1 tablet (10 mg total) by mouth daily.  . metFORMIN (GLUCOPHAGE) 1000 MG tablet Take 1 tablet (1,000 mg total) by mouth 2 (two) times daily with a meal.  . Multiple Vitamin (MULTIVITAMIN) capsule Take 1 capsule by mouth daily.  . nitroGLYCERIN (NITROSTAT) 0.4 MG SL tablet Place 1 tablet (0.4 mg total) under the tongue every 5 (five) minutes as needed for chest pain.  . pioglitazone (ACTOS) 45 MG tablet Take 1 tablet (45 mg total) by mouth daily.  . simvastatin (ZOCOR) 40 MG tablet Take 1 tablet (40 mg total) by mouth at bedtime.  . Vitamin D, Ergocalciferol, (DRISDOL) 1.25 MG (50000 UT) CAPS capsule Take 1 capsule (50,000 Units  total) by mouth every 7 (seven) days.  . Zinc 100 MG TABS Take 1 tablet by mouth daily.  . [DISCONTINUED] glimepiride (AMARYL) 4 MG tablet TAKE 1 TABLET BY MOUTH DAILY WITH BREAKFAST   No facility-administered encounter medications on file as of 03/06/2018.   :  Review of Systems:  Out of a complete 14 point review of systems, all are reviewed and negative with the exception of these symptoms as listed below:  Review of Systems  Neurological:       Pt presents today to discuss his tremors. Pt  has noticed bilateral hand tremors for several years now. Pt is right handed.    Objective:  Neurological Exam  Physical Exam Physical Examination:   Vitals:   03/06/18 1305  BP: (!) 118/58  Pulse: 71   General Examination: The patient is a very pleasant 68 y.o. male in no acute distress. He appears well-developed and well-nourished and well groomed.   HEENT: Normocephalic, atraumatic, pupils are equal, round and reactive to light and accommodation. Funduscopic exam is normal with sharp disc margins noted. Extraocular tracking is good without limitation to gaze excursion or nystagmus noted. Normal smooth pursuit is noted. Hearing is grossly intact. Tympanic membranes are clear bilaterally. Face is symmetric with normal facial animation and normal facial sensation. Speech is clear with no dysarthria noted. There is no hypophonia. There is no lip, neck/head, jaw or voice tremor. Neck is supple with full range of passive and active motion. There are no carotid bruits on auscultation. Oropharynx exam reveals: mild mouth dryness, adequate dental hygiene and moderate airway crowding. Mallampati is class II. Tongue protrudes centrally and palate elevates symmetrically.    Chest: Clear to auscultation without wheezing, rhonchi or crackles noted.  Heart: S1+S2+0, regular and normal without murmurs, rubs or gallops noted.   Abdomen: Soft, non-tender and non-distended with normal bowel sounds appreciated on auscultation.  Extremities: There is 1+ pitting edema in the distal lower extremities bilaterally.   Skin: Warm and dry without trophic changes noted.  Musculoskeletal: exam reveals no obvious joint deformities, tenderness or joint swelling or erythema.   Neurologically:  Mental status: The patient is awake, alert and oriented in all 4 spheres. His immediate and remote memory, attention, language skills and fund of knowledge are appropriate. There is no evidence of aphasia, agnosia, apraxia or  anomia. Speech is clear with normal prosody and enunciation. Thought process is linear. Mood is normal and affect is normal.  Cranial nerves II - XII are as described above under HEENT exam. In addition: shoulder shrug is normal with equal shoulder height noted. Motor exam: Normal bulk, strength and tone is noted. There is no drift, or rebound. On 03/06/2018: Archimedes spiral drawing he has minimal trembling with both hands, handwriting with his right hand which is his dominant hand is tremulous, not particularly micrographic, but legible. He has a bilateral upper extremity postural and action tremor, in the mild range, no obvious resting tremor with the exception of intermittent right thumb trembling at rest. No intention tremor.  Romberg is negative. Reflexes are 1+ throughout. Fine motor skills and coordination: intact with normal finger taps, normal hand movements, normal rapid alternating patting, normal foot taps and normal foot agility.  Cerebellar testing: No dysmetria or intention tremor on finger to nose testing. Heel to shin is unremarkable bilaterally. There is no truncal or gait ataxia.  Sensory exam: intact to light touch in the upper and lower extremities.  Gait, station and balance: He stands without difficulty, no shuffling, preserved arm swing while walking. Posture is age-appropriate.  Assessment and Plan:   In summary, Zachary DroneStephen P Sarra is a very pleasant 68 y.o.-year old male with an underlying medical history of coronary artery disease, asthma, hypertension, hyperlipidemia, diabetes, palpitations, OSA and morbid obesity with a  BMI of over 40, Who presents for evaluation of his hand tremors of several years duration with a positive family history of hand tremors affecting his mother. His history and examination are in keeping with mild essential tremor. He has no telltale signs of parkinsonism and is reassured in that regard. I would not recommend any symptomatically medications given  that he is already on a beta blocker, has a history of asthma, and is complaining of some balance problems. [place]oline would be a potential consideration but can cause sleepiness and balance issues especially in the elderly. I suggested that he follow-up with me on an as-needed basis. We talked about potential tremor triggers. He is advised to stay well-hydrated with water.   I answered all their questions today and the patient and his wife were in agreement.  Thank you very much for allowing me to participate in the care of this nice patient. If I can be of any further assistance to you please do not hesitate to call me at [phone].  Sincerely,   Zachary Foley, MD, PhD

## 2018-03-12 ENCOUNTER — Encounter (INDEPENDENT_AMBULATORY_CARE_PROVIDER_SITE_OTHER): Payer: Self-pay | Admitting: Family Medicine

## 2018-03-12 ENCOUNTER — Ambulatory Visit (INDEPENDENT_AMBULATORY_CARE_PROVIDER_SITE_OTHER): Payer: Medicare Other | Admitting: Family Medicine

## 2018-03-12 VITALS — BP 113/68 | HR 77 | Temp 98.0°F | Ht 69.0 in | Wt 284.0 lb

## 2018-03-12 DIAGNOSIS — E669 Obesity, unspecified: Secondary | ICD-10-CM

## 2018-03-12 DIAGNOSIS — E1165 Type 2 diabetes mellitus with hyperglycemia: Secondary | ICD-10-CM | POA: Diagnosis not present

## 2018-03-12 DIAGNOSIS — Z6832 Body mass index (BMI) 32.0-32.9, adult: Secondary | ICD-10-CM

## 2018-03-12 DIAGNOSIS — E559 Vitamin D deficiency, unspecified: Secondary | ICD-10-CM

## 2018-03-12 DIAGNOSIS — Z6841 Body Mass Index (BMI) 40.0 and over, adult: Secondary | ICD-10-CM | POA: Diagnosis not present

## 2018-03-12 MED ORDER — VITAMIN D (ERGOCALCIFEROL) 1.25 MG (50000 UNIT) PO CAPS: 50000.0000 [IU] | ORAL_CAPSULE | ORAL | 0 refills | Status: DC

## 2018-03-13 NOTE — Progress Notes (Signed)
Office: [phone]  /  Fax: [phone]   HPI:   Chief Complaint: OBESITY Zachary Michael is here to discuss his progress with his obesity treatment plan. He is on the Category 3 plan and is following his eating plan approximately 60 % of the time. He states he is walking 3,000-4,000 steps a day. Zachary Michael found it somewhat difficult to follow the plan with life and family obligations. He is missing ethic food, and wants more variety for dinner. He is still not eating all the meat at dinner meal. He notes no obstacles in the next 2 weeks.  His weight is 284 lb (128.8 kg) today and has had a weight loss of 6 pounds over a period of 3 to 4 weeks since his last visit. He has lost 17 lbs since starting treatment with Zachary Michael.  Vitamin D Deficiency Zachary Michael has a diagnosis of vitamin D deficiency. He is currently taking prescription Vit D. He notes fatigue and denies nausea, vomiting or muscle weakness.  Diabetes II with Hyperglycemia Zachary Michael has a diagnosis of diabetes type II. Zachary Michael is on Actos, lisinopril, aspirin, statin, and metformin. He states fasting BGs range between 72 and 140, and he denies hypoglycemia. Last A1c was 6.9. He has been working on intensive lifestyle modifications including diet, exercise, and weight loss to help control his blood glucose levels.  ASSESSMENT AND PLAN:  Vitamin D deficiency - Plan: Vitamin D, Ergocalciferol, (DRISDOL) 1.25 MG (50000 UT) CAPS capsule  Type 2 diabetes mellitus with hyperglycemia, without long-term current use of insulin (HCC)  Class 3 severe obesity with serious comorbidity and body mass index (BMI) of 40.0 to 44.9 in adult, unspecified obesity type (HCC)  PLAN:  Vitamin D Deficiency Zachary Michael was informed that low vitamin D levels contributes to fatigue and are associated with obesity, breast, and colon cancer. Zachary Michael agrees to continue taking prescription Vit D @50 ,000 IU every week #4 and we will refill for 1 month. He will follow up for  routine testing of vitamin D, at least 2-3 times per year. He was informed of the risk of over-replacement of vitamin D and agrees to not increase his dose unless he discusses this with Zachary Michael first. Zachary Michael agrees to follow up with our clinic in 2 weeks.  Diabetes II with Hyperglycemia Zachary Michael has been given extensive diabetes education by myself today including ideal fasting and post-prandial blood glucose readings, individual ideal Hgb A1c goals and hypoglycemia prevention. We discussed the importance of good blood sugar control to decrease the likelihood of diabetic complications such as nephropathy, neuropathy, limb loss, blindness, coronary artery disease, and death. We discussed the importance of intensive lifestyle modification including diet, exercise and weight loss as the first line treatment for diabetes. Zachary Michael agrees to continue his current diabetes medications and he agrees to follow up with our clinic in 2 weeks.  Obesity Zachary Michael is currently in the action stage of change. As such, his goal is to continue with weight loss efforts He has agreed to keep a food journal with 450-600 calories and 40+ grams of protein at supper daily and follow the Category 3 plan Zachary Michael has been instructed to work up to a goal of 150 minutes of combined cardio and strengthening exercise per week for weight loss and overall health benefits. We discussed the following Behavioral Modification Strategies today: increasing lean protein intake, work on meal planning and easy cooking plans, better snacking choices, and planning for success   Zachary Michael has agreed to follow up with our clinic in  2 weeks. He was informed of the importance of frequent follow up visits to maximize his success with intensive lifestyle modifications for his multiple health conditions.  ALLERGIES: No Known Allergies  MEDICATIONS: Current Outpatient Medications on File Prior to Visit  Medication Sig Dispense Refill  . albuterol (PROAIR  HFA) 108 (90 BASE) MCG/ACT inhaler Inhale 2 puffs into the lungs every 6 (six) hours as needed for wheezing or shortness of breath. 1 Inhaler 4  . aspirin 81 MG tablet Take 81 mg by mouth daily.      [name] b complex vitamins tablet Take 1 tablet by mouth daily.    . calcium carbonate (CALCIUM 600) 600 MG TABS tablet Take 600 mg by mouth daily with breakfast.    . carvedilol (COREG) 6.25 MG tablet Take 1 tablet (6.25 mg total) by mouth 2 (two) times daily with a meal. 180 tablet 1  . Cinnamon 500 MG capsule Take 500 mg by mouth daily.    [name] co-enzyme Q-10 30 MG capsule Take 30 mg by mouth 3 (three) times daily.    . fenofibrate 160 MG tablet Take 1 tablet (160 mg total) by mouth daily. 90 tablet 1  . ferrous sulfate 325 (65 FE) MG EC tablet Take 325 mg by mouth 3 (three) times daily with meals.    [name] glimepiride (AMARYL) 2 MG tablet Take 1 tablet (2 mg total) by mouth daily before breakfast. 30 tablet 0  . glucose blood (ONETOUCH VERIO) test strip Use as instructed 100 each 0  . lisinopril (PRINIVIL,ZESTRIL) 10 MG tablet Take 1 tablet (10 mg total) by mouth daily. 90 tablet 1  . metFORMIN (GLUCOPHAGE) 1000 MG tablet Take 1 tablet (1,000 mg total) by mouth 2 (two) times daily with a meal. 180 tablet 1  . Multiple Vitamin (MULTIVITAMIN) capsule Take 1 capsule by mouth daily.    . nitroGLYCERIN (NITROSTAT) 0.4 MG SL tablet Place 1 tablet (0.4 mg total) under the tongue every 5 (five) minutes as needed for chest pain. 25 tablet 5  . pioglitazone (ACTOS) 45 MG tablet Take 1 tablet (45 mg total) by mouth daily. 30 tablet 0  . simvastatin (ZOCOR) 40 MG tablet Take 1 tablet (40 mg total) by mouth at bedtime. 90 tablet 1  . Zinc 100 MG TABS Take 1 tablet by mouth daily.     No current facility-administered medications on file prior to visit.     PAST MEDICAL HISTORY: Past Medical History:  Diagnosis Date  . Asthma   . Back pain   . CAD (coronary artery disease)    Mild  . Diabetes mellitus   . Heart  valve problem   . HTN (hypertension)   . Hyperlipidemia   . Joint pain   . OSA (obstructive sleep apnea) 08/09/2017  . Palpitations   . SOB (shortness of breath)   . Subluxation    L2-3-4    PAST SURGICAL HISTORY: Past Surgical History:  Procedure Laterality Date  . APPENDECTOMY    . CARDIAC CATHETERIZATION     We recently performed a which revealed minor coronary artery irregularities. He had normal left ventricle systolic function with an EF of 60%  . CARDIOVASCULAR STRESS TEST     EF 63%  . TONSILLECTOMY    . TUMOR REMOVAL     LEFT ARM    SOCIAL HISTORY: Social History   Tobacco Use  . Smoking status: Former Smoker    Years: 7.00    Types: Pipe    Last  attempt to quit: 1980    Years since quitting: 40.0  . Smokeless tobacco: Never Used  Substance Use Topics  . Alcohol use: Yes    Comment: occassional  . Drug use: No    FAMILY HISTORY: Family History  Problem Relation Age of Onset  . Pneumonia Mother   . Osteoporosis Mother   . Arthritis Mother   . Diabetes Mother   . Hypertension Mother   . Cancer Father        leukemia, lung  . Cancer Brother   . Diabetes Maternal Aunt   . Hypertension Maternal Aunt   . Hypertension Maternal Aunt   . Lung cancer Unknown     ROS: Review of Systems  Constitutional: Positive for malaise/fatigue and weight loss.  Gastrointestinal: Negative for nausea and vomiting.  Musculoskeletal:       Negative muscle weakness  Endo/Heme/Allergies:       Negative hypoglycemia    PHYSICAL EXAM: Blood pressure 113/68, pulse 77, temperature 98 F (36.7 C), temperature source Oral, height 5\' 9"  (1.753 m), weight 284 lb (128.8 kg), SpO2 98 %. Body mass index is 41.94 kg/m. Physical Exam Vitals signs reviewed.  Constitutional:      Appearance: Normal appearance. He is obese.  Cardiovascular:     Rate and Rhythm: Normal rate.     Pulses: Normal pulses.  Pulmonary:     Effort: Pulmonary effort is normal.     Breath sounds:  Normal breath sounds.  Musculoskeletal: Normal range of motion.  Skin:    General: Skin is warm and dry.  Neurological:     Mental Status: He is alert and oriented to person, place, and time.  Psychiatric:        Mood and Affect: Mood normal.        Behavior: Behavior normal.     RECENT LABS AND TESTS: BMET    Component Value Date/Time   NA 139 01/24/2018 1307   K 4.1 01/24/2018 1307   CL 102 01/24/2018 1307   CO2 23 01/24/2018 1307   GLUCOSE 108 (H) 01/24/2018 1307   GLUCOSE 117 (H) 11/13/2017 0938   BUN 13 01/24/2018 1307   CREATININE 0.74 (L) 01/24/2018 1307   CALCIUM 9.3 01/24/2018 1307   GFRNONAA 95 01/24/2018 1307   GFRAA 110 01/24/2018 1307   Lab Results  Component Value Date   HGBA1C 6.9 (H) 11/13/2017   HGBA1C 6.9 (H) 08/01/2017   HGBA1C 7.5 (H) 05/08/2017   HGBA1C 6.7 (H) 11/07/2016   HGBA1C 7.2 (H) 08/17/2016   Lab Results  Component Value Date   INSULIN 7.9 01/24/2018   CBC    Component Value Date/Time   WBC 7.4 01/24/2018 1307   WBC 5.4 05/08/2017 1032   RBC 4.45 01/24/2018 1307   RBC 4.31 05/08/2017 1032   HGB 13.0 01/24/2018 1307   HCT 38.1 01/24/2018 1307   PLT 265 01/24/2018 1307   MCV 86 01/24/2018 1307   MCH 29.2 01/24/2018 1307   MCHC 34.1 01/24/2018 1307   MCHC 33.3 05/08/2017 1032   RDW 12.8 01/24/2018 1307   LYMPHSABS 1.2 01/24/2018 1307   MONOABS 0.5 05/08/2017 1032   EOSABS 0.4 01/24/2018 1307   BASOSABS 0.1 01/24/2018 1307   Iron/TIBC/Ferritin/ %Sat    Component Value Date/Time   IRON 102 05/08/2017 1032   IRON 102 05/08/2017 1032   FERRITIN 64.0 05/08/2017 1032   IRONPCTSAT 19.9 (L) 05/08/2017 1032   Lipid Panel     Component Value Date/Time  CHOL 217 (H) 01/24/2018 1307   TRIG 128 01/24/2018 1307   HDL 56 01/24/2018 1307   CHOLHDL 3.9 01/24/2018 1307   CHOLHDL 2 11/13/2017 0938   VLDL 11.4 11/13/2017 0938   LDLCALC 135 (H) 01/24/2018 1307   LDLDIRECT 198.6 03/20/2009 1127   Hepatic Function Panel       Component Value Date/Time   PROT 6.7 01/24/2018 1307   ALBUMIN 4.4 01/24/2018 1307   AST 15 01/24/2018 1307   ALT 13 01/24/2018 1307   ALKPHOS 48 01/24/2018 1307   BILITOT 0.4 01/24/2018 1307   BILIDIR 0.1 08/14/2014 0824      Component Value Date/Time   TSH 1.050 01/24/2018 1307   TSH 1.28 11/07/2016 1008   TSH 0.98 09/27/2011 0945      OBESITY BEHAVIORAL INTERVENTION VISIT  Today's visit was # 4   Starting weight: 301 lbs Starting date: 01/24/18 Today's weight : 284 lbs Today's date: 03/12/2018 Total lbs lost to date: 17 At least 15 minutes were spent on discussing the following behavioral intervention visit.   ASK: We discussed the diagnosis of obesity with Zachary Michael today and Zachary Michael agreed to give [name] permission to discuss obesity behavioral modification therapy today.  ASSESS: Zachary Michael has the diagnosis of obesity and his BMI today is 41.92 Zachary Michael is in the action stage of change   ADVISE: Zachary Michael was educated on the multiple health risks of obesity as well as the benefit of weight loss to improve his health. He was advised of the need for long term treatment and the importance of lifestyle modifications to improve his current health and to decrease his risk of future health problems.  AGREE: Multiple dietary modification options and treatment options were discussed and  Zachary Michael agreed to follow the recommendations documented in the above note.  ARRANGE: Zachary Michael was educated on the importance of frequent visits to treat obesity as outlined per CMS and USPSTF guidelines and agreed to schedule his next follow up appointment today.  I, Burt Knack, am acting as transcriptionist for Debbra Riding, MD  I have reviewed the above documentation for accuracy and completeness, and I agree with the above. - Debbra Riding, MD

## 2018-04-02 ENCOUNTER — Encounter (INDEPENDENT_AMBULATORY_CARE_PROVIDER_SITE_OTHER): Payer: Self-pay | Admitting: Family Medicine

## 2018-04-02 ENCOUNTER — Ambulatory Visit (INDEPENDENT_AMBULATORY_CARE_PROVIDER_SITE_OTHER): Payer: Medicare Other | Admitting: Family Medicine

## 2018-04-02 VITALS — BP 103/61 | HR 70 | Temp 98.3°F | Ht 69.0 in | Wt 275.0 lb

## 2018-04-02 DIAGNOSIS — E1165 Type 2 diabetes mellitus with hyperglycemia: Secondary | ICD-10-CM | POA: Diagnosis not present

## 2018-04-02 DIAGNOSIS — Z6841 Body Mass Index (BMI) 40.0 and over, adult: Secondary | ICD-10-CM

## 2018-04-02 DIAGNOSIS — E559 Vitamin D deficiency, unspecified: Secondary | ICD-10-CM | POA: Diagnosis not present

## 2018-04-02 DIAGNOSIS — Z794 Long term (current) use of insulin: Secondary | ICD-10-CM

## 2018-04-02 DIAGNOSIS — I10 Essential (primary) hypertension: Secondary | ICD-10-CM

## 2018-04-02 MED ORDER — VITAMIN D (ERGOCALCIFEROL) 1.25 MG (50000 UNIT) PO CAPS: 50000.0000 [IU] | ORAL_CAPSULE | ORAL | 0 refills | Status: DC

## 2018-04-02 NOTE — Progress Notes (Signed)
Office: [phone]  /  Fax: [phone]   HPI:   Chief Complaint: OBESITY Zachary Michael is here to discuss his progress with his obesity treatment plan. He is on the keep a food journal with 450-600 calories and 40+ grams of protein at supper daily and follow the Category 3 plan and is following his eating plan approximately 98 % of the time. He states he is walking for 30-45 minutes 7 times per week. Zachary Michael reports doing well logging food and exercise. He is getting around 1650 to 1800 calories daily. He is exceeding protein content everyday.  His weight is 275 lb (124.7 kg) today and has had a weight loss of 9 pounds over a period of 3 weeks since his last visit. He has lost 26 lbs since starting treatment with [name].  Diabetes II with Hyperglycemia Zachary Michael has a diagnosis of diabetes type II. Zachary Michael states fasting BGs range between 88 and 101, he notes 1 episode of hypoglycemia in 60's. Last A1c was 6.9. He has been working on intensive lifestyle modifications including diet, exercise, and weight loss to help control his blood glucose levels.  Hypertension Zachary Michael is a 69 y.o. male with hypertension. Zachary Michael's blood pressure is low, but controlled. He notes occasional dizziness, but he is correlating this to low BGs. He is working on weight loss to help control his blood pressure with the goal of decreasing his risk of heart attack and stroke.   Vitamin D Deficiency Zachary Michael has a diagnosis of vitamin D deficiency. He is currently taking prescription Vit D. He notes fatigue and denies nausea, vomiting or muscle weakness.  ASSESSMENT AND PLAN:  Type 2 diabetes mellitus with hyperglycemia, with long-term current use of insulin (HCC)  Essential hypertension  Vitamin D deficiency - Plan: Vitamin D, Ergocalciferol, (DRISDOL) 1.25 MG (50000 UT) CAPS capsule  Class 3 severe obesity with serious comorbidity and body mass index (BMI) of 40.0 to 44.9 in adult, unspecified obesity type  (HCC)  PLAN:  Diabetes II with Hyperglycemia Zachary Michael has been given extensive diabetes education by myself today including ideal fasting and post-prandial blood glucose readings, individual ideal Hgb A1c goals and hypoglycemia prevention. We discussed the importance of good blood sugar control to decrease the likelihood of diabetic complications such as nephropathy, neuropathy, limb loss, blindness, coronary artery disease, and death. We discussed the importance of intensive lifestyle modification including diet, exercise and weight loss as the first line treatment for diabetes. Zachary Michael agrees to stop glimepiride, if experiencing low BGs intermittently by consistent in the next week cut Actos in half. Zachary Michael agrees to follow up with our clinic in 2 weeks.  Hypertension We discussed sodium restriction, working on healthy weight loss, and a regular exercise program as the means to achieve improved blood pressure control. Zachary Michael agreed with this plan and agreed to follow up as directed. We will continue to monitor his blood pressure as well as his progress with the above lifestyle modifications. Zachary Michael will continue his medications and will watch for signs of hypotension as he continues his lifestyle modifications. We will follow up on blood pressure at next appointment. Zachary Michael agrees to follow up with our clinic in 2 weeks.  Vitamin D Deficiency Zachary Michael was informed that low vitamin D levels contributes to fatigue and are associated with obesity, breast, and colon cancer. Zachary Michael agrees to continue taking prescription Vit D @50 ,000 IU every week #4 and we will refill for 1 month. He will follow up for routine testing of vitamin D,  at least 2-3 times per year. He was informed of the risk of over-replacement of vitamin D and agrees to not increase his dose unless he discusses this with [name] first. Zachary Michael agrees to follow up with our clinic in 2 weeks.  Obesity Zachary Michael is currently in the action stage of  change. As such, his goal is to continue with weight loss efforts He has agreed to keep a food journal with 1450-1600 calories and 90+ grams of protein daily Zachary Michael has been instructed to work up to a goal of 150 minutes of combined cardio and strengthening exercise per week or start upper body exercises for 10-15 minutes 2-3 times per week for weight loss and overall health benefits. We discussed the following Behavioral Modification Strategies today: increasing lean protein intake, increasing vegetables, work on meal planning and easy cooking plans, better snacking choices, and planning for success   Zachary Michael has agreed to follow up with our clinic in 2 weeks. He was informed of the importance of frequent follow up visits to maximize his success with intensive lifestyle modifications for his multiple health conditions.  ALLERGIES: No Known Allergies  MEDICATIONS: Current Outpatient Medications on File Prior to Visit  Medication Sig Dispense Refill  . albuterol (PROAIR HFA) 108 (90 BASE) MCG/ACT inhaler Inhale 2 puffs into the lungs every 6 (six) hours as needed for wheezing or shortness of breath. 1 Inhaler 4  . aspirin 81 MG tablet Take 81 mg by mouth daily.      [name] b complex vitamins tablet Take 1 tablet by mouth daily.    . calcium carbonate (CALCIUM 600) 600 MG TABS tablet Take 600 mg by mouth daily with breakfast.    . carvedilol (COREG) 6.25 MG tablet Take 1 tablet (6.25 mg total) by mouth 2 (two) times daily with a meal. 180 tablet 1  . Cinnamon 500 MG capsule Take 500 mg by mouth daily.    [name] co-enzyme Q-10 30 MG capsule Take 30 mg by mouth 3 (three) times daily.    . fenofibrate 160 MG tablet Take 1 tablet (160 mg total) by mouth daily. 90 tablet 1  . ferrous sulfate 325 (65 FE) MG EC tablet Take 325 mg by mouth 3 (three) times daily with meals.    [name] glimepiride (AMARYL) 2 MG tablet Take 1 tablet (2 mg total) by mouth daily before breakfast. 30 tablet 0  . glucose blood (ONETOUCH  VERIO) test strip Use as instructed 100 each 0  . lisinopril (PRINIVIL,ZESTRIL) 10 MG tablet Take 1 tablet (10 mg total) by mouth daily. 90 tablet 1  . metFORMIN (GLUCOPHAGE) 1000 MG tablet Take 1 tablet (1,000 mg total) by mouth 2 (two) times daily with a meal. 180 tablet 1  . Multiple Vitamin (MULTIVITAMIN) capsule Take 1 capsule by mouth daily.    . nitroGLYCERIN (NITROSTAT) 0.4 MG SL tablet Place 1 tablet (0.4 mg total) under the tongue every 5 (five) minutes as needed for chest pain. 25 tablet 5  . pioglitazone (ACTOS) 45 MG tablet Take 1 tablet (45 mg total) by mouth daily. 30 tablet 0  . simvastatin (ZOCOR) 40 MG tablet Take 1 tablet (40 mg total) by mouth at bedtime. 90 tablet 1  . Zinc 100 MG TABS Take 1 tablet by mouth daily.     No current facility-administered medications on file prior to visit.     PAST MEDICAL HISTORY: Past Medical History:  Diagnosis Date  . Asthma   . Back pain   . CAD (  coronary artery disease)    Mild  . Diabetes mellitus   . Heart valve problem   . HTN (hypertension)   . Hyperlipidemia   . Joint pain   . OSA (obstructive sleep apnea) 08/09/2017  . Palpitations   . SOB (shortness of breath)   . Subluxation    L2-3-4    PAST SURGICAL HISTORY: Past Surgical History:  Procedure Laterality Date  . APPENDECTOMY    . CARDIAC CATHETERIZATION     We recently performed a which revealed minor coronary artery irregularities. He had normal left ventricle systolic function with an EF of 60%  . CARDIOVASCULAR STRESS TEST     EF 63%  . TONSILLECTOMY    . TUMOR REMOVAL     LEFT ARM    SOCIAL HISTORY: Social History   Tobacco Use  . Smoking status: Former Smoker    Years: 7.00    Types: Pipe    Last attempt to quit: 1980    Years since quitting: 40.1  . Smokeless tobacco: Never Used  Substance Use Topics  . Alcohol use: Yes    Comment: occassional  . Drug use: No    FAMILY HISTORY: Family History  Problem Relation Age of Onset  .  Pneumonia Mother   . Osteoporosis Mother   . Arthritis Mother   . Diabetes Mother   . Hypertension Mother   . Cancer Father        leukemia, lung  . Cancer Brother   . Diabetes Maternal Aunt   . Hypertension Maternal Aunt   . Hypertension Maternal Aunt   . Lung cancer Unknown     ROS: Review of Systems  Constitutional: Positive for malaise/fatigue and weight loss.  Gastrointestinal: Negative for nausea and vomiting.  Musculoskeletal:       Negative muscle weakness  Neurological: Positive for dizziness.  Endo/Heme/Allergies:       Negative hypoglycemia    PHYSICAL EXAM: Blood pressure 103/61, pulse 70, temperature 98.3 F (36.8 C), temperature source Oral, height 5\' 9"  (1.753 m), weight 275 lb (124.7 kg), SpO2 98 %. Body mass index is 40.61 kg/m. Physical Exam Vitals signs reviewed.  Constitutional:      Appearance: Normal appearance. He is obese.  Cardiovascular:     Rate and Rhythm: Normal rate.     Pulses: Normal pulses.  Pulmonary:     Effort: Pulmonary effort is normal.     Breath sounds: Normal breath sounds.  Musculoskeletal: Normal range of motion.  Skin:    General: Skin is warm and dry.  Neurological:     Mental Status: He is alert and oriented to person, place, and time.  Psychiatric:        Mood and Affect: Mood normal.        Behavior: Behavior normal.     RECENT LABS AND TESTS: BMET    Component Value Date/Time   NA 139 01/24/2018 1307   K 4.1 01/24/2018 1307   CL 102 01/24/2018 1307   CO2 23 01/24/2018 1307   GLUCOSE 108 (H) 01/24/2018 1307   GLUCOSE 117 (H) 11/13/2017 0938   BUN 13 01/24/2018 1307   CREATININE 0.74 (L) 01/24/2018 1307   CALCIUM 9.3 01/24/2018 1307   GFRNONAA 95 01/24/2018 1307   GFRAA 110 01/24/2018 1307   Lab Results  Component Value Date   HGBA1C 6.9 (H) 11/13/2017   HGBA1C 6.9 (H) 08/01/2017   HGBA1C 7.5 (H) 05/08/2017   HGBA1C 6.7 (H) 11/07/2016   HGBA1C 7.2 (H)  08/17/2016   Lab Results  Component Value  Date   INSULIN 7.9 01/24/2018   CBC    Component Value Date/Time   WBC 7.4 01/24/2018 1307   WBC 5.4 05/08/2017 1032   RBC 4.45 01/24/2018 1307   RBC 4.31 05/08/2017 1032   HGB 13.0 01/24/2018 1307   HCT 38.1 01/24/2018 1307   PLT 265 01/24/2018 1307   MCV 86 01/24/2018 1307   MCH 29.2 01/24/2018 1307   MCHC 34.1 01/24/2018 1307   MCHC 33.3 05/08/2017 1032   RDW 12.8 01/24/2018 1307   LYMPHSABS 1.2 01/24/2018 1307   MONOABS 0.5 05/08/2017 1032   EOSABS 0.4 01/24/2018 1307   BASOSABS 0.1 01/24/2018 1307   Iron/TIBC/Ferritin/ %Sat    Component Value Date/Time   IRON 102 05/08/2017 1032   IRON 102 05/08/2017 1032   FERRITIN 64.0 05/08/2017 1032   IRONPCTSAT 19.9 (L) 05/08/2017 1032   Lipid Panel     Component Value Date/Time   CHOL 217 (H) 01/24/2018 1307   TRIG 128 01/24/2018 1307   HDL 56 01/24/2018 1307   CHOLHDL 3.9 01/24/2018 1307   CHOLHDL 2 11/13/2017 0938   VLDL 11.4 11/13/2017 0938   LDLCALC 135 (H) 01/24/2018 1307   LDLDIRECT 198.6 03/20/2009 1127   Hepatic Function Panel     Component Value Date/Time   PROT 6.7 01/24/2018 1307   ALBUMIN 4.4 01/24/2018 1307   AST 15 01/24/2018 1307   ALT 13 01/24/2018 1307   ALKPHOS 48 01/24/2018 1307   BILITOT 0.4 01/24/2018 1307   BILIDIR 0.1 08/14/2014 0824      Component Value Date/Time   TSH 1.050 01/24/2018 1307   TSH 1.28 11/07/2016 1008   TSH 0.98 09/27/2011 0945      OBESITY BEHAVIORAL INTERVENTION VISIT  Today's visit was # 5   Starting weight: 301 lbs Starting date: 01/24/18 Today's weight : 275 lbs Today's date: 04/02/2018 Total lbs lost to date: 26 At least 15 minutes were spent on discussing the following behavioral intervention visit.   ASK: We discussed the diagnosis of obesity with Zachary Michael today and Zachary Michael agreed to give us permission to discuss obesity behavioral modification therapy today.  ASSESS: Zachary Michael has the diagnosis of obesity and his BMI today is 40.59 Zachary Michael is  in the action stage of change   ADVISE: Zachary Michael was educated on the multiple health risks of obesity as well as the benefit of weight loss to improve his health. He was advised of the need for long term treatment and the importance of lifestyle modifications to improve his current health and to decrease his risk of future health problems.  AGREE: Multiple dietary modification options and treatment options were discussed and  Zachary Michael agreed to follow the recommendations documented in the above note.  ARRANGE: Zachary Michael was educated on the importance of frequent visits to treat obesity as outlined per CMS and USPSTF guidelines and agreed to schedule his next follow up appointment today.  I, Burt KnackSharon Martin, am acting as transcriptionist for Debbra RidingAlexandria Kadolph, MD  I have reviewed the above documentation for accuracy and completeness, and I agree with the above. - Debbra RidingAlexandria Kadolph, MD

## 2018-04-16 ENCOUNTER — Ambulatory Visit (INDEPENDENT_AMBULATORY_CARE_PROVIDER_SITE_OTHER): Payer: Medicare Other | Admitting: Family Medicine

## 2018-04-16 ENCOUNTER — Encounter (INDEPENDENT_AMBULATORY_CARE_PROVIDER_SITE_OTHER): Payer: Self-pay | Admitting: Family Medicine

## 2018-04-16 VITALS — BP 108/69 | HR 65 | Temp 98.4°F | Ht 69.0 in | Wt 274.0 lb

## 2018-04-16 DIAGNOSIS — E1165 Type 2 diabetes mellitus with hyperglycemia: Secondary | ICD-10-CM

## 2018-04-16 DIAGNOSIS — Z6841 Body Mass Index (BMI) 40.0 and over, adult: Secondary | ICD-10-CM | POA: Diagnosis not present

## 2018-04-16 DIAGNOSIS — E559 Vitamin D deficiency, unspecified: Secondary | ICD-10-CM | POA: Diagnosis not present

## 2018-04-16 MED ORDER — PIOGLITAZONE HCL 45 MG PO TABS: 45.0000 mg | ORAL_TABLET | Freq: Every day | ORAL | 0 refills | Status: DC

## 2018-04-16 MED ORDER — VITAMIN D (ERGOCALCIFEROL) 1.25 MG (50000 UNIT) PO CAPS: 50000.0000 [IU] | ORAL_CAPSULE | ORAL | 0 refills | Status: DC

## 2018-04-16 NOTE — Progress Notes (Signed)
Office: [phone]  /  Fax: [phone]   HPI:   Chief Complaint: OBESITY Zachary Michael is here to discuss his progress with his obesity treatment plan. He is on the keep a food journal with 450-600 calories and 40+ grams of protein at supper daily and follow the Category 3 plan and is following his eating plan approximately 90 % of the time. He states he is walking for 30 minutes 5-7 times per week. Zachary Michael is enjoying journaling because he can manage his day in terms of protein and calories. He is hitting protein goal daily (but only reaching 80-85 grams daily).  His weight is 274 lb (124.3 kg) today and has had a weight loss of 1 pound over a period of 2 weeks since his last visit. He has lost 27 lbs since starting treatment with [name].  Diabetes II with Hyperglycemia Zachary Michael has a diagnosis of diabetes type II. Zachary Michael stopped glimepiride and his fasting BGs went up to 140's, but has gone back down to 120's. He denies hypoglycemia. He is on ACE, metformin, and statin. Last A1c was 6.9. on 11/13/17. He has been working on intensive lifestyle modifications including diet, exercise, and weight loss to help control his blood glucose levels.  Vitamin D Deficiency Zachary Michael has a diagnosis of vitamin D deficiency. He is currently taking prescription Vit D. He notes fatigue and denies nausea, vomiting or muscle weakness.  ASSESSMENT AND PLAN:  Type 2 diabetes mellitus with hyperglycemia, without long-term current use of insulin (HCC) - Plan: pioglitazone (ACTOS) 45 MG tablet  Vitamin D deficiency - Plan: Vitamin D, Ergocalciferol, (DRISDOL) 1.25 MG (50000 UT) CAPS capsule  Class 3 severe obesity with serious comorbidity and body mass index (BMI) of 40.0 to 44.9 in adult, unspecified obesity type (HCC)  PLAN:  Diabetes II Zachary Michael has been given extensive diabetes education by myself today including ideal fasting and post-prandial blood glucose readings, individual ideal Hgb A1c goals and  hypoglycemia prevention. We discussed the importance of good blood sugar control to decrease the likelihood of diabetic complications such as nephropathy, neuropathy, limb loss, blindness, coronary artery disease, and death. We discussed the importance of intensive lifestyle modification including diet, exercise and weight loss as the first line treatment for diabetes. Zachary Michael agrees to continue Actos 45 mg tablet PO daily #30 and we will refill for 1 month. We will recheck labs at next visit. Zachary Michael agrees to follow up with our clinic in 3 weeks.  Vitamin D Deficiency Zachary Michael was informed that low vitamin D levels contributes to fatigue and are associated with obesity, breast, and colon cancer. Zachary Michael agrees to continue taking prescription Vit D @50 ,000 IU every week #4 and we will refill for 1 month. He will follow up for routine testing of vitamin D, at least 2-3 times per year. He was informed of the risk of over-replacement of vitamin D and agrees to not increase his dose unless he discusses this with [name] first. Zachary Michael agrees to follow up with our clinic in 3 weeks.  Obesity Zachary Michael is currently in the action stage of change. As such, his goal is to continue with weight loss efforts He has agreed to keep a food journal with 1450-1600 calories and 90+ grams of protein daily Zachary Michael has been instructed to work up to a goal of 150 minutes of combined cardio and strengthening exercise per week for weight loss and overall health benefits. We discussed the following Behavioral Modification Strategies today: increasing lean protein intake, increasing vegetables and work  on meal planning and easy cooking plans, and planning for success   Zachary Michael has agreed to follow up with our clinic in 3 weeks. He was informed of the importance of frequent follow up visits to maximize his success with intensive lifestyle modifications for his multiple health conditions.  ALLERGIES: No Known  Allergies  MEDICATIONS: Current Outpatient Medications on File Prior to Visit  Medication Sig Dispense Refill  . albuterol (PROAIR HFA) 108 (90 BASE) MCG/ACT inhaler Inhale 2 puffs into the lungs every 6 (six) hours as needed for wheezing or shortness of breath. 1 Inhaler 4  . aspirin 81 MG tablet Take 81 mg by mouth daily.      [name] b complex vitamins tablet Take 1 tablet by mouth daily.    . calcium carbonate (CALCIUM 600) 600 MG TABS tablet Take 600 mg by mouth daily with breakfast.    . carvedilol (COREG) 6.25 MG tablet Take 1 tablet (6.25 mg total) by mouth 2 (two) times daily with a meal. 180 tablet 1  . Cinnamon 500 MG capsule Take 500 mg by mouth daily.    [name] co-enzyme Q-10 30 MG capsule Take 30 mg by mouth 3 (three) times daily.    . fenofibrate 160 MG tablet Take 1 tablet (160 mg total) by mouth daily. 90 tablet 1  . ferrous sulfate 325 (65 FE) MG EC tablet Take 325 mg by mouth 3 (three) times daily with meals.    [name] glimepiride (AMARYL) 2 MG tablet Take 1 tablet (2 mg total) by mouth daily before breakfast. 30 tablet 0  . glucose blood (ONETOUCH VERIO) test strip Use as instructed 100 each 0  . lisinopril (PRINIVIL,ZESTRIL) 10 MG tablet Take 1 tablet (10 mg total) by mouth daily. 90 tablet 1  . metFORMIN (GLUCOPHAGE) 1000 MG tablet Take 1 tablet (1,000 mg total) by mouth 2 (two) times daily with a meal. 180 tablet 1  . Multiple Vitamin (MULTIVITAMIN) capsule Take 1 capsule by mouth daily.    . nitroGLYCERIN (NITROSTAT) 0.4 MG SL tablet Place 1 tablet (0.4 mg total) under the tongue every 5 (five) minutes as needed for chest pain. 25 tablet 5  . simvastatin (ZOCOR) 40 MG tablet Take 1 tablet (40 mg total) by mouth at bedtime. 90 tablet 1  . Zinc 100 MG TABS Take 1 tablet by mouth daily.     No current facility-administered medications on file prior to visit.     PAST MEDICAL HISTORY: Past Medical History:  Diagnosis Date  . Asthma   . Back pain   . CAD (coronary artery disease)     Mild  . Diabetes mellitus   . Heart valve problem   . HTN (hypertension)   . Hyperlipidemia   . Joint pain   . OSA (obstructive sleep apnea) 08/09/2017  . Palpitations   . SOB (shortness of breath)   . Subluxation    L2-3-4    PAST SURGICAL HISTORY: Past Surgical History:  Procedure Laterality Date  . APPENDECTOMY    . CARDIAC CATHETERIZATION     We recently performed a which revealed minor coronary artery irregularities. He had normal left ventricle systolic function with an EF of 60%  . CARDIOVASCULAR STRESS TEST     EF 63%  . TONSILLECTOMY    . TUMOR REMOVAL     LEFT ARM    SOCIAL HISTORY: Social History   Tobacco Use  . Smoking status: Former Smoker    Years: 7.00    Types: Pipe  Last attempt to quit: 1980    Years since quitting: 40.1  . Smokeless tobacco: Never Used  Substance Use Topics  . Alcohol use: Yes    Comment: occassional  . Drug use: No    FAMILY HISTORY: Family History  Problem Relation Age of Onset  . Pneumonia Mother   . Osteoporosis Mother   . Arthritis Mother   . Diabetes Mother   . Hypertension Mother   . Cancer Father        leukemia, lung  . Cancer Brother   . Diabetes Maternal Aunt   . Hypertension Maternal Aunt   . Hypertension Maternal Aunt   . Lung cancer Unknown     ROS: Review of Systems  Constitutional: Positive for malaise/fatigue and weight loss.  Gastrointestinal: Negative for nausea and vomiting.  Musculoskeletal:       Negative muscle weakness  Endo/Heme/Allergies:       Negative hypoglycemia    PHYSICAL EXAM: Blood pressure 108/69, pulse 65, temperature 98.4 F (36.9 C), temperature source Oral, height 5\' 9"  (1.753 m), weight 274 lb (124.3 kg), SpO2 96 %. Body mass index is 40.46 kg/m. Physical Exam Vitals signs reviewed.  Constitutional:      Appearance: Normal appearance. He is obese.  Cardiovascular:     Rate and Rhythm: Normal rate.     Pulses: Normal pulses.  Pulmonary:     Effort:  Pulmonary effort is normal.     Breath sounds: Normal breath sounds.  Musculoskeletal: Normal range of motion.  Skin:    General: Skin is warm and dry.  Neurological:     Mental Status: He is alert and oriented to person, place, and time.  Psychiatric:        Mood and Affect: Mood normal.        Behavior: Behavior normal.     RECENT LABS AND TESTS: BMET    Component Value Date/Time   NA 139 01/24/2018 1307   K 4.1 01/24/2018 1307   CL 102 01/24/2018 1307   CO2 23 01/24/2018 1307   GLUCOSE 108 (H) 01/24/2018 1307   GLUCOSE 117 (H) 11/13/2017 0938   BUN 13 01/24/2018 1307   CREATININE 0.74 (L) 01/24/2018 1307   CALCIUM 9.3 01/24/2018 1307   GFRNONAA 95 01/24/2018 1307   GFRAA 110 01/24/2018 1307   Lab Results  Component Value Date   HGBA1C 6.9 (H) 11/13/2017   HGBA1C 6.9 (H) 08/01/2017   HGBA1C 7.5 (H) 05/08/2017   HGBA1C 6.7 (H) 11/07/2016   HGBA1C 7.2 (H) 08/17/2016   Lab Results  Component Value Date   INSULIN 7.9 01/24/2018   CBC    Component Value Date/Time   WBC 7.4 01/24/2018 1307   WBC 5.4 05/08/2017 1032   RBC 4.45 01/24/2018 1307   RBC 4.31 05/08/2017 1032   HGB 13.0 01/24/2018 1307   HCT 38.1 01/24/2018 1307   PLT 265 01/24/2018 1307   MCV 86 01/24/2018 1307   MCH 29.2 01/24/2018 1307   MCHC 34.1 01/24/2018 1307   MCHC 33.3 05/08/2017 1032   RDW 12.8 01/24/2018 1307   LYMPHSABS 1.2 01/24/2018 1307   MONOABS 0.5 05/08/2017 1032   EOSABS 0.4 01/24/2018 1307   BASOSABS 0.1 01/24/2018 1307   Iron/TIBC/Ferritin/ %Sat    Component Value Date/Time   IRON 102 05/08/2017 1032   IRON 102 05/08/2017 1032   FERRITIN 64.0 05/08/2017 1032   IRONPCTSAT 19.9 (L) 05/08/2017 1032   Lipid Panel     Component Value Date/Time  CHOL 217 (H) 01/24/2018 1307   TRIG 128 01/24/2018 1307   HDL 56 01/24/2018 1307   CHOLHDL 3.9 01/24/2018 1307   CHOLHDL 2 11/13/2017 0938   VLDL 11.4 11/13/2017 0938   LDLCALC 135 (H) 01/24/2018 1307   LDLDIRECT 198.6  03/20/2009 1127   Hepatic Function Panel     Component Value Date/Time   PROT 6.7 01/24/2018 1307   ALBUMIN 4.4 01/24/2018 1307   AST 15 01/24/2018 1307   ALT 13 01/24/2018 1307   ALKPHOS 48 01/24/2018 1307   BILITOT 0.4 01/24/2018 1307   BILIDIR 0.1 08/14/2014 0824      Component Value Date/Time   TSH 1.050 01/24/2018 1307   TSH 1.28 11/07/2016 1008   TSH 0.98 09/27/2011 0945      OBESITY BEHAVIORAL INTERVENTION VISIT  Today's visit was # 6   Starting weight: 301 lbs Starting date: 01/24/18 Today's weight : 274 lbs  Today's date: 04/16/2018 Total lbs lost to date: 27 At least 15 minutes were spent on discussing the following behavioral intervention visit.   ASK: We discussed the diagnosis of obesity with [name] today and Jaimere agreed to give [name] permission to discuss obesity behavioral modification therapy today.  ASSESS: Dene has the diagnosis of obesity and his BMI today is 40.44 Domingo is in the action stage of change   ADVISE: Chino was educated on the multiple health risks of obesity as well as the benefit of weight loss to improve his health. He was advised of the need for long term treatment and the importance of lifestyle modifications to improve his current health and to decrease his risk of future health problems.  AGREE: Multiple dietary modification options and treatment options were discussed and  Ashwin agreed to follow the recommendations documented in the above note.  ARRANGE: Areli was educated on the importance of frequent visits to treat obesity as outlined per CMS and USPSTF guidelines and agreed to schedule his next follow up appointment today.  I, Burt Knack, am acting as transcriptionist for Debbra Riding, MD  I have reviewed the above documentation for accuracy and completeness, and I agree with the above. - Debbra Riding, MD

## 2018-05-07 ENCOUNTER — Ambulatory Visit (INDEPENDENT_AMBULATORY_CARE_PROVIDER_SITE_OTHER): Payer: Medicare Other | Admitting: Family Medicine

## 2018-05-07 ENCOUNTER — Encounter (INDEPENDENT_AMBULATORY_CARE_PROVIDER_SITE_OTHER): Payer: Self-pay | Admitting: Family Medicine

## 2018-05-07 VITALS — BP 106/66 | HR 63 | Temp 97.9°F | Ht 69.0 in | Wt 270.0 lb

## 2018-05-07 DIAGNOSIS — Z794 Long term (current) use of insulin: Secondary | ICD-10-CM | POA: Diagnosis not present

## 2018-05-07 DIAGNOSIS — Z6839 Body mass index (BMI) 39.0-39.9, adult: Secondary | ICD-10-CM

## 2018-05-07 DIAGNOSIS — E1165 Type 2 diabetes mellitus with hyperglycemia: Secondary | ICD-10-CM | POA: Diagnosis not present

## 2018-05-07 DIAGNOSIS — E559 Vitamin D deficiency, unspecified: Secondary | ICD-10-CM | POA: Diagnosis not present

## 2018-05-07 NOTE — Progress Notes (Signed)
Office: [phone]  /  Fax: [phone]   HPI:   Chief Complaint: OBESITY Zachary Michael is here to discuss his progress with his obesity treatment plan. He is on the keep a food journal with 1450-1600 calories and 90+ grams of protein daily and is following his eating plan approximately 80 % of the time. He states he is walking for 30 minutes 7 times per week. Zachary Michael has been keeping track on the website and logging food on excel. His calories are ranging between 1234 and 2226, and he is always achieving protein goal. He has had about 6 days of not getting in calories allotted.  His weight is 270 lb (122.5 kg) today and has had a weight loss of 4 pounds over a period of 3 weeks since his last visit. He has lost 31 lbs since starting treatment with [name].  Diabetes II with Hyperglycemia Zachary Michael has a diagnosis of diabetes type II. Zachary Michael states fasting BGs range between 130 and 140's and he denies hypoglycemic episodes. Last A1c was 6.9. He has been working on intensive lifestyle modifications including diet, exercise, and weight loss to help control his blood glucose levels.  Vitamin D Deficiency Zachary Michael has a diagnosis of vitamin D deficiency. He is currently taking prescription Vit D. He notes fatigue and denies nausea, vomiting or muscle weakness.  ASSESSMENT AND PLAN:  Type 2 diabetes mellitus with hyperglycemia, with long-term current use of insulin (HCC) - Plan: Comprehensive metabolic panel, Hemoglobin A1c, Insulin, random, Lipid Panel With LDL/HDL Ratio  Vitamin D deficiency - Plan: VITAMIN D 25 Hydroxy (Vit-D Deficiency, Fractures)  Class 2 severe obesity with serious comorbidity and body mass index (BMI) of 39.0 to 39.9 in adult, unspecified obesity type (HCC)  PLAN:  Diabetes II with Hyperglycemia Zachary Michael has been given extensive diabetes education by myself today including ideal fasting and post-prandial blood glucose readings, individual ideal Hgb A1c goals and hypoglycemia  prevention. We discussed the importance of good blood sugar control to decrease the likelihood of diabetic complications such as nephropathy, neuropathy, limb loss, blindness, coronary artery disease, and death. We discussed the importance of intensive lifestyle modification including diet, exercise and weight loss as the first line treatment for diabetes. Zachary Michael agrees to continue his diabetes medications and we will check Hgb A1c, insulin, and FLP today. Zachary Michael agrees to follow up with our clinic in 2 weeks.  Vitamin D Deficiency Zachary Michael was informed that low vitamin D levels contributes to fatigue and are associated with obesity, breast, and colon cancer. Zachary Michael agrees to continue taking prescription Vit D @50 ,000 IU every week and will follow up for routine testing of vitamin D, at least 2-3 times per year. He was informed of the risk of over-replacement of vitamin D and agrees to not increase his dose unless he discusses this with [name] first. We will check Vit D level today. Zachary Michael agrees to follow up with our clinic in 2 weeks.  Obesity Zachary Michael is currently in the action stage of change. As such, his goal is to continue with weight loss efforts He has agreed to keep a food journal with 1450-1600 calories and 90+ grams of protein daily Zachary Michael has been instructed to work up to a goal of 150 minutes of combined cardio and strengthening exercise per week for weight loss and overall health benefits. Zachary Michael is to start resistance training when upper respiratory infection resolves starting 1-2 times per week. We discussed the following Behavioral Modification Strategies today: increasing lean protein intake, increasing vegetables, no  skipping meals, work on meal planning and easy cooking plans, better snacking choices, and planning for success   Zachary Michael has agreed to follow up with our clinic in 2 weeks. He was informed of the importance of frequent follow up visits to maximize his success with  intensive lifestyle modifications for his multiple health conditions.  ALLERGIES: No Known Allergies  MEDICATIONS: Current Outpatient Medications on File Prior to Visit  Medication Sig Dispense Refill  . albuterol (PROAIR HFA) 108 (90 BASE) MCG/ACT inhaler Inhale 2 puffs into the lungs every 6 (six) hours as needed for wheezing or shortness of breath. 1 Inhaler 4  . aspirin 81 MG tablet Take 81 mg by mouth daily.      [name]. b complex vitamins tablet Take 1 tablet by mouth daily.    . calcium carbonate (CALCIUM 600) 600 MG TABS tablet Take 600 mg by mouth daily with breakfast.    . carvedilol (COREG) 6.25 MG tablet Take 1 tablet (6.25 mg total) by mouth 2 (two) times daily with a meal. 180 tablet 1  . Cinnamon 500 MG capsule Take 500 mg by mouth daily.    [name]. co-enzyme Q-10 30 MG capsule Take 30 mg by mouth 3 (three) times daily.    . fenofibrate 160 MG tablet Take 1 tablet (160 mg total) by mouth daily. 90 tablet 1  . ferrous sulfate 325 (65 FE) MG EC tablet Take 325 mg by mouth 3 (three) times daily with meals.    [name]. glimepiride (AMARYL) 2 MG tablet Take 1 tablet (2 mg total) by mouth daily before breakfast. 30 tablet 0  . glucose blood (ONETOUCH VERIO) test strip Use as instructed 100 each 0  . lisinopril (PRINIVIL,ZESTRIL) 10 MG tablet Take 1 tablet (10 mg total) by mouth daily. 90 tablet 1  . metFORMIN (GLUCOPHAGE) 1000 MG tablet Take 1 tablet (1,000 mg total) by mouth 2 (two) times daily with a meal. 180 tablet 1  . Multiple Vitamin (MULTIVITAMIN) capsule Take 1 capsule by mouth daily.    . nitroGLYCERIN (NITROSTAT) 0.4 MG SL tablet Place 1 tablet (0.4 mg total) under the tongue every 5 (five) minutes as needed for chest pain. 25 tablet 5  . pioglitazone (ACTOS) 45 MG tablet Take 1 tablet (45 mg total) by mouth daily. 30 tablet 0  . simvastatin (ZOCOR) 40 MG tablet Take 1 tablet (40 mg total) by mouth at bedtime. 90 tablet 1  . Vitamin D, Ergocalciferol, (DRISDOL) 1.25 MG (50000 UT) CAPS  capsule Take 1 capsule (50,000 Units total) by mouth every 7 (seven) days. 4 capsule 0  . Zinc 100 MG TABS Take 1 tablet by mouth daily.     No current facility-administered medications on file prior to visit.     PAST MEDICAL HISTORY: Past Medical History:  Diagnosis Date  . Asthma   . Back pain   . CAD (coronary artery disease)    Mild  . Diabetes mellitus   . Heart valve problem   . HTN (hypertension)   . Hyperlipidemia   . Joint pain   . OSA (obstructive sleep apnea) 08/09/2017  . Palpitations   . SOB (shortness of breath)   . Subluxation    L2-3-4    PAST SURGICAL HISTORY: Past Surgical History:  Procedure Laterality Date  . APPENDECTOMY    . CARDIAC CATHETERIZATION     We recently performed a which revealed minor coronary artery irregularities. He had normal left ventricle systolic function with an EF of 60%  .  CARDIOVASCULAR STRESS TEST     EF 63%  . TONSILLECTOMY    . TUMOR REMOVAL     LEFT ARM    SOCIAL HISTORY: Social History   Tobacco Use  . Smoking status: Former Smoker    Years: 7.00    Types: Pipe    Last attempt to quit: 1980    Years since quitting: 40.2  . Smokeless tobacco: Never Used  Substance Use Topics  . Alcohol use: Yes    Comment: occassional  . Drug use: No    FAMILY HISTORY: Family History  Problem Relation Age of Onset  . Pneumonia Mother   . Osteoporosis Mother   . Arthritis Mother   . Diabetes Mother   . Hypertension Mother   . Cancer Father        leukemia, lung  . Cancer Brother   . Diabetes Maternal Aunt   . Hypertension Maternal Aunt   . Hypertension Maternal Aunt   . Lung cancer Unknown     ROS: Review of Systems  Constitutional: Positive for malaise/fatigue and weight loss.  Gastrointestinal: Negative for nausea and vomiting.  Musculoskeletal:       Negative muscle weakness  Endo/Heme/Allergies:       Negative hypoglycemia    PHYSICAL EXAM: Blood pressure 106/66, pulse 63, temperature 97.9 F (36.6  C), temperature source Oral, height 5\' 9"  (1.753 m), weight 270 lb (122.5 kg), SpO2 98 %. Body mass index is 39.87 kg/m. Physical Exam Vitals signs reviewed.  Constitutional:      Appearance: Normal appearance. He is obese.  Cardiovascular:     Rate and Rhythm: Normal rate.     Pulses: Normal pulses.  Pulmonary:     Effort: Pulmonary effort is normal.     Breath sounds: Normal breath sounds.  Musculoskeletal: Normal range of motion.  Skin:    General: Skin is warm and dry.  Neurological:     Mental Status: He is alert and oriented to person, place, and time.  Psychiatric:        Mood and Affect: Mood normal.        Behavior: Behavior normal.     RECENT LABS AND TESTS: BMET    Component Value Date/Time   NA 139 01/24/2018 1307   K 4.1 01/24/2018 1307   CL 102 01/24/2018 1307   CO2 23 01/24/2018 1307   GLUCOSE 108 (H) 01/24/2018 1307   GLUCOSE 117 (H) 11/13/2017 0938   BUN 13 01/24/2018 1307   CREATININE 0.74 (L) 01/24/2018 1307   CALCIUM 9.3 01/24/2018 1307   GFRNONAA 95 01/24/2018 1307   GFRAA 110 01/24/2018 1307   Lab Results  Component Value Date   HGBA1C 6.9 (H) 11/13/2017   HGBA1C 6.9 (H) 08/01/2017   HGBA1C 7.5 (H) 05/08/2017   HGBA1C 6.7 (H) 11/07/2016   HGBA1C 7.2 (H) 08/17/2016   Lab Results  Component Value Date   INSULIN 7.9 01/24/2018   CBC    Component Value Date/Time   WBC 7.4 01/24/2018 1307   WBC 5.4 05/08/2017 1032   RBC 4.45 01/24/2018 1307   RBC 4.31 05/08/2017 1032   HGB 13.0 01/24/2018 1307   HCT 38.1 01/24/2018 1307   PLT 265 01/24/2018 1307   MCV 86 01/24/2018 1307   MCH 29.2 01/24/2018 1307   MCHC 34.1 01/24/2018 1307   MCHC 33.3 05/08/2017 1032   RDW 12.8 01/24/2018 1307   LYMPHSABS 1.2 01/24/2018 1307   MONOABS 0.5 05/08/2017 1032   EOSABS 0.4 01/24/2018  1307   BASOSABS 0.1 01/24/2018 1307   Iron/TIBC/Ferritin/ %Sat    Component Value Date/Time   IRON 102 05/08/2017 1032   IRON 102 05/08/2017 1032   FERRITIN 64.0  05/08/2017 1032   IRONPCTSAT 19.9 (L) 05/08/2017 1032   Lipid Panel     Component Value Date/Time   CHOL 217 (H) 01/24/2018 1307   TRIG 128 01/24/2018 1307   HDL 56 01/24/2018 1307   CHOLHDL 3.9 01/24/2018 1307   CHOLHDL 2 11/13/2017 0938   VLDL 11.4 11/13/2017 0938   LDLCALC 135 (H) 01/24/2018 1307   LDLDIRECT 198.6 03/20/2009 1127   Hepatic Function Panel     Component Value Date/Time   PROT 6.7 01/24/2018 1307   ALBUMIN 4.4 01/24/2018 1307   AST 15 01/24/2018 1307   ALT 13 01/24/2018 1307   ALKPHOS 48 01/24/2018 1307   BILITOT 0.4 01/24/2018 1307   BILIDIR 0.1 08/14/2014 0824      Component Value Date/Time   TSH 1.050 01/24/2018 1307   TSH 1.28 11/07/2016 1008   TSH 0.98 09/27/2011 0945      OBESITY BEHAVIORAL INTERVENTION VISIT  Today's visit was # 7   Starting weight: 301 lbs Starting date: 01/24/18 Today's weight : 270 lbs  Today's date: 05/07/2018 Total lbs lost to date: 31 At least 15 minutes were spent on discussing the following behavioral intervention visit.    05/07/2018  Height  (1.753 m)  Weight 270 lb (122.5 kg)  BMI (Calculated) 39.85  BLOOD PRESSURE - SYSTOLIC 106  BLOOD PRESSURE - DIASTOLIC 66   Body Fat % 41.7 %    ASK: We discussed the diagnosis of obesity with Zachary Michael today and  agreed to give [name] permission to discuss obesity behavioral modification therapy today.  ASSESS: Zachary Michael has the diagnosis of obesity and his BMI today is 39.85 Zachary Michael is in the action stage of change   ADVISE: Zachary Michael was educated on the multiple health risks of obesity as well as the benefit of weight loss to improve his health. He was advised of the need for long term treatment and the importance of lifestyle modifications to improve his current health and to decrease his risk of future health problems.  AGREE: Multiple dietary modification options and treatment options were discussed and  Zachary Michael agreed to follow the recommendations  documented in the above note.  ARRANGE: Zachary Michael was educated on the importance of frequent visits to treat obesity as outlined per CMS and USPSTF guidelines and agreed to schedule his next follow up appointment today.  I, Burt Knack, am acting as transcriptionist for Debbra Riding, MD  I have reviewed the above documentation for accuracy and completeness, and I agree with the above. - Debbra Riding, MD

## 2018-05-08 LAB — LIPID PANEL WITH LDL/HDL RATIO
Cholesterol, Total: 148 mg/dL (ref 100–199)
HDL: 65 mg/dL (ref >39)
LDL Calculated: 74 mg/dL (ref 0–99)
LDl/HDL Ratio: 1.1 ratio (ref 0.0–3.6)
Triglycerides: 44 mg/dL (ref 0–149)
VLDL Cholesterol Cal: 9 mg/dL (ref 5–40)

## 2018-05-08 LAB — COMPREHENSIVE METABOLIC PANEL
ALT: 12 IU/L (ref 0–44)
AST: 14 IU/L (ref 0–40)
Albumin/Globulin Ratio: 2.4 — ABNORMAL HIGH (ref 1.2–2.2)
Albumin: 4.3 g/dL (ref 3.8–4.8)
Alkaline Phosphatase: 37 IU/L — ABNORMAL LOW (ref 39–117)
BUN/Creatinine Ratio: 24 (ref 10–24)
BUN: 19 mg/dL (ref 8–27)
Bilirubin Total: 0.4 mg/dL (ref 0.0–1.2)
CO2: 22 mmol/L (ref 20–29)
Calcium: 10.1 mg/dL (ref 8.6–10.2)
Chloride: 104 mmol/L (ref 96–106)
Creatinine, Ser: 0.78 mg/dL (ref 0.76–1.27)
GFR calc Af Amer: 107 mL/min/{1.73_m2} (ref >59)
GFR calc non Af Amer: 93 mL/min/{1.73_m2} (ref >59)
Globulin, Total: 1.8 g/dL (ref 1.5–4.5)
Glucose: 129 mg/dL — ABNORMAL HIGH (ref 65–99)
Potassium: 4.4 mmol/L (ref 3.5–5.2)
Sodium: 142 mmol/L (ref 134–144)
Total Protein: 6.1 g/dL (ref 6.0–8.5)

## 2018-05-08 LAB — HEMOGLOBIN A1C
Est. average glucose Bld gHb Est-mCnc: 137 mg/dL
Hgb A1c MFr Bld: 6.4 % — ABNORMAL HIGH (ref 4.8–5.6)

## 2018-05-08 LAB — INSULIN, RANDOM: INSULIN: 4.3 u[IU]/mL (ref 2.6–24.9)

## 2018-05-08 LAB — VITAMIN D 25 HYDROXY (VIT D DEFICIENCY, FRACTURES): Vit D, 25-Hydroxy: 53.9 ng/mL (ref 30.0–100.0)

## 2018-05-14 ENCOUNTER — Encounter: Payer: Self-pay | Admitting: Family Medicine

## 2018-05-14 ENCOUNTER — Other Ambulatory Visit: Payer: Self-pay

## 2018-05-14 ENCOUNTER — Ambulatory Visit: Payer: Medicare Other | Admitting: Family Medicine

## 2018-05-14 ENCOUNTER — Ambulatory Visit (INDEPENDENT_AMBULATORY_CARE_PROVIDER_SITE_OTHER): Payer: Medicare Other | Admitting: Family Medicine

## 2018-05-14 VITALS — BP 100/74 | HR 70 | Temp 98.5°F | Resp 16 | Ht 69.0 in | Wt 274.2 lb

## 2018-05-14 DIAGNOSIS — I2510 Atherosclerotic heart disease of native coronary artery without angina pectoris: Secondary | ICD-10-CM | POA: Diagnosis not present

## 2018-05-14 DIAGNOSIS — H6502 Acute serous otitis media, left ear: Secondary | ICD-10-CM

## 2018-05-14 DIAGNOSIS — E1165 Type 2 diabetes mellitus with hyperglycemia: Secondary | ICD-10-CM

## 2018-05-14 DIAGNOSIS — I10 Essential (primary) hypertension: Secondary | ICD-10-CM | POA: Diagnosis not present

## 2018-05-14 DIAGNOSIS — R079 Chest pain, unspecified: Secondary | ICD-10-CM | POA: Insufficient documentation

## 2018-05-14 MED ORDER — LORATADINE 10 MG PO TABS: 10.0000 mg | ORAL_TABLET | Freq: Every day | ORAL | 11 refills | Status: DC

## 2018-05-14 MED ORDER — FLUTICASONE PROPIONATE 50 MCG/ACT NA SUSP: 2.0000 | Freq: Every day | NASAL | 6 refills | Status: DC

## 2018-05-14 NOTE — Progress Notes (Signed)
Patient ID: Zachary Michael, male    DOB: 1950/09/01  Age: 68 y.o. MRN: [id number]    Subjective:  Subjective  HPI Zachary Michael presents for f/u dm, chol and htn.  Pt doing well with diet -- has lost 40 lbs with healthy weight and wellness.   Pt also c/o dizziness--- always turning to the left   He also c/o chest heaviness on the left side like the feeling you feel on a roller c Review of Systems  Constitutional: Negative for appetite change, diaphoresis, fatigue and unexpected weight change.  Eyes: Negative for pain, redness and visual disturbance.  Respiratory: Negative for cough, chest tightness, shortness of breath and wheezing.   Cardiovascular: Negative for chest pain, palpitations and leg swelling.  Endocrine: Negative for cold intolerance, heat intolerance, polydipsia, polyphagia and polyuria.  Genitourinary: Negative for difficulty urinating, dysuria and frequency.  Neurological: Negative for dizziness, light-headedness, numbness and headaches.    History Past Medical History:  Diagnosis Date  . Asthma   . Back pain   . CAD (coronary artery disease)    Mild  . Diabetes mellitus   . Heart valve problem   . HTN (hypertension)   . Hyperlipidemia   . Joint pain   . OSA (obstructive sleep apnea) 08/09/2017  . Palpitations   . SOB (shortness of breath)   . Subluxation    L2-3-4    He has a past surgical history that includes Appendectomy; Tonsillectomy; Tumor removal; Cardiac catheterization; and Cardiovascular stress test.   His family history includes Arthritis in his mother; Cancer in his brother and father; Diabetes in his maternal aunt and mother; Hypertension in his maternal aunt, maternal aunt, and mother; Lung cancer in his unknown relative; Osteoporosis in his mother; Pneumonia in his mother.He reports that he quit smoking about 40 years ago. His smoking use included pipe. He quit after 7.00 years of use. He has never used smokeless tobacco. He reports current  alcohol use. He reports that he does not use drugs.  Current Outpatient Medications on File Prior to Visit  Medication Sig Dispense Refill  . albuterol (PROAIR HFA) 108 (90 BASE) MCG/ACT inhaler Inhale 2 puffs into the lungs every 6 (six) hours as needed for wheezing or shortness of breath. 1 Inhaler 4  . aspirin 81 MG tablet Take 81 mg by mouth daily.      [name] b complex vitamins tablet Take 1 tablet by mouth daily.    . calcium carbonate (CALCIUM 600) 600 MG TABS tablet Take 600 mg by mouth daily with breakfast.    . carvedilol (COREG) 6.25 MG tablet Take 1 tablet (6.25 mg total) by mouth 2 (two) times daily with a meal. 180 tablet 1  . Cinnamon 500 MG capsule Take 500 mg by mouth daily.    [name] co-enzyme Q-10 30 MG capsule Take 30 mg by mouth 3 (three) times daily.    . fenofibrate 160 MG tablet Take 1 tablet (160 mg total) by mouth daily. 90 tablet 1  . ferrous sulfate 325 (65 FE) MG EC tablet Take 325 mg by mouth 3 (three) times daily with meals.    [name] glucose blood (ONETOUCH VERIO) test strip Use as instructed 100 each 0  . lisinopril (PRINIVIL,ZESTRIL) 10 MG tablet Take 1 tablet (10 mg total) by mouth daily. 90 tablet 1  . metFORMIN (GLUCOPHAGE) 1000 MG tablet Take 1 tablet (1,000 mg total) by mouth 2 (two) times daily with a meal. 180 tablet 1  . Multiple  Vitamin (MULTIVITAMIN) capsule Take 1 capsule by mouth daily.    . nitroGLYCERIN (NITROSTAT) 0.4 MG SL tablet Place 1 tablet (0.4 mg total) under the tongue every 5 (five) minutes as needed for chest pain. 25 tablet 5  . pioglitazone (ACTOS) 45 MG tablet Take 1 tablet (45 mg total) by mouth daily. 30 tablet 0  . simvastatin (ZOCOR) 40 MG tablet Take 1 tablet (40 mg total) by mouth at bedtime. 90 tablet 1  . Vitamin D, Ergocalciferol, (DRISDOL) 1.25 MG (50000 UT) CAPS capsule Take 1 capsule (50,000 Units total) by mouth every 7 (seven) days. 4 capsule 0  . Zinc 100 MG TABS Take 1 tablet by mouth daily.     No current facility-administered  medications on file prior to visit.      Objective:  Objective  Physical Exam Constitutional:      General: He is sleeping.     Appearance: He is well-developed.  HENT:     Head: Normocephalic and atraumatic.  Eyes:     Pupils: Pupils are equal, round, and reactive to light.  Neck:     Musculoskeletal: Normal range of motion and neck supple.     Thyroid: No thyromegaly.  Cardiovascular:     Rate and Rhythm: Normal rate and regular rhythm.     Heart sounds: No murmur.  Pulmonary:     Effort: Pulmonary effort is normal. No respiratory distress.     Breath sounds: Normal breath sounds. No wheezing or rales.  Chest:     Chest wall: No tenderness.  Musculoskeletal:        General: No tenderness.  Skin:    General: Skin is warm and dry.  Neurological:     Mental Status: He is oriented to person, place, and time.  Psychiatric:        Behavior: Behavior normal.        Thought Content: Thought content normal.        Judgment: Judgment normal.    BP 100/74 (BP Location: Left Arm, Cuff Size: Large)   Pulse 70   Temp 98.5 F (36.9 C) (Oral)   Resp 16   Ht  (1.753 m)   Wt 274 lb 3.2 oz (124.4 kg)   SpO2 98%   BMI 40.49 kg/m  Wt Readings from Last 3 Encounters:  05/14/18 274 lb 3.2 oz (124.4 kg)  05/07/18 270 lb (122.5 kg)  04/16/18 274 lb (124.3 kg)     Lab Results  Component Value Date   WBC 7.4 01/24/2018   HGB 13.0 01/24/2018   HCT 38.1 01/24/2018   PLT 265 01/24/2018   GLUCOSE 129 (H) 05/07/2018   CHOL 148 05/07/2018   TRIG 44 05/07/2018   HDL 65 05/07/2018   LDLDIRECT 198.6 03/20/2009   LDLCALC 74 05/07/2018   ALT 12 05/07/2018   AST 14 05/07/2018   NA 142 05/07/2018   K 4.4 05/07/2018   CL 104 05/07/2018   CREATININE 0.78 05/07/2018   BUN 19 05/07/2018   CO2 22 05/07/2018   TSH 1.050 01/24/2018   PSA 3.68 11/07/2016   INR 1.0 06/02/2008   HGBA1C 6.4 (H) 05/07/2018   MICROALBUR 1.2 11/06/2014    Dg Chest 2 View  Result Date: 11/06/2014  CLINICAL DATA:  Cough, wheezing for hours EXAM: CHEST  2 VIEW COMPARISON:  06/02/2008 FINDINGS: The heart size and mediastinal contours are within normal limits. Both lungs are clear. The visualized skeletal structures are unremarkable. IMPRESSION: No active cardiopulmonary disease. Electronically  Signed   By: Elige Ko   On: 11/06/2014 15:11     Assessment & Plan:  Plan  I have discontinued Celesta Gentile. Wacha's glimepiride. I am also having him start on fluticasone and loratadine. Additionally, I am having him maintain his aspirin, nitroGLYCERIN, albuterol, simvastatin, metFORMIN, lisinopril, carvedilol, fenofibrate, multivitamin, calcium carbonate, ferrous sulfate, b complex vitamins, co-enzyme Q-10, Cinnamon, Zinc, glucose blood, pioglitazone, and Vitamin D (Ergocalciferol).  Meds ordered this encounter  Medications  . fluticasone (FLONASE) 50 MCG/ACT nasal spray    Sig: Place 2 sprays into both nostrils daily.    Dispense:  16 g    Refill:  6  . loratadine (CLARITIN) 10 MG tablet    Sig: Take 1 tablet (10 mg total) by mouth daily.    Dispense:  30 tablet    Refill:  11    Problem List Items Addressed This Visit      Unprioritized   CAD (coronary artery disease)    Check labs con't meds      Chest pain - Primary    If pain returns go to ER Check echo- stress test ---F/u cardiology      Relevant Orders   EKG 12-Lead (Completed)   ECHOCARDIOGRAM COMPLETE   Diabetes mellitus type II, uncontrolled (HCC)    Lab Results  Component Value Date   HGBA1C 6.4 (H) 05/07/2018   con't meds Doing well with healthy weight and wellness       Hypertension    Well controlled, no changes to meds. Encouraged heart healthy diet such as the DASH diet and exercise as tolerated.       Non-recurrent acute serous otitis media of left ear    flonase and antihistamine  rto prn       Relevant Medications   fluticasone (FLONASE) 50 MCG/ACT nasal spray   loratadine (CLARITIN) 10 MG tablet       Follow-up: Return in about 6 months (around 11/14/2018), or if symptoms worsen or fail to improve, for hypertension, hyperlipidemia.  Donato Schultz, DO

## 2018-05-14 NOTE — Patient Instructions (Signed)
Vertigo  Vertigo is the feeling that you or your surroundings are moving when they are not. Vertigo can be dangerous if it occurs while you are doing something that could endanger you or others, such as driving.  What are the causes?  This condition is caused by a disturbance in the signals that are sent by your body’s sensory systems to your brain. Different causes of a disturbance can lead to vertigo, including:  · Infections, especially in the inner ear.  · A bad reaction to a drug, or misuse of alcohol and medicines.  · Withdrawal from drugs or alcohol.  · Quickly changing positions, as when lying down or rolling over in bed.  · Migraine headaches.  · Decreased blood flow to the brain.  · Decreased blood pressure.  · Increased pressure in the brain from a head or neck injury, stroke, infection, tumor, or bleeding.  · Central nervous system disorders.  What are the signs or symptoms?  Symptoms of this condition usually occur when you move your head or your eyes in different directions. Symptoms may start suddenly, and they usually last for less than a minute. Symptoms may include:  · Loss of balance and falling.  · Feeling like you are spinning or moving.  · Feeling like your surroundings are spinning or moving.  · Nausea and vomiting.  · Blurred vision or double vision.  · Difficulty hearing.  · Slurred speech.  · Dizziness.  · Involuntary eye movement (nystagmus).  Symptoms can be mild and cause only slight annoyance, or they can be severe and interfere with daily life. Episodes of vertigo may return (recur) over time, and they are often triggered by certain movements. Symptoms may improve over time.  How is this diagnosed?  This condition may be diagnosed based on medical history and the quality of your nystagmus. Your health care provider may test your eye movements by asking you to quickly change positions to trigger the nystagmus. This may be called the Dix-Hallpike test, head thrust test, or roll test. You  may be referred to a health care provider who specializes in ear, nose, and throat (ENT) problems (otolaryngologist) or a provider who specializes in disorders of the central nervous system (neurologist).  You may have additional testing, including:  · A physical exam.  · Blood tests.  · MRI.  · A CT scan.  · An electrocardiogram (ECG). This records electrical activity in your heart.  · An electroencephalogram (EEG). This records electrical activity in your brain.  · Hearing tests.  How is this treated?  Treatment for this condition depends on the cause and the severity of the symptoms. Treatment options include:  · Medicines to treat nausea or vertigo. These are usually used for severe cases. Some medicines that are used to treat other conditions may also reduce or eliminate vertigo symptoms. These include:  ? Medicines that control allergies (antihistamines).  ? Medicines that control seizures (anticonvulsants).  ? Medicines that relieve depression (antidepressants).  ? Medicines that relieve anxiety (sedatives).  · Head movements to adjust your inner ear back to normal. If your vertigo is caused by an ear problem, your health care provider may recommend certain movements to correct the problem.  · Surgery. This is rare.  Follow these instructions at home:  Safety  · Move slowly.Avoid sudden body or head movements.  · Avoid driving.  · Avoid operating heavy machinery.  · Avoid doing any tasks that would cause danger to you or others if   you would have a vertigo episode during the task.  · If you have trouble walking or keeping your balance, try using a cane for stability. If you feel dizzy or unstable, sit down right away.  · Return to your normal activities as told by your health care provider. Ask your health care provider what activities are safe for you.  General instructions  · Take over-the-counter and prescription medicines only as told by your health care provider.  · Avoid certain positions or movements as  told by your health care provider.  · Drink enough fluid to keep your urine clear or pale yellow.  · Keep all follow-up visits as told by your health care provider. This is important.  Contact a health care provider if:  · Your medicines do not relieve your vertigo or they make it worse.  · You have a fever.  · Your condition gets worse or you develop new symptoms.  · Your family or friends notice any behavioral changes.  · Your nausea or vomiting gets worse.  · You have numbness or a “pins and needles” sensation in part of your body.  Get help right away if:  · You have difficulty moving or speaking.  · You are always dizzy.  · You faint.  · You develop severe headaches.  · You have weakness in your hands, arms, or legs.  · You have changes in your hearing or vision.  · You develop a stiff neck.  · You develop sensitivity to light.  This information is not intended to replace advice given to you by your health care provider. Make sure you discuss any questions you have with your health care provider.  Document Released: 11/24/2004 Document Revised: 07/29/2015 Document Reviewed: 06/09/2014  Elsevier Interactive Patient Education © 2019 [employer]

## 2018-05-14 NOTE — Assessment & Plan Note (Signed)
flonase and antihistamine  ?rto prn  ?

## 2018-05-14 NOTE — Assessment & Plan Note (Addendum)
Lab Results  Component Value Date   HGBA1C 6.4 (H) 05/07/2018   con't meds Doing well with healthy weight and wellness

## 2018-05-14 NOTE — Assessment & Plan Note (Signed)
Well controlled, no changes to meds. Encouraged heart healthy diet such as the DASH diet and exercise as tolerated.  °

## 2018-05-14 NOTE — Assessment & Plan Note (Signed)
If pain returns go to ER Check echo- stress test ---F/u cardiology

## 2018-05-14 NOTE — Assessment & Plan Note (Signed)
Check labs con't meds 

## 2018-05-22 ENCOUNTER — Encounter (INDEPENDENT_AMBULATORY_CARE_PROVIDER_SITE_OTHER): Payer: Self-pay

## 2018-05-24 ENCOUNTER — Ambulatory Visit (INDEPENDENT_AMBULATORY_CARE_PROVIDER_SITE_OTHER): Payer: Medicare Other | Admitting: Family Medicine

## 2018-05-24 ENCOUNTER — Other Ambulatory Visit: Payer: Self-pay

## 2018-05-24 ENCOUNTER — Encounter (INDEPENDENT_AMBULATORY_CARE_PROVIDER_SITE_OTHER): Payer: Self-pay | Admitting: Family Medicine

## 2018-05-24 DIAGNOSIS — Z6841 Body Mass Index (BMI) 40.0 and over, adult: Secondary | ICD-10-CM

## 2018-05-24 DIAGNOSIS — Z794 Long term (current) use of insulin: Secondary | ICD-10-CM

## 2018-05-24 DIAGNOSIS — I2510 Atherosclerotic heart disease of native coronary artery without angina pectoris: Secondary | ICD-10-CM

## 2018-05-24 DIAGNOSIS — E1151 Type 2 diabetes mellitus with diabetic peripheral angiopathy without gangrene: Secondary | ICD-10-CM | POA: Diagnosis not present

## 2018-05-24 DIAGNOSIS — E1165 Type 2 diabetes mellitus with hyperglycemia: Secondary | ICD-10-CM

## 2018-05-24 DIAGNOSIS — E559 Vitamin D deficiency, unspecified: Secondary | ICD-10-CM

## 2018-05-24 DIAGNOSIS — IMO0002 Reserved for concepts with insufficient information to code with codable children: Secondary | ICD-10-CM

## 2018-05-24 MED ORDER — PIOGLITAZONE HCL 45 MG PO TABS: 45.0000 mg | ORAL_TABLET | Freq: Every day | ORAL | 0 refills | Status: DC

## 2018-05-24 MED ORDER — METFORMIN HCL 1000 MG PO TABS: 1000.0000 mg | ORAL_TABLET | Freq: Two times a day (BID) | ORAL | 0 refills | Status: DC

## 2018-05-28 NOTE — Progress Notes (Signed)
Office: [phone]  /  Fax: [phone] TeleHealth Visit:  Zachary DroneStephen P Michael has consented to this TeleHealth visit today via telephone call. The patient is located at home, the provider is located at the [facility] office. The participants in this visit include the listed provider and patient and provider's assistant.   HPI:   Chief Complaint: OBESITY Zachary Michael is here to discuss his progress with his obesity treatment plan. He is on the keep a food journal with 1450-1600 calories and 90+ grams of protein daily and is following his eating plan approximately 60-70 % of the time. He states he is walking 5,000 steps a day or walking 30-40 minutes a day. Zachary Michael has been recovering from and upper respiratory infection. He has been following the plan only 60-70 %, this depressed appetite. His appetite is back to normal now and no obstacles noted in the upcoming 2 weeks. We were unable to weight the patient today for this TeleHealth visit. He feels as if he has lost 8 lbs since his last visit. He has lost 31-39 lbs since starting treatment with us.  Diabetes II with Hyperglycemia Zachary Michael has a diagnosis of diabetes type II. Zachary Michael states fasting BGs range in 150's and he denies hypoglycemia episodes. He is tracking blood sugars on Excel. Last A1c was 6.4. He has been working on intensive lifestyle modifications including diet, exercise, and weight loss to help control his blood glucose levels.  Vitamin D Deficiency Zachary Michael has a diagnosis of vitamin D deficiency. He is currently taking prescription Vit D. Last Vit D level was 53.9. He denies nausea, vomiting or muscle weakness.  ASSESSMENT AND PLAN:  Type 2 diabetes mellitus with hyperglycemia, with long-term current use of insulin (HCC)  Vitamin D deficiency  Class 3 severe obesity with serious comorbidity and body mass index (BMI) of 40.0 to 44.9 in adult, unspecified obesity type (HCC)  DM (diabetes mellitus) type II  uncontrolled, periph vascular disorder (HCC) - Plan: metFORMIN (GLUCOPHAGE) 1000 MG tablet  Type 2 diabetes mellitus with hyperglycemia, without long-term current use of insulin (HCC) - Plan: pioglitazone (ACTOS) 45 MG tablet  PLAN:  Diabetes II with Hypeglycemia Zachary Michael has been given extensive diabetes education by myself today including ideal fasting and post-prandial blood glucose readings, individual ideal Hgb A1c goals and hypoglycemia prevention. We discussed the importance of good blood sugar control to decrease the likelihood of diabetic complications such as nephropathy, neuropathy, limb loss, blindness, coronary artery disease, and death. We discussed the importance of intensive lifestyle modification including diet, exercise and weight loss as the first line treatment for diabetes. Zachary Michael agrees to continue taking metformin 1,000 mg PO BID #60 and we will refill for 1 month, and he agrees to continue taking Actos 45 mg PO daily #30 and we will refill for 1 month. Zachary Michael agrees to follow up with our clinic in 2 weeks.  Vitamin D Deficiency Zachary Michael was informed that low vitamin D levels contributes to fatigue and are associated with obesity, breast, and colon cancer. Zachary Michael is to stop prescription Vit D and he agrees to start OTC Vit D 5,000 IU daily. He will follow up for routine testing of vitamin D, at least 2-3 times per year. He was informed of the risk of over-replacement of vitamin D and agrees to not increase his dose unless he discusses this with us first. Zachary Michael agrees to follow up with our clinic in 2 weeks.  Obesity Zachary Michael is currently in the action stage of change.  As such, his goal is to continue with weight loss efforts He has agreed to keep a food journal with 1450-1600 calories and 90+ grams of protein daily Zachary Michael has been instructed to work up to a goal of 150 minutes of combined cardio and strengthening exercise per week for weight loss and overall health benefits.  We discussed the following Behavioral Modification Strategies today: increasing lean protein intake, increasing vegetables, work on meal planning and easy cooking plans, and planning for success   Zachary Michael has agreed to follow up with our clinic in 2 weeks. He was informed of the importance of frequent follow up visits to maximize his success with intensive lifestyle modifications for his multiple health conditions.  ALLERGIES: No Known Allergies  MEDICATIONS: Current Outpatient Medications on File Prior to Visit  Medication Sig Dispense Refill  . albuterol (PROAIR HFA) 108 (90 BASE) MCG/ACT inhaler Inhale 2 puffs into the lungs every 6 (six) hours as needed for wheezing or shortness of breath. 1 Inhaler 4  . aspirin 81 MG tablet Take 81 mg by mouth daily.      [name] b complex vitamins tablet Take 1 tablet by mouth daily.    . calcium carbonate (CALCIUM 600) 600 MG TABS tablet Take 600 mg by mouth daily with breakfast.    . carvedilol (COREG) 6.25 MG tablet Take 1 tablet (6.25 mg total) by mouth 2 (two) times daily with a meal. 180 tablet 1  . Cinnamon 500 MG capsule Take 500 mg by mouth daily.    [name] co-enzyme Q-10 30 MG capsule Take 30 mg by mouth 3 (three) times daily.    . fenofibrate 160 MG tablet Take 1 tablet (160 mg total) by mouth daily. 90 tablet 1  . ferrous sulfate 325 (65 FE) MG EC tablet Take 325 mg by mouth 3 (three) times daily with meals.    . fluticasone (FLONASE) 50 MCG/ACT nasal spray Place 2 sprays into both nostrils daily. 16 g 6  . glucose blood (ONETOUCH VERIO) test strip Use as instructed 100 each 0  . lisinopril (PRINIVIL,ZESTRIL) 10 MG tablet Take 1 tablet (10 mg total) by mouth daily. 90 tablet 1  . loratadine (CLARITIN) 10 MG tablet Take 1 tablet (10 mg total) by mouth daily. 30 tablet 11  . Multiple Vitamin (MULTIVITAMIN) capsule Take 1 capsule by mouth daily.    . nitroGLYCERIN (NITROSTAT) 0.4 MG SL tablet Place 1 tablet (0.4 mg total) under the tongue every 5 (five)  minutes as needed for chest pain. 25 tablet 5  . simvastatin (ZOCOR) 40 MG tablet Take 1 tablet (40 mg total) by mouth at bedtime. 90 tablet 1  . Zinc 100 MG TABS Take 1 tablet by mouth daily.     No current facility-administered medications on file prior to visit.     PAST MEDICAL HISTORY: Past Medical History:  Diagnosis Date  . Asthma   . Back pain   . CAD (coronary artery disease)    Mild  . Diabetes mellitus   . Heart valve problem   . HTN (hypertension)   . Hyperlipidemia   . Joint pain   . OSA (obstructive sleep apnea) 08/09/2017  . Palpitations   . SOB (shortness of breath)   . Subluxation    L2-3-4    PAST SURGICAL HISTORY: Past Surgical History:  Procedure Laterality Date  . APPENDECTOMY    . CARDIAC CATHETERIZATION     We recently performed a which revealed minor coronary artery irregularities. He had normal  left ventricle systolic function with an EF of 60%  . CARDIOVASCULAR STRESS TEST     EF 63%  . TONSILLECTOMY    . TUMOR REMOVAL     LEFT ARM    SOCIAL HISTORY: Social History   Tobacco Use  . Smoking status: Former Smoker    Years: 7.00    Types: Pipe    Last attempt to quit: 1980    Years since quitting: 40.2  . Smokeless tobacco: Never Used  Substance Use Topics  . Alcohol use: Yes    Comment: occassional  . Drug use: No    FAMILY HISTORY: Family History  Problem Relation Age of Onset  . Pneumonia Mother   . Osteoporosis Mother   . Arthritis Mother   . Diabetes Mother   . Hypertension Mother   . Cancer Father        leukemia, lung  . Cancer Brother   . Diabetes Maternal Aunt   . Hypertension Maternal Aunt   . Hypertension Maternal Aunt   . Lung cancer Unknown     ROS: Review of Systems  Constitutional: Positive for weight loss.  Gastrointestinal: Negative for nausea and vomiting.  Musculoskeletal:       Negative muscle weakness  Endo/Heme/Allergies:       Negative hypoglycemia    PHYSICAL EXAM: Pt in no acute distress   RECENT LABS AND TESTS: BMET    Component Value Date/Time   NA 142 05/07/2018 1321   K 4.4 05/07/2018 1321   CL 104 05/07/2018 1321   CO2 22 05/07/2018 1321   GLUCOSE 129 (H) 05/07/2018 1321   GLUCOSE 117 (H) 11/13/2017 0938   BUN 19 05/07/2018 1321   CREATININE 0.78 05/07/2018 1321   CALCIUM 10.1 05/07/2018 1321   GFRNONAA 93 05/07/2018 1321   GFRAA 107 05/07/2018 1321   Lab Results  Component Value Date   HGBA1C 6.4 (H) 05/07/2018   HGBA1C 6.9 (H) 11/13/2017   HGBA1C 6.9 (H) 08/01/2017   HGBA1C 7.5 (H) 05/08/2017   HGBA1C 6.7 (H) 11/07/2016   Lab Results  Component Value Date   INSULIN 4.3 05/07/2018   INSULIN 7.9 01/24/2018   CBC    Component Value Date/Time   WBC 7.4 01/24/2018 1307   WBC 5.4 05/08/2017 1032   RBC 4.45 01/24/2018 1307   RBC 4.31 05/08/2017 1032   HGB 13.0 01/24/2018 1307   HCT 38.1 01/24/2018 1307   PLT 265 01/24/2018 1307   MCV 86 01/24/2018 1307   MCH 29.2 01/24/2018 1307   MCHC 34.1 01/24/2018 1307   MCHC 33.3 05/08/2017 1032   RDW 12.8 01/24/2018 1307   LYMPHSABS 1.2 01/24/2018 1307   MONOABS 0.5 05/08/2017 1032   EOSABS 0.4 01/24/2018 1307   BASOSABS 0.1 01/24/2018 1307   Iron/TIBC/Ferritin/ %Sat    Component Value Date/Time   IRON 102 05/08/2017 1032   IRON 102 05/08/2017 1032   FERRITIN 64.0 05/08/2017 1032   IRONPCTSAT 19.9 (L) 05/08/2017 1032   Lipid Panel     Component Value Date/Time   CHOL 148 05/07/2018 1321   TRIG 44 05/07/2018 1321   HDL 65 05/07/2018 1321   CHOLHDL 3.9 01/24/2018 1307   CHOLHDL 2 11/13/2017 0938   VLDL 11.4 11/13/2017 0938   LDLCALC 74 05/07/2018 1321   LDLDIRECT 198.6 03/20/2009 1127   Hepatic Function Panel     Component Value Date/Time   PROT 6.1 05/07/2018 1321   ALBUMIN 4.3 05/07/2018 1321   AST 14 05/07/2018 1321  ALT 12 05/07/2018 1321   ALKPHOS 37 (L) 05/07/2018 1321   BILITOT 0.4 05/07/2018 1321   BILIDIR 0.1 08/14/2014 0824      Component Value Date/Time   TSH 1.050  01/24/2018 1307   TSH 1.28 11/07/2016 1008   TSH 0.98 09/27/2011 0945      I, Burt Knack, am acting as transcriptionist for Debbra Riding, MD  I have reviewed the above documentation for accuracy and completeness, and I agree with the above. - Debbra Riding, MD

## 2018-06-06 ENCOUNTER — Other Ambulatory Visit: Payer: Self-pay

## 2018-06-06 ENCOUNTER — Encounter (INDEPENDENT_AMBULATORY_CARE_PROVIDER_SITE_OTHER): Payer: Self-pay | Admitting: Family Medicine

## 2018-06-06 ENCOUNTER — Ambulatory Visit (INDEPENDENT_AMBULATORY_CARE_PROVIDER_SITE_OTHER): Payer: Medicare Other | Admitting: Family Medicine

## 2018-06-06 DIAGNOSIS — E1165 Type 2 diabetes mellitus with hyperglycemia: Secondary | ICD-10-CM | POA: Diagnosis not present

## 2018-06-06 DIAGNOSIS — Z6841 Body Mass Index (BMI) 40.0 and over, adult: Secondary | ICD-10-CM | POA: Diagnosis not present

## 2018-06-06 DIAGNOSIS — I2510 Atherosclerotic heart disease of native coronary artery without angina pectoris: Secondary | ICD-10-CM | POA: Diagnosis not present

## 2018-06-06 DIAGNOSIS — E66813 Obesity, class 3: Secondary | ICD-10-CM

## 2018-06-06 DIAGNOSIS — E559 Vitamin D deficiency, unspecified: Secondary | ICD-10-CM | POA: Diagnosis not present

## 2018-06-06 MED ORDER — PIOGLITAZONE HCL 45 MG PO TABS: 45.0000 mg | ORAL_TABLET | Freq: Every day | ORAL | 0 refills | Status: DC

## 2018-06-06 NOTE — Progress Notes (Signed)
Office: [phone]  /  Fax: [phone] TeleHealth Visit:  Zachary DroneStephen P Neubauer has verbally consented to this TeleHealth visit today. The patient is located at home, the provider is located at the [facility] office. The participants in this visit include the listed provider and patient and provider's assistant. The visit was conducted today via webex.  HPI:   Chief Complaint: OBESITY Zachary Michael is here to discuss his progress with his obesity treatment plan. He is on the keep a food journal with 1450-1600 calories and 90+ grams of protein daily and is following his eating plan approximately 100 % of the time. He states he is exercising 22-30 minutes 7 times per week and doing yard work for 2 hours a week. Zachary Michael reports that not much has changed in the past few weeks. He denies stress eating even with his daughters at home. He notes only occasional hunger. He averages about 1420 calories a day and 110 grams of protein a day.  We were unable to weigh the patient today for this TeleHealth visit. He feels as if he has lost 2 lbs since his last visit. He has lost 31-33 lbs since starting treatment with us.  Diabetes II with Hyperglycemia Zachary Michael has a diagnosis of diabetes type II. Zachary Michael states fasting BGs averaging at 134 (down 10 points from last month). He notes 1 hypoglycemic event but his BGs was at 112 when checking it. He is on statin. Last A1c was 6.4. He has been working on intensive lifestyle modifications including diet, exercise, and weight loss to help control his blood glucose levels.  Vitamin D Deficiency Zachary Michael has a diagnosis of vitamin D deficiency. He is currently taking OTC Vit D daily. He denies nausea, vomiting or muscle weakness.  ASSESSMENT AND PLAN:  Type 2 diabetes mellitus with hyperglycemia, without long-term current use of insulin (HCC) - Plan: pioglitazone (ACTOS) 45 MG tablet  Vitamin D deficiency  Class 3 severe obesity with serious comorbidity  and body mass index (BMI) of 40.0 to 44.9 in adult, unspecified obesity type (HCC)  PLAN:  Diabetes II with Hyperglycemia Zachary Michael has been given extensive diabetes education by myself today including ideal fasting and post-prandial blood glucose readings, individual ideal Hgb A1c goals and hypoglycemia prevention. We discussed the importance of good blood sugar control to decrease the likelihood of diabetic complications such as nephropathy, neuropathy, limb loss, blindness, coronary artery disease, and death. We discussed the importance of intensive lifestyle modification including diet, exercise and weight loss as the first line treatment for diabetes. Zachary Michael agrees to continue taking Actos 45 mg PO daily #30 and we will refill for 1 month. Zachary Michael agrees to follow up with our clinic in 2 weeks.  Vitamin D Deficiency Zachary Michael was informed that low vitamin D levels contributes to fatigue and are associated with obesity, breast, and colon cancer. Zachary Michael agrees to continue taking OTC Vit D and will follow up for routine testing of vitamin D, at least 2-3 times per year. He was informed of the risk of over-replacement of vitamin D and agrees to not increase his dose unless he discusses this with us first. Zachary Michael agrees to follow up with our clinic in 2 weeks.  Obesity Zachary Michael is currently in the action stage of change. As such, his goal is to continue with weight loss efforts He has agreed to keep a food journal with 1450-1600 calories and 90+ grams of protein daily Zachary Michael has been instructed to work up to a goal of 150  minutes of combined cardio and strengthening exercise per week for weight loss and overall health benefits. We discussed the following Behavioral Modification Strategies today: increasing lean protein intake, increasing vegetables and work on meal planning and easy cooking plans   Zachary Michael has agreed to follow up with our clinic in 2 weeks. He was informed of the importance of  frequent follow up visits to maximize his success with intensive lifestyle modifications for his multiple health conditions.  ALLERGIES: No Known Allergies  MEDICATIONS: Current Outpatient Medications on File Prior to Visit  Medication Sig Dispense Refill  . albuterol (PROAIR HFA) 108 (90 BASE) MCG/ACT inhaler Inhale 2 puffs into the lungs every 6 (six) hours as needed for wheezing or shortness of breath. 1 Inhaler 4  . aspirin 81 MG tablet Take 81 mg by mouth daily.      [name] b complex vitamins tablet Take 1 tablet by mouth daily.    . calcium carbonate (CALCIUM 600) 600 MG TABS tablet Take 600 mg by mouth daily with breakfast.    . carvedilol (COREG) 6.25 MG tablet Take 1 tablet (6.25 mg total) by mouth 2 (two) times daily with a meal. 180 tablet 1  . Cinnamon 500 MG capsule Take 500 mg by mouth daily.    [name] co-enzyme Q-10 30 MG capsule Take 30 mg by mouth 3 (three) times daily.    . fenofibrate 160 MG tablet Take 1 tablet (160 mg total) by mouth daily. 90 tablet 1  . ferrous sulfate 325 (65 FE) MG EC tablet Take 325 mg by mouth 3 (three) times daily with meals.    . fluticasone (FLONASE) 50 MCG/ACT nasal spray Place 2 sprays into both nostrils daily. 16 g 6  . glucose blood (ONETOUCH VERIO) test strip Use as instructed 100 each 0  . lisinopril (PRINIVIL,ZESTRIL) 10 MG tablet Take 1 tablet (10 mg total) by mouth daily. 90 tablet 1  . loratadine (CLARITIN) 10 MG tablet Take 1 tablet (10 mg total) by mouth daily. 30 tablet 11  . metFORMIN (GLUCOPHAGE) 1000 MG tablet Take 1 tablet (1,000 mg total) by mouth 2 (two) times daily with a meal. 60 tablet 0  . Multiple Vitamin (MULTIVITAMIN) capsule Take 1 capsule by mouth daily.    . nitroGLYCERIN (NITROSTAT) 0.4 MG SL tablet Place 1 tablet (0.4 mg total) under the tongue every 5 (five) minutes as needed for chest pain. 25 tablet 5  . simvastatin (ZOCOR) 40 MG tablet Take 1 tablet (40 mg total) by mouth at bedtime. 90 tablet 1  . Zinc 100 MG TABS Take  1 tablet by mouth daily.     No current facility-administered medications on file prior to visit.     PAST MEDICAL HISTORY: Past Medical History:  Diagnosis Date  . Asthma   . Back pain   . CAD (coronary artery disease)    Mild  . Diabetes mellitus   . Heart valve problem   . HTN (hypertension)   . Hyperlipidemia   . Joint pain   . OSA (obstructive sleep apnea) 08/09/2017  . Palpitations   . SOB (shortness of breath)   . Subluxation    L2-3-4    PAST SURGICAL HISTORY: Past Surgical History:  Procedure Laterality Date  . APPENDECTOMY    . CARDIAC CATHETERIZATION     We recently performed a which revealed minor coronary artery irregularities. He had normal left ventricle systolic function with an EF of 60%  . CARDIOVASCULAR STRESS TEST  EF 63%  . TONSILLECTOMY    . TUMOR REMOVAL     LEFT ARM    SOCIAL HISTORY: Social History   Tobacco Use  . Smoking status: Former Smoker    Years: 7.00    Types: Pipe    Last attempt to quit: 1980    Years since quitting: 40.2  . Smokeless tobacco: Never Used  Substance Use Topics  . Alcohol use: Yes    Comment: occassional  . Drug use: No    FAMILY HISTORY: Family History  Problem Relation Age of Onset  . Pneumonia Mother   . Osteoporosis Mother   . Arthritis Mother   . Diabetes Mother   . Hypertension Mother   . Cancer Father        leukemia, lung  . Cancer Brother   . Diabetes Maternal Aunt   . Hypertension Maternal Aunt   . Hypertension Maternal Aunt   . Lung cancer Unknown     ROS: Review of Systems  Constitutional: Positive for weight loss.  Gastrointestinal: Negative for nausea and vomiting.  Musculoskeletal:       Negative muscle weakness  Endo/Heme/Allergies:       Negative hypoglycemia    PHYSICAL EXAM: Pt in no acute distress  RECENT LABS AND TESTS: BMET    Component Value Date/Time   NA 142 05/07/2018 1321   K 4.4 05/07/2018 1321   CL 104 05/07/2018 1321   CO2 22 05/07/2018 1321    GLUCOSE 129 (H) 05/07/2018 1321   GLUCOSE 117 (H) 11/13/2017 0938   BUN 19 05/07/2018 1321   CREATININE 0.78 05/07/2018 1321   CALCIUM 10.1 05/07/2018 1321   GFRNONAA 93 05/07/2018 1321   GFRAA 107 05/07/2018 1321   Lab Results  Component Value Date   HGBA1C 6.4 (H) 05/07/2018   HGBA1C 6.9 (H) 11/13/2017   HGBA1C 6.9 (H) 08/01/2017   HGBA1C 7.5 (H) 05/08/2017   HGBA1C 6.7 (H) 11/07/2016   Lab Results  Component Value Date   INSULIN 4.3 05/07/2018   INSULIN 7.9 01/24/2018   CBC    Component Value Date/Time   WBC 7.4 01/24/2018 1307   WBC 5.4 05/08/2017 1032   RBC 4.45 01/24/2018 1307   RBC 4.31 05/08/2017 1032   HGB 13.0 01/24/2018 1307   HCT 38.1 01/24/2018 1307   PLT 265 01/24/2018 1307   MCV 86 01/24/2018 1307   MCH 29.2 01/24/2018 1307   MCHC 34.1 01/24/2018 1307   MCHC 33.3 05/08/2017 1032   RDW 12.8 01/24/2018 1307   LYMPHSABS 1.2 01/24/2018 1307   MONOABS 0.5 05/08/2017 1032   EOSABS 0.4 01/24/2018 1307   BASOSABS 0.1 01/24/2018 1307   Iron/TIBC/Ferritin/ %Sat    Component Value Date/Time   IRON 102 05/08/2017 1032   IRON 102 05/08/2017 1032   FERRITIN 64.0 05/08/2017 1032   IRONPCTSAT 19.9 (L) 05/08/2017 1032   Lipid Panel     Component Value Date/Time   CHOL 148 05/07/2018 1321   TRIG 44 05/07/2018 1321   HDL 65 05/07/2018 1321   CHOLHDL 3.9 01/24/2018 1307   CHOLHDL 2 11/13/2017 0938   VLDL 11.4 11/13/2017 0938   LDLCALC 74 05/07/2018 1321   LDLDIRECT 198.6 03/20/2009 1127   Hepatic Function Panel     Component Value Date/Time   PROT 6.1 05/07/2018 1321   ALBUMIN 4.3 05/07/2018 1321   AST 14 05/07/2018 1321   ALT 12 05/07/2018 1321   ALKPHOS 37 (L) 05/07/2018 1321   BILITOT 0.4 05/07/2018 1321  BILIDIR 0.1 08/14/2014 0824      Component Value Date/Time   TSH 1.050 01/24/2018 1307   TSH 1.28 11/07/2016 1008   TSH 0.98 09/27/2011 0945      I, Burt Knack, am acting as transcriptionist for Debbra Riding, MD  I have  reviewed the above documentation for accuracy and completeness, and I agree with the above. - Debbra Riding, MD

## 2018-06-20 ENCOUNTER — Other Ambulatory Visit: Payer: Self-pay

## 2018-06-20 ENCOUNTER — Ambulatory Visit (INDEPENDENT_AMBULATORY_CARE_PROVIDER_SITE_OTHER): Payer: Medicare Other | Admitting: Family Medicine

## 2018-06-20 ENCOUNTER — Other Ambulatory Visit: Payer: Self-pay | Admitting: Family Medicine

## 2018-06-20 ENCOUNTER — Encounter (INDEPENDENT_AMBULATORY_CARE_PROVIDER_SITE_OTHER): Payer: Self-pay | Admitting: Family Medicine

## 2018-06-20 DIAGNOSIS — E785 Hyperlipidemia, unspecified: Secondary | ICD-10-CM | POA: Diagnosis not present

## 2018-06-20 DIAGNOSIS — E1165 Type 2 diabetes mellitus with hyperglycemia: Secondary | ICD-10-CM

## 2018-06-20 DIAGNOSIS — I2510 Atherosclerotic heart disease of native coronary artery without angina pectoris: Secondary | ICD-10-CM | POA: Diagnosis not present

## 2018-06-20 DIAGNOSIS — Z6841 Body Mass Index (BMI) 40.0 and over, adult: Secondary | ICD-10-CM | POA: Diagnosis not present

## 2018-06-20 MED ORDER — SIMVASTATIN 40 MG PO TABS: 40.0000 mg | ORAL_TABLET | Freq: Every day | ORAL | 0 refills | Status: DC

## 2018-06-20 MED ORDER — FENOFIBRATE 160 MG PO TABS: 160.0000 mg | ORAL_TABLET | Freq: Every day | ORAL | 0 refills | Status: DC

## 2018-06-25 NOTE — Progress Notes (Signed)
Office: [phone]  /  Fax: [phone] TeleHealth Visit:  [name] Hoaglin has verbally consented to this TeleHealth visit today. The patient is located at home, the provider is located at the [facility] office. The participants in this visit include the listed provider and patient and patient's wife. The visit was conducted today via webex.  HPI:   Chief Complaint: OBESITY Zachary Michael is here to discuss his progress with his obesity treatment plan. He is on the keep a food journal with 1450-1600 calories and 90+ grams of protein daily and is following his eating plan approximately 90 % of the time. He states he is walking for 30-40 minutes 1-2 times per week. Zachary Michael reports that he has been getting outside daily, doing yard work and sitting outside. He notes minimal hunger. He spaces out meals to be able to get food he wants for lunch and dinner.  We were unable to weigh the patient today for this TeleHealth visit. He feels as if he has lost 1 lb since his last visit. He has lost 31-32 lbs since starting treatment with us.  Diabetes II with Hyperglycemia Zachary Michael has a diagnosis of diabetes type II. Zachary Michael states his fasting BGs range between 110 and 120's, average at 123 per month and past 2 weeks averaging at 115. He notes 1 episode of hypoglycemia of 89 and felt symptoms. Last Hgb A1c was 6.4. He has been working on intensive lifestyle modifications including diet, exercise, and weight loss to help control his blood glucose levels.  Hyperlipidemia Zachary Michael has hyperlipidemia and has been trying to improve his cholesterol levels with intensive lifestyle modification including a low saturated fat diet, exercise and weight loss. He is on statin and Fenofibrate and denies any chest pain, claudication or myalgias.  ASSESSMENT AND PLAN:  Hyperlipidemia LDL goal <70 - Plan: DISCONTINUED: fenofibrate 160 MG tablet, DISCONTINUED: simvastatin (ZOCOR) 40 MG tablet  Type 2 diabetes  mellitus with hyperglycemia, without long-term current use of insulin (HCC)  Class 3 severe obesity with serious comorbidity and body mass index (BMI) of 40.0 to 44.9 in adult, unspecified obesity type (HCC)  PLAN:  Diabetes II with Hyperglycemia Zachary Michael has been given extensive diabetes education by myself today including ideal fasting and post-prandial blood glucose readings, individual ideal Hgb A1c goals and hypoglycemia prevention. We discussed the importance of good blood sugar control to decrease the likelihood of diabetic complications such as nephropathy, neuropathy, limb loss, blindness, coronary artery disease, and death. We discussed the importance of intensive lifestyle modification including diet, exercise and weight loss as the first line treatment for diabetes. Zachary Michael agrees to continue his diabetes medications, and he agrees to follow up with our clinic in 2 weeks.  Hyperlipidemia Zachary Michael was informed of the American Heart Association Guidelines emphasizing intensive lifestyle modifications as the first line treatment for hyperlipidemia. We discussed many lifestyle modifications today in depth, and Zachary Michael will continue to work on decreasing saturated fats such as fatty red meat, butter and many fried foods. He will also increase vegetables and lean protein in his diet and continue to work on exercise and weight loss efforts. Zachary Michael agrees to continue taking Zocor 40 mg PO q PM #30 and we will refill for 1 month, and he agrees to continue taking Fenofibrate 160 mg 1 tablet PO daily #30 and we will refill for 1 month. Zachary Michael agrees to follow up with our clinic in 2 weeks.  Obesity Zachary Michael is currently in the action stage of change. As  such, his goal is to continue with weight loss efforts He has agreed to keep a food journal with 1400-1600 calories and 100+ grams of protein daily Roshun has been instructed to work up to a goal of 150 minutes of combined cardio and strengthening  exercise per week for weight loss and overall health benefits. We discussed the following Behavioral Modification Strategies today: increasing lean protein intake, increasing vegetables, work on meal planning and easy cooking plans, no skipping meals, keeping healthy foods in the home, better snacking choices, and planning for success   [name] has agreed to follow up with our clinic in 2 weeks. He was informed of the importance of frequent follow up visits to maximize his success with intensive lifestyle modifications for his multiple health conditions.  ALLERGIES: No Known Allergies  MEDICATIONS: Current Outpatient Medications on File Prior to Visit  Medication Sig Dispense Refill  . albuterol (PROAIR HFA) 108 (90 BASE) MCG/ACT inhaler Inhale 2 puffs into the lungs every 6 (six) hours as needed for wheezing or shortness of breath. 1 Inhaler 4  . aspirin 81 MG tablet Take 81 mg by mouth daily.      [name] b complex vitamins tablet Take 1 tablet by mouth daily.    . calcium carbonate (CALCIUM 600) 600 MG TABS tablet Take 600 mg by mouth daily with breakfast.    . carvedilol (COREG) 6.25 MG tablet Take 1 tablet (6.25 mg total) by mouth 2 (two) times daily with a meal. 180 tablet 1  . Cinnamon 500 MG capsule Take 500 mg by mouth daily.    [name] co-enzyme Q-10 30 MG capsule Take 30 mg by mouth 3 (three) times daily.    . ferrous sulfate 325 (65 FE) MG EC tablet Take 325 mg by mouth 3 (three) times daily with meals.    . fluticasone (FLONASE) 50 MCG/ACT nasal spray Place 2 sprays into both nostrils daily. 16 g 6  . glucose blood (ONETOUCH VERIO) test strip Use as instructed 100 each 0  . lisinopril (PRINIVIL,ZESTRIL) 10 MG tablet Take 1 tablet (10 mg total) by mouth daily. 90 tablet 1  . loratadine (CLARITIN) 10 MG tablet Take 1 tablet (10 mg total) by mouth daily. 30 tablet 11  . metFORMIN (GLUCOPHAGE) 1000 MG tablet Take 1 tablet (1,000 mg total) by mouth 2 (two) times daily with a meal. 60 tablet 0  .  Multiple Vitamin (MULTIVITAMIN) capsule Take 1 capsule by mouth daily.    . nitroGLYCERIN (NITROSTAT) 0.4 MG SL tablet Place 1 tablet (0.4 mg total) under the tongue every 5 (five) minutes as needed for chest pain. 25 tablet 5  . pioglitazone (ACTOS) 45 MG tablet Take 1 tablet (45 mg total) by mouth daily. 30 tablet 0  . Zinc 100 MG TABS Take 1 tablet by mouth daily.     No current facility-administered medications on file prior to visit.     PAST MEDICAL HISTORY: Past Medical History:  Diagnosis Date  . Asthma   . Back pain   . CAD (coronary artery disease)    Mild  . Diabetes mellitus   . Heart valve problem   . HTN (hypertension)   . Hyperlipidemia   . Joint pain   . OSA (obstructive sleep apnea) 08/09/2017  . Palpitations   . SOB (shortness of breath)   . Subluxation    L2-3-4    PAST SURGICAL HISTORY: Past Surgical History:  Procedure Laterality Date  . APPENDECTOMY    . CARDIAC CATHETERIZATION  We recently performed a which revealed minor coronary artery irregularities. He had normal left ventricle systolic function with an EF of 60%  . CARDIOVASCULAR STRESS TEST     EF 63%  . TONSILLECTOMY    . TUMOR REMOVAL     LEFT ARM    SOCIAL HISTORY: Social History   Tobacco Use  . Smoking status: Former Smoker    Years: 7.00    Types: Pipe    Last attempt to quit: 1980    Years since quitting: 40.3  . Smokeless tobacco: Never Used  Substance Use Topics  . Alcohol use: Yes    Comment: occassional  . Drug use: No    FAMILY HISTORY: Family History  Problem Relation Age of Onset  . Pneumonia Mother   . Osteoporosis Mother   . Arthritis Mother   . Diabetes Mother   . Hypertension Mother   . Cancer Father        leukemia, lung  . Cancer Brother   . Diabetes Maternal Aunt   . Hypertension Maternal Aunt   . Hypertension Maternal Aunt   . Lung cancer Unknown     ROS: Review of Systems  Constitutional: Positive for weight loss.  Cardiovascular:  Negative for chest pain and claudication.  Musculoskeletal: Negative for myalgias.  Endo/Heme/Allergies:       Negative hypoglycemia    PHYSICAL EXAM: Pt in no acute distress  RECENT LABS AND TESTS: BMET    Component Value Date/Time   NA 142 05/07/2018 1321   K 4.4 05/07/2018 1321   CL 104 05/07/2018 1321   CO2 22 05/07/2018 1321   GLUCOSE 129 (H) 05/07/2018 1321   GLUCOSE 117 (H) 11/13/2017 0938   BUN 19 05/07/2018 1321   CREATININE 0.78 05/07/2018 1321   CALCIUM 10.1 05/07/2018 1321   GFRNONAA 93 05/07/2018 1321   GFRAA 107 05/07/2018 1321   Lab Results  Component Value Date   HGBA1C 6.4 (H) 05/07/2018   HGBA1C 6.9 (H) 11/13/2017   HGBA1C 6.9 (H) 08/01/2017   HGBA1C 7.5 (H) 05/08/2017   HGBA1C 6.7 (H) 11/07/2016   Lab Results  Component Value Date   INSULIN 4.3 05/07/2018   INSULIN 7.9 01/24/2018   CBC    Component Value Date/Time   WBC 7.4 01/24/2018 1307   WBC 5.4 05/08/2017 1032   RBC 4.45 01/24/2018 1307   RBC 4.31 05/08/2017 1032   HGB 13.0 01/24/2018 1307   HCT 38.1 01/24/2018 1307   PLT 265 01/24/2018 1307   MCV 86 01/24/2018 1307   MCH 29.2 01/24/2018 1307   MCHC 34.1 01/24/2018 1307   MCHC 33.3 05/08/2017 1032   RDW 12.8 01/24/2018 1307   LYMPHSABS 1.2 01/24/2018 1307   MONOABS 0.5 05/08/2017 1032   EOSABS 0.4 01/24/2018 1307   BASOSABS 0.1 01/24/2018 1307   Iron/TIBC/Ferritin/ %Sat    Component Value Date/Time   IRON 102 05/08/2017 1032   IRON 102 05/08/2017 1032   FERRITIN 64.0 05/08/2017 1032   IRONPCTSAT 19.9 (L) 05/08/2017 1032   Lipid Panel     Component Value Date/Time   CHOL 148 05/07/2018 1321   TRIG 44 05/07/2018 1321   HDL 65 05/07/2018 1321   CHOLHDL 3.9 01/24/2018 1307   CHOLHDL 2 11/13/2017 0938   VLDL 11.4 11/13/2017 0938   LDLCALC 74 05/07/2018 1321   LDLDIRECT 198.6 03/20/2009 1127   Hepatic Function Panel     Component Value Date/Time   PROT 6.1 05/07/2018 1321   ALBUMIN 4.3 05/07/2018  1321   AST 14  05/07/2018 1321   ALT 12 05/07/2018 1321   ALKPHOS 37 (L) 05/07/2018 1321   BILITOT 0.4 05/07/2018 1321   BILIDIR 0.1 08/14/2014 0824      Component Value Date/Time   TSH 1.050 01/24/2018 1307   TSH 1.28 11/07/2016 1008   TSH 0.98 09/27/2011 0945      I, Burt Knack, am acting as transcriptionist for Debbra Riding, MD  I have reviewed the above documentation for accuracy and completeness, and I agree with the above. - Debbra Riding, MD

## 2018-06-29 ENCOUNTER — Other Ambulatory Visit (INDEPENDENT_AMBULATORY_CARE_PROVIDER_SITE_OTHER): Payer: Self-pay | Admitting: Family Medicine

## 2018-06-29 DIAGNOSIS — E1165 Type 2 diabetes mellitus with hyperglycemia: Secondary | ICD-10-CM

## 2018-07-03 ENCOUNTER — Other Ambulatory Visit: Payer: Self-pay

## 2018-07-03 ENCOUNTER — Encounter (INDEPENDENT_AMBULATORY_CARE_PROVIDER_SITE_OTHER): Payer: Self-pay | Admitting: Family Medicine

## 2018-07-03 ENCOUNTER — Ambulatory Visit (INDEPENDENT_AMBULATORY_CARE_PROVIDER_SITE_OTHER): Payer: Medicare Other | Admitting: Family Medicine

## 2018-07-03 DIAGNOSIS — Z6841 Body Mass Index (BMI) 40.0 and over, adult: Secondary | ICD-10-CM

## 2018-07-03 DIAGNOSIS — I2510 Atherosclerotic heart disease of native coronary artery without angina pectoris: Secondary | ICD-10-CM

## 2018-07-03 DIAGNOSIS — E1165 Type 2 diabetes mellitus with hyperglycemia: Secondary | ICD-10-CM

## 2018-07-03 DIAGNOSIS — E559 Vitamin D deficiency, unspecified: Secondary | ICD-10-CM | POA: Diagnosis not present

## 2018-07-03 DIAGNOSIS — E66813 Obesity, class 3: Secondary | ICD-10-CM

## 2018-07-03 MED ORDER — PIOGLITAZONE HCL 45 MG PO TABS: 45.0000 mg | ORAL_TABLET | Freq: Every day | ORAL | 0 refills | Status: DC

## 2018-07-04 NOTE — Progress Notes (Signed)
Office: [phone]  /  Fax: [phone] TeleHealth Visit:  Zachary Michael has verbally consented to this TeleHealth visit today. The patient is located at home, the provider is located at the [facility] office. The participants in this visit include the listed provider and patient and patient's wife. The visit was conducted today via webex.  HPI:   Chief Complaint: OBESITY Zachary Michael is here to discuss his progress with his obesity treatment plan. He is on the keep a food journal with 1400-1600 calories and 100+ grams of protein daily and is following his eating plan approximately 90 % of the time. He states he is walking for 44 minutes 7 times per week. Zachary Michael is doing a good amount of walking daily. His average calories 136.8 calories and 95 grams of protein daily. He denies being hungry. He is sticking with the majority of the food that he is consistent with daily. He tends to get protein in earlier in the day and is very close to protein with breakfast and snack.  We were unable to weigh the patient today for this TeleHealth visit. He feels as if he has lost 1 lb since his last visit. He has lost 31-32 lbs since starting treatment with [name].  Diabetes II with Hyperglycemia Zachary Michael has a diagnosis of diabetes type II. Zachary Michael states fasting BGs range around 116 and post prandial range around 126. He denies hypoglycemia episodes. Last A1c was 6.4. He has been working on intensive lifestyle modifications including diet, exercise, and weight loss to help control his blood glucose levels.  Vitamin D Deficiency Zachary Michael has a diagnosis of vitamin D deficiency. He is currently taking OTC Vit D daily. He notes fatigue and denies nausea, vomiting or muscle weakness.  ASSESSMENT AND PLAN:  Type 2 diabetes mellitus with hyperglycemia, without long-term current use of insulin (HCC) - Plan: pioglitazone (ACTOS) 45 MG tablet  Vitamin D deficiency  Class 3 severe obesity with serious  comorbidity and body mass index (BMI) of 40.0 to 44.9 in adult, unspecified obesity type (HCC)  PLAN:  Diabetes II with Hyperglycemia Zachary Michael has been given extensive diabetes education by myself today including ideal fasting and post-prandial blood glucose readings, individual ideal Hgb A1c goals and hypoglycemia prevention. We discussed the importance of good blood sugar control to decrease the likelihood of diabetic complications such as nephropathy, neuropathy, limb loss, blindness, coronary artery disease, and death. We discussed the importance of intensive lifestyle modification including diet, exercise and weight loss as the first line treatment for diabetes. Zachary Michael agrees to continue taking Actos 45 mg 1 tablet PO daily #30 and we will refill for 1 month. Zachary Michael agrees to follow up with our clinic in 2 weeks.  Vitamin D Deficiency Zachary Michael was informed that low vitamin D levels contributes to fatigue and are associated with obesity, breast, and colon cancer. Trevione agrees to continue taking OTC Vit D daily and will follow up for routine testing of vitamin D, at least 2-3 times per year. He was informed of the risk of over-replacement of vitamin D and agrees to not increase his dose unless he discusses this with [name] first. Zachary Michael agrees to follow up with our clinic in 2 weeks.  Obesity Zachary Michael is currently in the action stage of change. As such, his goal is to continue with weight loss efforts He has agreed to keep a food journal with 1400-1600 calories and 100+ grams of protein daily Zachary Michael has been instructed to work up to a goal  of 150 minutes of combined cardio and strengthening exercise per week for weight loss and overall health benefits. We discussed the following Behavioral Modification Strategies today: increasing lean protein intake, increasing vegetables and work on meal planning and easy cooking plans, keeping healthy foods in the home, and planning for success    Zachary Michael has  agreed to follow up with our clinic in 2 weeks. He was informed of the importance of frequent follow up visits to maximize his success with intensive lifestyle modifications for his multiple health conditions.  ALLERGIES: No Known Allergies  MEDICATIONS: Current Outpatient Medications on File Prior to Visit  Medication Sig Dispense Refill  . albuterol (PROAIR HFA) 108 (90 BASE) MCG/ACT inhaler Inhale 2 puffs into the lungs every 6 (six) hours as needed for wheezing or shortness of breath. 1 Inhaler 4  . aspirin 81 MG tablet Take 81 mg by mouth daily.      [name] b complex vitamins tablet Take 1 tablet by mouth daily.    . calcium carbonate (CALCIUM 600) 600 MG TABS tablet Take 600 mg by mouth daily with breakfast.    . carvedilol (COREG) 6.25 MG tablet Take 1 tablet (6.25 mg total) by mouth 2 (two) times daily with a meal. 180 tablet 1  . Cinnamon 500 MG capsule Take 500 mg by mouth daily.    [name] co-enzyme Q-10 30 MG capsule Take 30 mg by mouth 3 (three) times daily.    . fenofibrate 160 MG tablet TAKE 1 TABLET BY MOUTH EVERY DAY 90 tablet 1  . ferrous sulfate 325 (65 FE) MG EC tablet Take 325 mg by mouth 3 (three) times daily with meals.    . fluticasone (FLONASE) 50 MCG/ACT nasal spray Place 2 sprays into both nostrils daily. 16 g 6  . glucose blood (ONETOUCH VERIO) test strip Use as instructed 100 each 0  . lisinopril (PRINIVIL,ZESTRIL) 10 MG tablet Take 1 tablet (10 mg total) by mouth daily. 90 tablet 1  . loratadine (CLARITIN) 10 MG tablet Take 1 tablet (10 mg total) by mouth daily. 30 tablet 11  . metFORMIN (GLUCOPHAGE) 1000 MG tablet Take 1 tablet (1,000 mg total) by mouth 2 (two) times daily with a meal. 60 tablet 0  . Multiple Vitamin (MULTIVITAMIN) capsule Take 1 capsule by mouth daily.    . nitroGLYCERIN (NITROSTAT) 0.4 MG SL tablet Place 1 tablet (0.4 mg total) under the tongue every 5 (five) minutes as needed for chest pain. 25 tablet 5  . simvastatin (ZOCOR) 40 MG tablet TAKE 1 TABLET  BY MOUTH EVERYDAY AT BEDTIME 90 tablet 1  . Zinc 100 MG TABS Take 1 tablet by mouth daily.     No current facility-administered medications on file prior to visit.     PAST MEDICAL HISTORY: Past Medical History:  Diagnosis Date  . Asthma   . Back pain   . CAD (coronary artery disease)    Mild  . Diabetes mellitus   . Heart valve problem   . HTN (hypertension)   . Hyperlipidemia   . Joint pain   . OSA (obstructive sleep apnea) 08/09/2017  . Palpitations   . SOB (shortness of breath)   . Subluxation    L2-3-4    PAST SURGICAL HISTORY: Past Surgical History:  Procedure Laterality Date  . APPENDECTOMY    . CARDIAC CATHETERIZATION     We recently performed a which revealed minor coronary artery irregularities. He had normal left ventricle systolic function with an EF of 60%  .  CARDIOVASCULAR STRESS TEST     EF 63%  . TONSILLECTOMY    . TUMOR REMOVAL     LEFT ARM    SOCIAL HISTORY: Social History   Tobacco Use  . Smoking status: Former Smoker    Years: 7.00    Types: Pipe    Last attempt to quit: 1980    Years since quitting: 40.3  . Smokeless tobacco: Never Used  Substance Use Topics  . Alcohol use: Yes    Comment: occassional  . Drug use: No    FAMILY HISTORY: Family History  Problem Relation Age of Onset  . Pneumonia Mother   . Osteoporosis Mother   . Arthritis Mother   . Diabetes Mother   . Hypertension Mother   . Cancer Father        leukemia, lung  . Cancer Brother   . Diabetes Maternal Aunt   . Hypertension Maternal Aunt   . Hypertension Maternal Aunt   . Lung cancer Unknown     ROS: Review of Systems  Constitutional: Positive for malaise/fatigue and weight loss.  Gastrointestinal: Negative for nausea and vomiting.  Musculoskeletal:       Negative muscle weakness  Endo/Heme/Allergies:       Negative hypoglycemia    PHYSICAL EXAM: Pt in no acute distress  RECENT LABS AND TESTS: BMET    Component Value Date/Time   NA 142 05/07/2018  1321   K 4.4 05/07/2018 1321   CL 104 05/07/2018 1321   CO2 22 05/07/2018 1321   GLUCOSE 129 (H) 05/07/2018 1321   GLUCOSE 117 (H) 11/13/2017 0938   BUN 19 05/07/2018 1321   CREATININE 0.78 05/07/2018 1321   CALCIUM 10.1 05/07/2018 1321   GFRNONAA 93 05/07/2018 1321   GFRAA 107 05/07/2018 1321   Lab Results  Component Value Date   HGBA1C 6.4 (H) 05/07/2018   HGBA1C 6.9 (H) 11/13/2017   HGBA1C 6.9 (H) 08/01/2017   HGBA1C 7.5 (H) 05/08/2017   HGBA1C 6.7 (H) 11/07/2016   Lab Results  Component Value Date   INSULIN 4.3 05/07/2018   INSULIN 7.9 01/24/2018   CBC    Component Value Date/Time   WBC 7.4 01/24/2018 1307   WBC 5.4 05/08/2017 1032   RBC 4.45 01/24/2018 1307   RBC 4.31 05/08/2017 1032   HGB 13.0 01/24/2018 1307   HCT 38.1 01/24/2018 1307   PLT 265 01/24/2018 1307   MCV 86 01/24/2018 1307   MCH 29.2 01/24/2018 1307   MCHC 34.1 01/24/2018 1307   MCHC 33.3 05/08/2017 1032   RDW 12.8 01/24/2018 1307   LYMPHSABS 1.2 01/24/2018 1307   MONOABS 0.5 05/08/2017 1032   EOSABS 0.4 01/24/2018 1307   BASOSABS 0.1 01/24/2018 1307   Iron/TIBC/Ferritin/ %Sat    Component Value Date/Time   IRON 102 05/08/2017 1032   IRON 102 05/08/2017 1032   FERRITIN 64.0 05/08/2017 1032   IRONPCTSAT 19.9 (L) 05/08/2017 1032   Lipid Panel     Component Value Date/Time   CHOL 148 05/07/2018 1321   TRIG 44 05/07/2018 1321   HDL 65 05/07/2018 1321   CHOLHDL 3.9 01/24/2018 1307   CHOLHDL 2 11/13/2017 0938   VLDL 11.4 11/13/2017 0938   LDLCALC 74 05/07/2018 1321   LDLDIRECT 198.6 03/20/2009 1127   Hepatic Function Panel     Component Value Date/Time   PROT 6.1 05/07/2018 1321   ALBUMIN 4.3 05/07/2018 1321   AST 14 05/07/2018 1321   ALT 12 05/07/2018 1321   ALKPHOS 37 (  L) 05/07/2018 1321   BILITOT 0.4 05/07/2018 1321   BILIDIR 0.1 08/14/2014 0824      Component Value Date/Time   TSH 1.050 01/24/2018 1307   TSH 1.28 11/07/2016 1008   TSH 0.98 09/27/2011 0945      I,  Burt KnackSharon Martin, am acting as transcriptionist for Debbra RidingAlexandria Kadolph, MD  I have reviewed the above documentation for accuracy and completeness, and I agree with the above. - Debbra RidingAlexandria Kadolph, MD

## 2018-07-18 ENCOUNTER — Other Ambulatory Visit: Payer: Self-pay

## 2018-07-18 ENCOUNTER — Ambulatory Visit (INDEPENDENT_AMBULATORY_CARE_PROVIDER_SITE_OTHER): Payer: Medicare Other | Admitting: Family Medicine

## 2018-07-18 ENCOUNTER — Encounter (INDEPENDENT_AMBULATORY_CARE_PROVIDER_SITE_OTHER): Payer: Self-pay | Admitting: Family Medicine

## 2018-07-18 DIAGNOSIS — I10 Essential (primary) hypertension: Secondary | ICD-10-CM | POA: Diagnosis not present

## 2018-07-18 DIAGNOSIS — I2510 Atherosclerotic heart disease of native coronary artery without angina pectoris: Secondary | ICD-10-CM

## 2018-07-18 DIAGNOSIS — E1165 Type 2 diabetes mellitus with hyperglycemia: Secondary | ICD-10-CM | POA: Diagnosis not present

## 2018-07-18 DIAGNOSIS — Z6839 Body mass index (BMI) 39.0-39.9, adult: Secondary | ICD-10-CM

## 2018-07-18 NOTE — Progress Notes (Signed)
Office: [phone]  /  Fax: [phone] TeleHealth Visit:  Zachary DroneStephen P Michael has verbally consented to this TeleHealth visit today. The patient is located at home, the provider is located at the [facility] office. The participants in this visit include the listed provider and patient and patient's wife. The visit was conducted today via skype.  HPI:   Chief Complaint: OBESITY Zachary Michael is here to discuss his progress with his obesity treatment plan. He is on the keep a food journal with 1400-1600 calories and 100+ grams of protein daily and is following his eating plan approximately 80 % of the time. He states he is walking for 30 minutes 7 times per week. Zachary Michael's weight is of 258 lbs this morning. He did eat off the plan when traveling to move his daughter out of her dorm room. He does not foresee any obstacles in the next few weeks.  We were unable to weigh the patient today for this TeleHealth visit. He feels as if he has lost 2 lbs since his last visit. He has lost 32-34 lbs since starting treatment with us.  Diabetes II with Hyperglycemia Zachary Michael has a diagnosis of diabetes type II. Zachary Michael is Actos and metformin. He denies side effects of his medications. Last A1c was 6.4. He states his fasting BGs average at 121. He denies hypoglycemia. He has been working on intensive lifestyle modifications including diet, exercise, and weight loss to help control his blood glucose levels.  Hypertension Zachary DroneStephen P Michael is a 68 y.o. male with hypertension. Zachary Michael's blood pressure is controlled. He denies chest pain, chest pressure, headaches, dizziness, or lightheadedness. His blood pressure was previously stable on medications when he was in the office. He has no blood pressure readings from home. He is working on weight loss to help control his blood pressure with the goal of decreasing his risk of heart attack and stroke.   ASSESSMENT AND PLAN:  Type 2 diabetes mellitus with  hyperglycemia, without long-term current use of insulin (HCC)  Essential hypertension  Class 2 severe obesity with serious comorbidity and body mass index (BMI) of 39.0 to 39.9 in adult, unspecified obesity type (HCC)  PLAN:  Diabetes II with Hyperglycemia Zachary Michael has been given extensive diabetes education by myself today including ideal fasting and post-prandial blood glucose readings, individual ideal Hgb A1c goals and hypoglycemia prevention. We discussed the importance of good blood sugar control to decrease the likelihood of diabetic complications such as nephropathy, neuropathy, limb loss, blindness, coronary artery disease, and death. We discussed the importance of intensive lifestyle modification including diet, exercise and weight loss as the first line treatment for diabetes. Zachary Michael agrees to cut Actos in half if his fasting BGs consistently stay in 100's (currently on 45 mg). Zachary Michael agrees to follow up with our clinic in 2 weeks.  Hypertension We discussed sodium restriction, working on healthy weight loss, and a regular exercise program as the means to achieve improved blood pressure control. Zachary Michael agreed with this plan and agreed to follow up as directed. We will continue to monitor his blood pressure as well as his progress with the above lifestyle modifications. Zachary Michael agrees to continue his current medications, no change in dosage and will watch for signs of hypotension as he continues his lifestyle modifications. Zachary Michael agrees to follow up with our clinic in 2 weeks.  Obesity Zachary Michael is currently in the action stage of change. As such, his goal is to continue with weight loss efforts He has agreed to  keep a food journal with 1450-1600 calories and 100+ grams of protein daily Zachary Michael has been instructed to work up to a goal of 150 minutes of combined cardio and strengthening exercise per week for weight loss and overall health benefits. We discussed the following Behavioral  Modification Strategies today: increasing lean protein intake, increasing vegetables and work on meal planning and easy cooking plans, keeping healthy foods in the home, and planning for success   Zachary Michael has agreed to follow up with our clinic in 2 weeks. He was informed of the importance of frequent follow up visits to maximize his success with intensive lifestyle modifications for his multiple health conditions.  ALLERGIES: No Known Allergies  MEDICATIONS: Current Outpatient Medications on File Prior to Visit  Medication Sig Dispense Refill  . albuterol (PROAIR HFA) 108 (90 BASE) MCG/ACT inhaler Inhale 2 puffs into the lungs every 6 (six) hours as needed for wheezing or shortness of breath. 1 Inhaler 4  . aspirin 81 MG tablet Take 81 mg by mouth daily.      [name] b complex vitamins tablet Take 1 tablet by mouth daily.    . calcium carbonate (CALCIUM 600) 600 MG TABS tablet Take 600 mg by mouth daily with breakfast.    . carvedilol (COREG) 6.25 MG tablet Take 1 tablet (6.25 mg total) by mouth 2 (two) times daily with a meal. 180 tablet 1  . Cinnamon 500 MG capsule Take 500 mg by mouth daily.    [name] co-enzyme Q-10 30 MG capsule Take 30 mg by mouth 3 (three) times daily.    . fenofibrate 160 MG tablet TAKE 1 TABLET BY MOUTH EVERY DAY 90 tablet 1  . ferrous sulfate 325 (65 FE) MG EC tablet Take 325 mg by mouth 3 (three) times daily with meals.    . fluticasone (FLONASE) 50 MCG/ACT nasal spray Place 2 sprays into both nostrils daily. 16 g 6  . glucose blood (ONETOUCH VERIO) test strip Use as instructed 100 each 0  . lisinopril (PRINIVIL,ZESTRIL) 10 MG tablet Take 1 tablet (10 mg total) by mouth daily. 90 tablet 1  . loratadine (CLARITIN) 10 MG tablet Take 1 tablet (10 mg total) by mouth daily. 30 tablet 11  . metFORMIN (GLUCOPHAGE) 1000 MG tablet Take 1 tablet (1,000 mg total) by mouth 2 (two) times daily with a meal. 60 tablet 0  . Multiple Vitamin (MULTIVITAMIN) capsule Take 1 capsule by mouth  daily.    . nitroGLYCERIN (NITROSTAT) 0.4 MG SL tablet Place 1 tablet (0.4 mg total) under the tongue every 5 (five) minutes as needed for chest pain. 25 tablet 5  . pioglitazone (ACTOS) 45 MG tablet Take 1 tablet (45 mg total) by mouth daily. 30 tablet 0  . simvastatin (ZOCOR) 40 MG tablet TAKE 1 TABLET BY MOUTH EVERYDAY AT BEDTIME 90 tablet 1  . Zinc 100 MG TABS Take 1 tablet by mouth daily.     No current facility-administered medications on file prior to visit.     PAST MEDICAL HISTORY: Past Medical History:  Diagnosis Date  . Asthma   . Back pain   . CAD (coronary artery disease)    Mild  . Diabetes mellitus   . Heart valve problem   . HTN (hypertension)   . Hyperlipidemia   . Joint pain   . OSA (obstructive sleep apnea) 08/09/2017  . Palpitations   . SOB (shortness of breath)   . Subluxation    L2-3-4    PAST SURGICAL HISTORY: Past  Surgical History:  Procedure Laterality Date  . APPENDECTOMY    . CARDIAC CATHETERIZATION     We recently performed a which revealed minor coronary artery irregularities. He had normal left ventricle systolic function with an EF of 60%  . CARDIOVASCULAR STRESS TEST     EF 63%  . TONSILLECTOMY    . TUMOR REMOVAL     LEFT ARM    SOCIAL HISTORY: Social History   Tobacco Use  . Smoking status: Former Smoker    Years: 7.00    Types: Pipe    Last attempt to quit: 1980    Years since quitting: 40.4  . Smokeless tobacco: Never Used  Substance Use Topics  . Alcohol use: Yes    Comment: occassional  . Drug use: No    FAMILY HISTORY: Family History  Problem Relation Age of Onset  . Pneumonia Mother   . Osteoporosis Mother   . Arthritis Mother   . Diabetes Mother   . Hypertension Mother   . Cancer Father        leukemia, lung  . Cancer Brother   . Diabetes Maternal Aunt   . Hypertension Maternal Aunt   . Hypertension Maternal Aunt   . Lung cancer Unknown     ROS: Review of Systems  Constitutional: Positive for weight  loss.  Cardiovascular: Negative for chest pain.       Negative chest pressure  Neurological: Negative for dizziness and headaches.       Negative lightheadedness  Endo/Heme/Allergies:       Negative hypoglycemia    PHYSICAL EXAM: Pt in no acute distress  RECENT LABS AND TESTS: BMET    Component Value Date/Time   NA 142 05/07/2018 1321   K 4.4 05/07/2018 1321   CL 104 05/07/2018 1321   CO2 22 05/07/2018 1321   GLUCOSE 129 (H) 05/07/2018 1321   GLUCOSE 117 (H) 11/13/2017 0938   BUN 19 05/07/2018 1321   CREATININE 0.78 05/07/2018 1321   CALCIUM 10.1 05/07/2018 1321   GFRNONAA 93 05/07/2018 1321   GFRAA 107 05/07/2018 1321   Lab Results  Component Value Date   HGBA1C 6.4 (H) 05/07/2018   HGBA1C 6.9 (H) 11/13/2017   HGBA1C 6.9 (H) 08/01/2017   HGBA1C 7.5 (H) 05/08/2017   HGBA1C 6.7 (H) 11/07/2016   Lab Results  Component Value Date   INSULIN 4.3 05/07/2018   INSULIN 7.9 01/24/2018   CBC    Component Value Date/Time   WBC 7.4 01/24/2018 1307   WBC 5.4 05/08/2017 1032   RBC 4.45 01/24/2018 1307   RBC 4.31 05/08/2017 1032   HGB 13.0 01/24/2018 1307   HCT 38.1 01/24/2018 1307   PLT 265 01/24/2018 1307   MCV 86 01/24/2018 1307   MCH 29.2 01/24/2018 1307   MCHC 34.1 01/24/2018 1307   MCHC 33.3 05/08/2017 1032   RDW 12.8 01/24/2018 1307   LYMPHSABS 1.2 01/24/2018 1307   MONOABS 0.5 05/08/2017 1032   EOSABS 0.4 01/24/2018 1307   BASOSABS 0.1 01/24/2018 1307   Iron/TIBC/Ferritin/ %Sat    Component Value Date/Time   IRON 102 05/08/2017 1032   IRON 102 05/08/2017 1032   FERRITIN 64.0 05/08/2017 1032   IRONPCTSAT 19.9 (L) 05/08/2017 1032   Lipid Panel     Component Value Date/Time   CHOL 148 05/07/2018 1321   TRIG 44 05/07/2018 1321   HDL 65 05/07/2018 1321   CHOLHDL 3.9 01/24/2018 1307   CHOLHDL 2 11/13/2017 0938   VLDL 11.4 11/13/2017  5732   LDLCALC 74 05/07/2018 1321   LDLDIRECT 198.6 03/20/2009 1127   Hepatic Function Panel     Component Value  Date/Time   PROT 6.1 05/07/2018 1321   ALBUMIN 4.3 05/07/2018 1321   AST 14 05/07/2018 1321   ALT 12 05/07/2018 1321   ALKPHOS 37 (L) 05/07/2018 1321   BILITOT 0.4 05/07/2018 1321   BILIDIR 0.1 08/14/2014 0824      Component Value Date/Time   TSH 1.050 01/24/2018 1307   TSH 1.28 11/07/2016 1008   TSH 0.98 09/27/2011 0945      I, Burt Knack, am acting as transcriptionist for Debbra Riding, MD  I have reviewed the above documentation for accuracy and completeness, and I agree with the above. - Debbra Riding, MD

## 2018-07-26 ENCOUNTER — Other Ambulatory Visit (INDEPENDENT_AMBULATORY_CARE_PROVIDER_SITE_OTHER): Payer: Self-pay | Admitting: Family Medicine

## 2018-07-26 DIAGNOSIS — E1165 Type 2 diabetes mellitus with hyperglycemia: Secondary | ICD-10-CM

## 2018-07-29 ENCOUNTER — Emergency Department: Payer: 59

## 2018-07-29 ENCOUNTER — Emergency Department
Admission: EM | Admit: 2018-07-29 | Discharge: 2018-07-29 | Disposition: A | Discharge status: Home / Self Care | Payer: 59 | Attending: Emergency Medicine | Admitting: Emergency Medicine

## 2018-07-29 DIAGNOSIS — R251 Tremor, unspecified: Secondary | ICD-10-CM | POA: Diagnosis not present

## 2018-07-29 DIAGNOSIS — R4182 Altered mental status, unspecified: Secondary | ICD-10-CM | POA: Diagnosis not present

## 2018-07-29 DIAGNOSIS — D649 Anemia, unspecified: Secondary | ICD-10-CM | POA: Diagnosis not present

## 2018-07-29 DIAGNOSIS — Z743 Need for continuous supervision: Secondary | ICD-10-CM | POA: Diagnosis not present

## 2018-07-29 DIAGNOSIS — R404 Transient alteration of awareness: Secondary | ICD-10-CM | POA: Diagnosis not present

## 2018-07-29 DIAGNOSIS — Z7984 Long term (current) use of oral hypoglycemic drugs: Secondary | ICD-10-CM | POA: Insufficient documentation

## 2018-07-29 DIAGNOSIS — G250 Essential tremor: Secondary | ICD-10-CM | POA: Insufficient documentation

## 2018-07-29 DIAGNOSIS — E11641 Type 2 diabetes mellitus with hypoglycemia with coma: Secondary | ICD-10-CM | POA: Insufficient documentation

## 2018-07-29 LAB — CBC AND DIFFERENTIAL
Absolute NRBC: 0 10*3/uL (ref 0.00–0.00)
Basophils Absolute Automated: 0.06 10*3/uL (ref 0.00–0.08)
Basophils Automated: 1 %
Eosinophils Absolute Automated: 0.26 10*3/uL (ref 0.00–0.44)
Eosinophils Automated: 4.1 %
Hematocrit: 36.7 % — ABNORMAL LOW (ref 37.6–49.6)
Hgb: 12.1 g/dL — ABNORMAL LOW (ref 12.5–17.1)
Immature Granulocytes Absolute: 0.02 10*3/uL (ref 0.00–0.07)
Immature Granulocytes: 0.3 %
Lymphocytes Absolute Automated: 1.2 10*3/uL (ref 0.42–3.22)
Lymphocytes Automated: 19.1 %
MCH: 28.9 pg (ref 25.1–33.5)
MCHC: 33 g/dL (ref 31.5–35.8)
MCV: 87.6 fL (ref 78.0–96.0)
MPV: 10.6 fL (ref 8.9–12.5)
Monocytes Absolute Automated: 0.58 10*3/uL (ref 0.21–0.85)
Monocytes: 9.3 %
Neutrophils Absolute: 4.15 10*3/uL (ref 1.10–6.33)
Neutrophils: 66.2 %
Nucleated RBC: 0 /100 WBC (ref 0.0–0.0)
Platelets: 186 10*3/uL (ref 142–346)
RBC: 4.19 10*6/uL — ABNORMAL LOW (ref 4.20–5.90)
RDW: 14 % (ref 11–15)
WBC: 6.27 10*3/uL (ref 3.10–9.50)

## 2018-07-29 LAB — GLUCOSE WHOLE BLOOD - POCT: Whole Blood Glucose POCT: 184 mg/dL — ABNORMAL HIGH (ref 70–100)

## 2018-07-29 LAB — COMPREHENSIVE METABOLIC PANEL
ALT: 15 U/L (ref 0–55)
AST (SGOT): 15 U/L (ref 5–34)
Albumin/Globulin Ratio: 1.8 (ref 0.9–2.2)
Albumin: 4 g/dL (ref 3.5–5.0)
Alkaline Phosphatase: 34 U/L — ABNORMAL LOW (ref 38–106)
Anion Gap: 12 (ref 5.0–15.0)
BUN: 16 mg/dL (ref 9.0–28.0)
Bilirubin, Total: 0.5 mg/dL (ref 0.2–1.2)
CO2: 20 mEq/L — ABNORMAL LOW (ref 22–29)
Calcium: 10 mg/dL (ref 8.5–10.5)
Chloride: 106 mEq/L (ref 100–111)
Creatinine: 1.1 mg/dL (ref 0.7–1.3)
Globulin: 2.2 g/dL (ref 2.0–3.6)
Glucose: 188 mg/dL — ABNORMAL HIGH (ref 70–100)
Potassium: 3.9 mEq/L (ref 3.5–5.1)
Protein, Total: 6.2 g/dL (ref 6.0–8.3)
Sodium: 138 mEq/L (ref 136–145)

## 2018-07-29 LAB — RAPID DRUG SCREEN, URINE
Barbiturate Screen, UR: NEGATIVE
Benzodiazepine Screen, UR: NEGATIVE
Cannabinoid Screen, UR: NEGATIVE
Cocaine, UR: NEGATIVE
Opiate Screen, UR: NEGATIVE
PCP Screen, UR: NEGATIVE
Urine Amphetamine Screen: NEGATIVE

## 2018-07-29 LAB — URINALYSIS, REFLEX TO MICROSCOPIC EXAM IF INDICATED
Bilirubin, UA: NEGATIVE
Blood, UA: NEGATIVE
Glucose, UA: NEGATIVE
Ketones UA: NEGATIVE
Leukocyte Esterase, UA: NEGATIVE
Nitrite, UA: NEGATIVE
Protein, UR: NEGATIVE
Specific Gravity UA: 1.017 (ref 1.001–1.035)
Urine pH: 7 (ref 5.0–8.0)
Urobilinogen, UA: NEGATIVE mg/dL (ref 0.2–2.0)

## 2018-07-29 LAB — HEMOLYSIS INDEX
Hemolysis Index: 10 (ref 0–18)
Hemolysis Index: 10 (ref 0–18)

## 2018-07-29 LAB — GFR: EGFR: >60

## 2018-07-29 LAB — CK: Creatine Kinase (CK): 57 U/L (ref 47–267)

## 2018-07-29 LAB — TROPONIN I: Troponin I: <0.01 ng/mL (ref 0.00–0.05)

## 2018-07-29 NOTE — Discharge Instructions (Signed)
Please eat small snacks/meals regularly. Follow up with your PCP & Neurologist when you return home.     Altered Mental Status, Resolved    You have been seen for "Altered Mental Status."    Altered Mental Status is the medical term for a change in a person's thinking. Symptoms can include confusion and amnesia (memory loss). They also include lethargy (low energy) or agitation (restless).    Altered mental status has many causes. Some more common causes are:   Infection (often urine and lung infections).   Stroke, transient ischemic attacks (TIAs). These are sometimes called mini-strokes.   Fever (temperature higher than 100.77F / 38C).   Reaction to medicine (this can happen if too much pain or sedative medicine is taken).   There may be other causes as well.    Your symptoms have gotten better or gone away completely. The doctor has evaluated you and thinks it is OK for you to go home. Someone responsible should be there to help you if you get ill or need help. Please alert the staff if you feel you will be unsafe in your home environment tonight!    YOU SHOULD SEEK MEDICAL ATTENTION IMMEDIATELY, EITHER HERE OR AT THE NEAREST EMERGENCY DEPARTMENT, IF ANY OF THE FOLLOWING OCCURS:   Confusion, coma, agitation (feeling restless).   Fever (temperature higher than 100.77F / 38C), vomiting.   Severe headache.   Signs of stroke, weakness.                 Anemia, Chronic    You have been seen for your chronic anemia (low blood count).    Anemia means "a low red blood cell count." Red blood cells are a part of your blood. These carry oxygen. Blood also has white blood cells, which fight infection and platelets, which help blood to clot.    Symptoms of anemia include fatigue (feeling tired) and weakness. Symptoms also include shortness of breath or chest pain with exercise or even normal activity. Another sign is pale color of the skin, lips and fingernail beds.    After an evaluation, the doctor thinks  your blood count IS NOT so low that you need a blood transfusion. Follow-up with your regular doctor for more rechecks on the blood count.    YOU SHOULD SEEK MEDICAL ATTENTION IMMEDIATELY, EITHER HERE OR AT THE NEAREST EMERGENCY DEPARTMENT, IF ANY OF THE FOLLOWING OCCURS:   You get light-headed and dizzy as if about to faint or have worsening shortness of breath and/or chest pain during normal activity like walking or climbing stairs.   You have any new sources of bleeding.       Tremors    You have been diagnosed with tremors.    A tremor is a shaking or twitching movement in any part of your body. It is most common in your arms, hands, legs, and head. A tremor is an involuntary movement. This means that you cant control or stop it. There are many things that cause tremors. Some tremors are normal. Some are a sign that something is wrong with your brain or nerves. Sometimes, only an experienced neurologist (brain and nerve doctor) can tell the difference.     Some of the different types of tremors are:     Resting tremors: These are tremors that happen when you are sitting or lying still. The body part that is shaking or twitching is supported by the chair, bed, or something else. This type of tremor usually  gets better or disappears when you move the affected body part. The most common cause of resting tremors is Parkinson's disease. Parkinsons disease is most often caused by a lack of a certain chemical, called dopamine, in the brain. It is a common cause of dementia (loss of memory and functioning) in old people. Another cause of resting tremors is a tumor or stroke that involves the middle part of your brain. Wilson's disease is also a cause of resting tremors. Wilsons disease is a genetic disease where your body moves copper to your brain, liver, eyes, and other organs.     Postural and action tremors: These are the most common type of tremors. A postural tremor is a tremor that happens when you  hold a body part still against the force of gravity. An action tremor is a tremor that happens when you move a body part. The causes of these 2 types of tremors are often the same. Postural and action tremors are not often caused by problems with the brain or nerves. Sometimes, though, they are an effect of brain tumors or strokes that involve the cerebellum. (The cerebellum is the part of the brain that controls balance.) They can also result from autoimmune diseases that affect the nervous system, such as multiple sclerosis. (In an autoimmune disease, the body is attacked by its own immune system.) These tremors can also happen because of liver failure.      Physiologic tremors: Tremors that happen while doing things that need detailed muscle movements, such as drawing or writing, are called physiologic tremors. This is a type of postural and action tremor. Most of the time, people dont notice psychologic tremors. The tremors can become more obvious, however, when they have certain medical problems, like thyroid disease. They can also be affected by caffeine and nicotine. Withdrawing from drugs and alcohol can also affect psychologic tremors. Most often, treating or removing the cause of the tremors will stop them.      Essential tremor: This is another type of postural and action tremor. Essential tremors are not dangerous, and they are not caused by any disease. This type of tremor gets worse when you hold out your arms still against the force of gravity. It also gets worse with intentional muscle movements. This type of tremor sometimes gets better with alcohol. The cause of essential tremors is not completely known, but some research has linked it to genetics.     Here are some of the things that a doctor or neurologist (brain and nerve doctor) might want to check. These are things that might mean something is wrong with your brain or nervous system:     A resting tremor. This is often the first sign  of Parkinsons disease.    A tremor that gets worse when you reach for something. This might mean there is something wrong with your cerebellum, such as a tumor or stroke.   A tremor that gets worse over time.    A tremor that happens at the same time as numbness, weakness, a feeling of unsteadiness, and blurry or double vision. This might mean that there is a problem with your central nervous system.    A tremor that starts suddenly.    A tremor that starts when you are younger than 32 when your family has no history of essential tremors.    You might need to have some more blood tests done. You might also need to have a CAT scan or MRI.  Follow up with your primary care doctor or neurologist (brain and nerve doctor).     If you have physiologic tremors, stay away from the things that cause them, such as anxiety, caffeine or nicotine.     Pay attention to your tremors to see if they are connected to other problems. Also, try to be aware if your tremors are getting better or worse over time.    Though we dont believe your condition is serious right now, it is important to be careful. Sometimes a problem that seems mild can become serious later. This is why it is very important that you return here or go to the nearest Emergency Department if you are not improving or your symptoms are getting worse.    YOU SHOULD SEEK MEDICAL ATTENTION IMMEDIATELY, EITHER HERE OR AT THE NEAREST EMERGENCY DEPARTMENT, IF ANY OF THE FOLLOWING OCCUR:     You pass out or feel lightheaded or dizzy.   You suddenly lose strength. You start to slur your speech. Your vision changes.    You feel off balance or unsteady on your feet.   You have a very painful headache.    If you can't follow up with your doctor, or if at any time you feel you need to be rechecked or seen again, come back here or go to the nearest emergency department.

## 2018-07-29 NOTE — ED Triage Notes (Addendum)
Pt came in by ambulance from car c/o weakness and altered mental status. EMS had difficulty arousing pt initially and became more responsive with a sternal rub. Pt states he is visiting from [place]. Pt states he "feels tired and weak".

## 2018-07-29 NOTE — ED Notes (Signed)
Bed: PU32  Expected date: 07/29/18  Expected time: 2:31 PM  Means of arrival: [id number]- Altha Harm  Comments:  [facility] [phone]

## 2018-07-29 NOTE — ED Provider Notes (Signed)
EMERGENCY DEPARTMENT HISTORY AND PHYSICAL EXAM     None        Date: 07/29/2018  Patient Name: Harry Perez      Personal Protective Equipment (PPE)    Face Shield, Gloves, N95, Procedure Gown and Surgical / Bouffant Cap     History of Presenting Illness     Dragon voice recognition system was used for dictation. This note may contain inadvertent typos.      Chief Complaint   Patient presents with    Altered Mental Status    Generalized weakness     Unable to obtain complete HPI, PMFSH, and ROS due to change in mental status.    History Provided By: Patient, Patient's wife, EMS    Chief Complaint: Fatigue, Generalized weakness    Additional History: Harry Perez is a 68 y.o. male h/o DM is BIBA c/o fatigue and generalized weakness PTA. Pt and his wife were coming back from their daughter's house, his wife was driving. He  fell asleep in the car he thinks from low blood sugar. He last ate yesterday afternoon. Per EMS, his blood sugar was 159, but he just had a soda. Pt and his wife recently come up from [facility]. Pt denies H/A, blurry or double vision, CP, SOB, trouble swallowing, vomiting, abd pain, dysuria, or extremity weakness. He has chronic tinnitus which is at baseline. 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 His tremors today are not similar to his baseline tremors. He also c/o earlier his arms hurting and felling numb and heavy. He is not on any sz medications.     Per pts wife, they did not eat breakfast today, and they were driving to get lunch after seeing their daughter for her birthday. Pt usually falls asleep in the car, but he usually wakes up when she opens the car door. Today he did not wake up when she opened the door, and she had difficulty waking him up. After he awoke, he asked for a soda. She states that he has a tremor in both hands, worse in the right.     PCP: Pcp, None, MD  SPECIALISTS:    No current facility-administered medications for  this encounter.      Current Outpatient Medications   Medication Sig Dispense Refill    metFORMIN (GLUCOPHAGE) 1000 MG tablet Take 1,000 mg by mouth 2 (two) times daily with meals           Past History     Reviewed Past Medical History, Surgical History, Family History and Social as documented.    Past Medical History:  Past Medical History:   Diagnosis Date    Asthma     Diabetes mellitus        Past Surgical History:  Past Surgical History:   Procedure Laterality Date    APPENDECTOMY      TONSILLECTOMY         Family History:  History reviewed. No pertinent family history.    Social History:  Social History     Tobacco Use    Smoking status: Never Smoker    Smokeless tobacco: Never Used   Substance Use Topics    Alcohol use: Yes     Comment: Once a month    Drug use: Never       Allergies:  No Known Allergies    Review of Systems     Review of Systems   Unable to perform  ROS: Mental status change   Constitutional: Positive for activity change (no PO intake since yesterday) and fatigue.   HENT: Positive for tinnitus (baseline). Negative for trouble swallowing.    Eyes: Negative for visual disturbance.   Respiratory: Negative for shortness of breath.    Cardiovascular: Negative for chest pain.   Gastrointestinal: Negative for abdominal pain and vomiting.   Genitourinary: Negative for dysuria.   Neurological: Positive for tremors, weakness (generalized) and numbness (bilateral arms). Negative for headaches.   Psychiatric/Behavioral: Positive for confusion.   All other systems reviewed and are negative.      Physical Exam   BP 122/60    Pulse 64    Temp 98.4 F (36.9 C) (Rectal)    Resp 22    Ht 5\' 9"  (1.753 m)    Wt 119.6 kg    SpO2 99%    BMI 38.94 kg/m     Physical Exam   Constitutional: He appears well-developed and well-nourished. No distress.   Elevated BMI   HENT:   Head: Normocephalic and atraumatic.   Eyes: Conjunctivae are normal. Right eye exhibits no discharge. Left eye exhibits no discharge.  No scleral icterus.   Neck: No JVD present. No tracheal deviation present.   Cardiovascular: Normal rate, regular rhythm, normal heart sounds and intact distal pulses.   No murmur heard.  2+ L radial pulse       Pulmonary/Chest: Effort normal and breath sounds normal. No stridor. No respiratory distress. He has no wheezes.   Abdominal: Soft. He exhibits no distension and no mass. There is no abdominal tenderness. There is no rebound and no guarding.   Rotund abd, not peritoneal   Musculoskeletal: Normal range of motion.         General: No tenderness or edema.   Neurological: He is alert. He displays tremor (resting & essential tremor b/l UE. ). He exhibits normal muscle tone. Coordination normal.   MAES   Skin: Skin is warm and dry. No rash noted. He is not diaphoretic.   Psychiatric: His speech is delayed. He is slowed.   Nursing note and vitals reviewed.      Diagnostic Study Results     Labs -     Results     Procedure Component Value Units Date/Time    Urine Tox Screen (Rapid Drug Screen) [[id number]] Collected:  07/29/18 1603    Specimen:  Urine Updated:  07/29/18 1645     Amphetamine Screen, UR Negative     Barbiturate Screen, UR Negative     Benzodiazepine Screen, UR Negative     Cannabinoid Screen, UR Negative     Cocaine, UR Negative     Opiate Screen, UR Negative     PCP Screen, UR Negative    UA, Reflex to Microscopic (pts 3 + yrs) [[id number]]  (Abnormal) Collected:  07/29/18 1603    Specimen:  Urine Updated:  07/29/18 1641     Urine Type Clean Catch     Color, UA Amber     Clarity, UA Sl Cloudy     Specific Gravity UA 1.017     Urine pH 7.0     Leukocyte Esterase, UA Negative     Nitrite, UA Negative     Protein, UR Negative     Glucose, UA Negative     Ketones UA Negative     Urobilinogen, UA Negative mg/dL      Bilirubin, UA Negative     Blood, UA Negative  Creatine Kinase (CK) [[id number]] Collected:  07/29/18 1508    Specimen:  Blood Updated:  07/29/18 1620     Creatine Kinase (CK) 57 U/L      Hemolysis index [[id number]] Collected:  07/29/18 1508     Updated:  07/29/18 1620     Hemolysis Index 10    Hemolysis index [[id number]] Collected:  07/29/18 1508     Updated:  07/29/18 1542     Hemolysis Index 10    GFR [[id number]] Collected:  07/29/18 1508     Updated:  07/29/18 1542     EGFR >60.0    Comprehensive metabolic panel [[id number]]  (Abnormal) Collected:  07/29/18 1508    Specimen:  Blood Updated:  07/29/18 1542     Glucose 188 mg/dL      BUN 24.4 mg/dL      Creatinine 1.1 mg/dL      Sodium 010 mEq/L      Potassium 3.9 mEq/L      Chloride 106 mEq/L      CO2 20 mEq/L      Calcium 10.0 mg/dL      Protein, Total 6.2 g/dL      Albumin 4.0 g/dL      AST (SGOT) 15 U/L      ALT 15 U/L      Alkaline Phosphatase 34 U/L      Bilirubin, Total 0.5 mg/dL      Globulin 2.2 g/dL      Albumin/Globulin Ratio 1.8     Anion Gap 12.0    Troponin I [[id number]] Collected:  07/29/18 1508    Specimen:  Blood Updated:  07/29/18 1542     Troponin I <0.01 ng/mL     CBC and differential [[id number]]  (Abnormal) Collected:  07/29/18 1508    Specimen:  Blood Updated:  07/29/18 1523     WBC 6.27 x10 3/uL      Hgb 12.1 g/dL      Hematocrit 63.8 %      Platelets 186 x10 3/uL      RBC 4.19 x10 6/uL      MCV 87.6 fL      MCH 28.9 pg      MCHC 33.0 g/dL      RDW 14 %      MPV 10.6 fL      Neutrophils 66.2 %      Lymphocytes Automated 19.1 %      Monocytes 9.3 %      Eosinophils Automated 4.1 %      Basophils Automated 1.0 %      Immature Granulocyte 0.3 %      Nucleated RBC 0.0 /100 WBC      Neutrophils Absolute 4.15 x10 3/uL      Abs Lymph Automated 1.20 x10 3/uL      Abs Mono Automated 0.58 x10 3/uL      Abs Eos Automated 0.26 x10 3/uL      Absolute Baso Automated 0.06 x10 3/uL      Absolute Immature Granulocyte 0.02 x10 3/uL      Absolute NRBC 0.00 x10 3/uL     Glucose Whole Blood - POCT [[id number]]  (Abnormal) Collected:  07/29/18 1453     Updated:  07/29/18 1454     POCT - Glucose Whole blood 184 mg/dL           Radiologic Studies -    Radiology Results (24 Hour)     Procedure Component Value Units Date/Time  Chest AP Portable [[id number]] Collected:  07/29/18 1657    Order Status:  Completed Updated:  07/29/18 1702    Narrative:       XR CHEST AP PORTABLE    CLINICAL INDICATION:   change in  mental status r/o PNA    COMPARISON: None    FINDINGS: The cardiomediastinal silhouette appears within normal size  limits for portable technique and patient positioning. There is no  evidence for focal airspace consolidation, pleural effusion, or  pneumothorax. The pulmonary vascularity appears unremarkable.      Impression:        No acute pulmonary or pleural disease.    Sandie Ano, MD   07/29/2018 4:58 PM    CT Head without Contrast [[id number]] Collected:  07/29/18 1546    Order Status:  Completed Updated:  07/29/18 1554    Narrative:       CT HEAD    CLINICAL STATEMENT: Altered mental status and generalized weakness.    COMPARISON: No prior studies are available for comparison.     TECHNIQUE: Multiple 5 mm thickness axial images were obtained from the  skull base to the vertex without IV contrast administration. Coronal and  sagittal reformatted images were then obtained.    Note that CT scanning at this site utilizes multiple dose reduction  techniques including automatic exposure control, adjustment of the MAS  and/or KVP according to patient size, and use of iterative  reconstruction technique.    FINDINGS: Gray-white matter junction differentiation is appropriate. No  intracranial hemorrhage is identified. The ventricles are appropriate in  size, shape and are midline. There is no intracranial mass, mass-effect  or midline shift. No abnormal intra or extra-axial fluid collections are  identified.     The visualized paranasal sinuses and mastoid air cells are well-aerated.  The osseous calvarium is intact.      Impression:         No CT evidence of an acute intracranial abnormality.    Aldean Ast, MD   07/29/2018 3:50 PM      .    Medical Decision  Making   I am the first provider for this patient.    I reviewed the vital signs, available nursing notes, past medical history, past surgical history, family history and social history.    Vital Signs-Reviewed the patient's vital signs.  I reviewed pt's pulse oxymetry and cardiac monitor values, as relevant to the case.     Patient Vitals for the past 12 hrs:   BP Temp Pulse Resp   07/29/18 1630 122/60  64 22   07/29/18 1600 158/76  (!) 106 (!) 24   07/29/18 1530  98.4 F (36.9 C)     07/29/18 1452 124/78 98.2 F (36.8 C) 68 (!) 26       Cardiac Monitor:  Rate: 64  Rhythm:  Normal Sinus Rhythm     EKG:  Interpreted by the EP.   Time Interpreted: 3:05p   Rate: 72   Rhythm: Normal Sinus Rhythm    Interpretation: BAE, TWI V1, elevated J point V3-4, LAD   Comparison: No prior study is available for comparison.    Old Medical Records: Nursing notes.     ED Course:  ED Course as of Jul 28 1713   Sun Jul 29, 2018   1705 Feels better. More awake, will ambulate & check RA sats    [LL]   1708 Ambulated w/ steady gait w/o assistance. Feels good. D/C home, f/u PCP    [  LL]      ED Course User Index  [LL] Annett Fabian, MD       ED Medications given:   Medications - No data to display     Medical Decision Making: Patient is a diabetic and did not eat breakfast today.  Patient is visiting from [place] to see his daughter because it is her birthday.  Wife reports patient was sleeping in the car when she went to go get lunch.  Normally he wakes up when she opens the door, but he did not wake up.  Wife tried to arouse patient and he did not wake up, he finally woke up and asked for a soda.  Patient still appears very groggy.  Nonfocal neuro exam except for resting and essential tremors.  Labs, rule electrolyte abnormality, d/w wife, reassess.    Diagnosis     Clinical Impression:   1. Transient alteration of awareness    2. Normocytic anemia    3. Tremor        Treatment Plan:   ED Disposition     ED Disposition Condition  Date/Time Comment    Discharge  Sun Jul 29, 2018  5:14 PM Harry Perez discharge to home/self care.    Condition at disposition: Stable            _______________________________    *This note was generated by the Epic EMR system/ Dragon speech recognition and may contain inherent errors or omissions not intended by the user. Grammatical errors, random word insertions, deletions, pronoun errors and incomplete sentences are occasional consequences of this technology due to software limitations. Not all errors are caught or corrected. If there are questions or concerns about the content of this note or information contained within the body of this dictation they should be addressed directly with the author for clarification.     Annett Fabian, MD  08/01/18 2111

## 2018-07-30 ENCOUNTER — Ambulatory Visit (HOSPITAL_COMMUNITY): Payer: Medicare Other

## 2018-07-30 ENCOUNTER — Encounter (INDEPENDENT_AMBULATORY_CARE_PROVIDER_SITE_OTHER): Payer: Self-pay | Admitting: Family Medicine

## 2018-07-30 LAB — ECG 12-LEAD
Atrial Rate: 72 {beats}/min
P Axis: 41 degrees
P-R Interval: 176 ms
Q-T Interval: 404 ms
QRS Duration: 106 ms
QTC Calculation (Bezet): 442 ms
R Axis: -58 degrees
T Axis: 57 degrees
Ventricular Rate: 72 {beats}/min

## 2018-07-31 NOTE — Telephone Encounter (Signed)
Please review

## 2018-08-02 ENCOUNTER — Encounter (INDEPENDENT_AMBULATORY_CARE_PROVIDER_SITE_OTHER): Payer: Self-pay | Admitting: Family Medicine

## 2018-08-02 ENCOUNTER — Ambulatory Visit (INDEPENDENT_AMBULATORY_CARE_PROVIDER_SITE_OTHER): Payer: Medicare Other | Admitting: Family Medicine

## 2018-08-02 ENCOUNTER — Other Ambulatory Visit: Payer: Self-pay

## 2018-08-02 DIAGNOSIS — Z6839 Body mass index (BMI) 39.0-39.9, adult: Secondary | ICD-10-CM

## 2018-08-02 DIAGNOSIS — E7849 Other hyperlipidemia: Secondary | ICD-10-CM | POA: Diagnosis not present

## 2018-08-02 DIAGNOSIS — E1165 Type 2 diabetes mellitus with hyperglycemia: Secondary | ICD-10-CM

## 2018-08-02 DIAGNOSIS — I2510 Atherosclerotic heart disease of native coronary artery without angina pectoris: Secondary | ICD-10-CM

## 2018-08-02 DIAGNOSIS — D649 Anemia, unspecified: Secondary | ICD-10-CM

## 2018-08-02 NOTE — Progress Notes (Signed)
Office: [phone]  /  Fax: [phone] TeleHealth Visit:  Zachary Michael has verbally consented to this TeleHealth visit today. The patient is located at home, the provider is located at the [facility] office. The participants in this visit include the listed provider and patient and patient's wife. The visit was conducted today via skype.  HPI:   Chief Complaint: OBESITY Zachary Michael is here to discuss his progress with his obesity treatment plan. He is on the Category 3 plan and is following his eating plan approximately 75 % of the time. He states he is walking for 45 minutes daily. Zachary Michael's weight is of 259 lbs today. He notes an episode of hypoglycemia that led to an emergency room visit. He voices that since that visit he has taken it easy in terms of physical activity, and would like to get back to activity and Category 3 plan. We were unable to weigh the patient today for this TeleHealth visit. He feels as if he has lost 5 lbs since his last visit. He has lost 34-39 lbs since starting treatment with [name].  Diabetes II with Hyperglycemia Zachary Michael has a diagnosis of diabetes type II. Zachary Michael states he had 1 episode os arousal due to hypoglycemia. He states his fasting BGs average around 110's. Last A1c was 6.4. He has been working on intensive lifestyle modifications including diet, exercise, and weight loss to help control his blood glucose levels.  Hyperlipidemia Zachary Michael has hyperlipidemia and has been trying to improve his cholesterol levels with intensive lifestyle modification including a low saturated fat diet, exercise and weight loss. Last LDL was of 74 and HDL of 65. He denies any chest pain, claudication or myalgias.  Normocytic Anemia Zachary Michael has a diagnosis of anemia. He had a CBC done at his emergency room visit. His Hg/HCT was 12.1/36.7, and MCV of 87.6. He notes is taking iron supplementation daily.   ASSESSMENT AND PLAN:  Type 2 diabetes mellitus with  hyperglycemia, without long-term current use of insulin (HCC)  Other hyperlipidemia  Normocytic anemia  Class 2 severe obesity with serious comorbidity and body mass index (BMI) of 39.0 to 39.9 in adult, unspecified obesity type (HCC)  PLAN:  Diabetes II with Hyperglycemia Zachary Michael has been given extensive diabetes education by myself today including ideal fasting and post-prandial blood glucose readings, individual ideal Hgb A1c goals and hypoglycemia prevention. We discussed the importance of good blood sugar control to decrease the likelihood of diabetic complications such as nephropathy, neuropathy, limb loss, blindness, coronary artery disease, and death. We discussed the importance of intensive lifestyle modification including diet, exercise and weight loss as the first line treatment for diabetes. Zachary Michael agrees to cut his Actos in half, and plan to stop Actos at next appointment. We will repeat CBC at his first in person appointment. Zachary Michael agrees to follow up with our clinic in 2 weeks.   Hyperlipidemia Zachary Michael was informed of the American Heart Association Guidelines emphasizing intensive lifestyle modifications as the first line treatment for hyperlipidemia. We discussed many lifestyle modifications today in depth, and Zachary Michael will continue to work on decreasing saturated fats such as fatty red meat, butter and many fried foods. He will also increase vegetables and lean protein in his diet and continue to work on exercise and weight loss efforts. We will repeat CBC at his first in person appointment. Zachary Michael agrees to follow up with our clinic in 2 weeks.  Normocytic Anemia The diagnosis of normocytic anemia was discussed with Zachary Michael and  was explained in detail. He was given suggestions of iron rich foods and he will continue taking his iron supplementtation. We will repeat CBC at his first in person appointment. Zachary Michael agrees to follow up with our clinic in 2 weeks.  Obesity Zachary Michael  is currently in the action stage of change. As such, his goal is to continue with weight loss efforts He has agreed to follow the Category 3 plan Zachary Michael has been instructed to work up to a goal of 150 minutes of combined cardio and strengthening exercise per week for weight loss and overall health benefits. We discussed the following Behavioral Modification Strategies today: increasing lean protein intake, increasing vegetables and work on meal planning and easy cooking plans, no skipping meals, keeping healthy foods in the home, and planning for success   Zachary Michael has agreed to follow up with our clinic in 2 weeks. He was informed of the importance of frequent follow up visits to maximize his success with intensive lifestyle modifications for his multiple health conditions.  ALLERGIES: No Known Allergies  MEDICATIONS: Current Outpatient Medications on File Prior to Visit  Medication Sig Dispense Refill  . albuterol (PROAIR HFA) 108 (90 BASE) MCG/ACT inhaler Inhale 2 puffs into the lungs every 6 (six) hours as needed for wheezing or shortness of breath. 1 Inhaler 4  . aspirin 81 MG tablet Take 81 mg by mouth daily.      [name]. b complex vitamins tablet Take 1 tablet by mouth daily.    . calcium carbonate (CALCIUM 600) 600 MG TABS tablet Take 600 mg by mouth daily with breakfast.    . carvedilol (COREG) 6.25 MG tablet Take 1 tablet (6.25 mg total) by mouth 2 (two) times daily with a meal. 180 tablet 1  . Cinnamon 500 MG capsule Take 500 mg by mouth daily.    [name]. co-enzyme Q-10 30 MG capsule Take 30 mg by mouth 3 (three) times daily.    . fenofibrate 160 MG tablet TAKE 1 TABLET BY MOUTH EVERY DAY 90 tablet 1  . ferrous sulfate 325 (65 FE) MG EC tablet Take 325 mg by mouth 3 (three) times daily with meals.    . fluticasone (FLONASE) 50 MCG/ACT nasal spray Place 2 sprays into both nostrils daily. 16 g 6  . glucose blood (ONETOUCH VERIO) test strip Use as instructed 100 each 0  . lisinopril  (PRINIVIL,ZESTRIL) 10 MG tablet Take 1 tablet (10 mg total) by mouth daily. 90 tablet 1  . loratadine (CLARITIN) 10 MG tablet Take 1 tablet (10 mg total) by mouth daily. 30 tablet 11  . metFORMIN (GLUCOPHAGE) 1000 MG tablet Take 1 tablet (1,000 mg total) by mouth 2 (two) times daily with a meal. 60 tablet 0  . Multiple Vitamin (MULTIVITAMIN) capsule Take 1 capsule by mouth daily.    . nitroGLYCERIN (NITROSTAT) 0.4 MG SL tablet Place 1 tablet (0.4 mg total) under the tongue every 5 (five) minutes as needed for chest pain. 25 tablet 5  . pioglitazone (ACTOS) 45 MG tablet Take 1 tablet (45 mg total) by mouth daily. 30 tablet 0  . simvastatin (ZOCOR) 40 MG tablet TAKE 1 TABLET BY MOUTH EVERYDAY AT BEDTIME 90 tablet 1  . Zinc 100 MG TABS Take 1 tablet by mouth daily.     No current facility-administered medications on file prior to visit.     PAST MEDICAL HISTORY: Past Medical History:  Diagnosis Date  . Asthma   . Back pain   . CAD (coronary artery  disease)    Mild  . Diabetes mellitus   . Heart valve problem   . HTN (hypertension)   . Hyperlipidemia   . Joint pain   . OSA (obstructive sleep apnea) 08/09/2017  . Palpitations   . SOB (shortness of breath)   . Subluxation    L2-3-4    PAST SURGICAL HISTORY: Past Surgical History:  Procedure Laterality Date  . APPENDECTOMY    . CARDIAC CATHETERIZATION     We recently performed a which revealed minor coronary artery irregularities. He had normal left ventricle systolic function with an EF of 60%  . CARDIOVASCULAR STRESS TEST     EF 63%  . TONSILLECTOMY    . TUMOR REMOVAL     LEFT ARM    SOCIAL HISTORY: Social History   Tobacco Use  . Smoking status: Former Smoker    Years: 7.00    Types: Pipe    Last attempt to quit: 1980    Years since quitting: 40.4  . Smokeless tobacco: Never Used  Substance Use Topics  . Alcohol use: Yes    Comment: occassional  . Drug use: No    FAMILY HISTORY: Family History  Problem  Relation Age of Onset  . Pneumonia Mother   . Osteoporosis Mother   . Arthritis Mother   . Diabetes Mother   . Hypertension Mother   . Cancer Father        leukemia, lung  . Cancer Brother   . Diabetes Maternal Aunt   . Hypertension Maternal Aunt   . Hypertension Maternal Aunt   . Lung cancer Unknown     ROS: Review of Systems  Constitutional: Positive for weight loss.  Cardiovascular: Negative for chest pain and claudication.  Musculoskeletal: Negative for myalgias.  Endo/Heme/Allergies:       Negative hyperglycemia    PHYSICAL EXAM: Pt in no acute distress  RECENT LABS AND TESTS: BMET    Component Value Date/Time   NA 142 05/07/2018 1321   K 4.4 05/07/2018 1321   CL 104 05/07/2018 1321   CO2 22 05/07/2018 1321   GLUCOSE 129 (H) 05/07/2018 1321   GLUCOSE 117 (H) 11/13/2017 0938   BUN 19 05/07/2018 1321   CREATININE 0.78 05/07/2018 1321   CALCIUM 10.1 05/07/2018 1321   GFRNONAA 93 05/07/2018 1321   GFRAA 107 05/07/2018 1321   Lab Results  Component Value Date   HGBA1C 6.4 (H) 05/07/2018   HGBA1C 6.9 (H) 11/13/2017   HGBA1C 6.9 (H) 08/01/2017   HGBA1C 7.5 (H) 05/08/2017   HGBA1C 6.7 (H) 11/07/2016   Lab Results  Component Value Date   INSULIN 4.3 05/07/2018   INSULIN 7.9 01/24/2018   CBC    Component Value Date/Time   WBC 7.4 01/24/2018 1307   WBC 5.4 05/08/2017 1032   RBC 4.45 01/24/2018 1307   RBC 4.31 05/08/2017 1032   HGB 13.0 01/24/2018 1307   HCT 38.1 01/24/2018 1307   PLT 265 01/24/2018 1307   MCV 86 01/24/2018 1307   MCH 29.2 01/24/2018 1307   MCHC 34.1 01/24/2018 1307   MCHC 33.3 05/08/2017 1032   RDW 12.8 01/24/2018 1307   LYMPHSABS 1.2 01/24/2018 1307   MONOABS 0.5 05/08/2017 1032   EOSABS 0.4 01/24/2018 1307   BASOSABS 0.1 01/24/2018 1307   Iron/TIBC/Ferritin/ %Sat    Component Value Date/Time   IRON 102 05/08/2017 1032   IRON 102 05/08/2017 1032   FERRITIN 64.0 05/08/2017 1032   IRONPCTSAT 19.9 (L) 05/08/2017 1032  Lipid  Panel     Component Value Date/Time   CHOL 148 05/07/2018 1321   TRIG 44 05/07/2018 1321   HDL 65 05/07/2018 1321   CHOLHDL 3.9 01/24/2018 1307   CHOLHDL 2 11/13/2017 0938   VLDL 11.4 11/13/2017 0938   LDLCALC 74 05/07/2018 1321   LDLDIRECT 198.6 03/20/2009 1127   Hepatic Function Panel     Component Value Date/Time   PROT 6.1 05/07/2018 1321   ALBUMIN 4.3 05/07/2018 1321   AST 14 05/07/2018 1321   ALT 12 05/07/2018 1321   ALKPHOS 37 (L) 05/07/2018 1321   BILITOT 0.4 05/07/2018 1321   BILIDIR 0.1 08/14/2014 0824      Component Value Date/Time   TSH 1.050 01/24/2018 1307   TSH 1.28 11/07/2016 1008   TSH 0.98 09/27/2011 0945      I, Burt Knack, am acting as transcriptionist for Debbra Riding, MD  I have reviewed the above documentation for accuracy and completeness, and I agree with the above. - Debbra Riding, MD

## 2018-08-16 ENCOUNTER — Ambulatory Visit (INDEPENDENT_AMBULATORY_CARE_PROVIDER_SITE_OTHER): Payer: Medicare Other | Admitting: Family Medicine

## 2018-08-16 ENCOUNTER — Encounter (INDEPENDENT_AMBULATORY_CARE_PROVIDER_SITE_OTHER): Payer: Self-pay | Admitting: Family Medicine

## 2018-08-16 ENCOUNTER — Other Ambulatory Visit: Payer: Self-pay

## 2018-08-16 DIAGNOSIS — I2510 Atherosclerotic heart disease of native coronary artery without angina pectoris: Secondary | ICD-10-CM

## 2018-08-16 DIAGNOSIS — I10 Essential (primary) hypertension: Secondary | ICD-10-CM

## 2018-08-16 DIAGNOSIS — E1165 Type 2 diabetes mellitus with hyperglycemia: Secondary | ICD-10-CM | POA: Diagnosis not present

## 2018-08-16 DIAGNOSIS — Z6839 Body mass index (BMI) 39.0-39.9, adult: Secondary | ICD-10-CM

## 2018-08-20 NOTE — Progress Notes (Signed)
Office: [phone]  /  Fax: [phone] TeleHealth Visit:  Zachary Michael has verbally consented to this TeleHealth visit today. The patient is located at home, the provider is located at the [facility] office. The participants in this visit include the listed provider and patient and patient's wife. The visit was conducted today via webex.  HPI:   Chief Complaint: OBESITY Zachary Michael is here to discuss his progress with his obesity treatment plan. He is on the Category 3 plan and is following his eating plan approximately 90-95 % of the time. He states he is walking for 45 minutes 7 times per week, and doing yard work for 60-100 minutes 3-4 times per week. Zachary Michael has been trying to be mindful of timing of food and not going long stretches without eating. He is drinking significant amounts of water. He has no trips planned. He is planning to continue the same meal plan. His weight is of 255 lbs today.  We were unable to weigh the patient today for this TeleHealth visit. He feels as if he has lost 4 lbs since his last visit. He has lost 39 lbs since starting treatment with us.  Diabetes II with Hyperglycemia Zachary Michael has a diagnosis of diabetes type II. Zachary Michael states his fasting BGs averaging at 122. He stopped Actos 4 days agoo secondary to running out. His highest BGs is 143. He denies hypoglycemia or carbohydrate cravings. Last A1c was 6.4. He has been working on intensive lifestyle modifications including diet, exercise, and weight loss to help control his blood glucose levels.  Hypertension Zachary Michael is a 68 y.o. male with hypertension. Zachary Michael's blood pressure is well controlled. He denies chest pain, chest pressure, or headaches. He is working on weight loss to help control his blood pressure with the goal of decreasing his risk of heart attack and stroke.   ASSESSMENT AND PLAN:  Type 2 diabetes mellitus with hyperglycemia, without long-term current use of insulin  (HCC)  Essential hypertension  Class 2 severe obesity with serious comorbidity and body mass index (BMI) of 39.0 to 39.9 in adult, unspecified obesity type (HCC)  PLAN:  Diabetes II with Hyperglycemia Zachary Michael has been given extensive diabetes education by myself today including ideal fasting and post-prandial blood glucose readings, individual ideal Hgb A1c goals and hypoglycemia prevention. We discussed the importance of good blood sugar control to decrease the likelihood of diabetic complications such as nephropathy, neuropathy, limb loss, blindness, coronary artery disease, and death. We discussed the importance of intensive lifestyle modification including diet, exercise and weight loss as the first line treatment for diabetes. Zachary Michael is to stop Actos, he is now lonely on 1,000 mg metformin BID. Zachary Michael agrees to follow up with our clinic in 2 weeks.  Hypertension We discussed sodium restriction, working on healthy weight loss, and a regular exercise program as the means to achieve improved blood pressure control. Zachary Michael agreed with this plan and agreed to follow up as directed. We will continue to monitor his blood pressure as well as his progress with the above lifestyle modifications. Zachary Michael agrees to continue his current medications and will watch for signs of hypotension as he continues his lifestyle modifications. Zachary Michael agrees to follow up with our clinic in 2 weeks.  Obesity Zachary Michael is currently in the action stage of change. As such, his goal is to continue with weight loss efforts He has agreed to follow the Category 3 plan Zachary Michael has been instructed to work up to a goal  of 150 minutes of combined cardio and strengthening exercise per week for weight loss and overall health benefits. We discussed the following Behavioral Modification Strategies today: increasing lean protein intake, increasing vegetables and work on meal planning and easy cooking plans, and keeping healthy foods  in the home   Zachary Michael has agreed to follow up with our clinic in 2 weeks. He was informed of the importance of frequent follow up visits to maximize his success with intensive lifestyle modifications for his multiple health conditions.  ALLERGIES: No Known Allergies  MEDICATIONS: Current Outpatient Medications on File Prior to Visit  Medication Sig Dispense Refill  . albuterol (PROAIR HFA) 108 (90 BASE) MCG/ACT inhaler Inhale 2 puffs into the lungs every 6 (six) hours as needed for wheezing or shortness of breath. 1 Inhaler 4  . aspirin 81 MG tablet Take 81 mg by mouth daily.      [name]. b complex vitamins tablet Take 1 tablet by mouth daily.    . calcium carbonate (CALCIUM 600) 600 MG TABS tablet Take 600 mg by mouth daily with breakfast.    . carvedilol (COREG) 6.25 MG tablet Take 1 tablet (6.25 mg total) by mouth 2 (two) times daily with a meal. 180 tablet 1  . Cinnamon 500 MG capsule Take 500 mg by mouth daily.    [name]. co-enzyme Q-10 30 MG capsule Take 30 mg by mouth 3 (three) times daily.    . fenofibrate 160 MG tablet TAKE 1 TABLET BY MOUTH EVERY DAY 90 tablet 1  . ferrous sulfate 325 (65 FE) MG EC tablet Take 325 mg by mouth 3 (three) times daily with meals.    . fluticasone (FLONASE) 50 MCG/ACT nasal spray Place 2 sprays into both nostrils daily. 16 g 6  . glucose blood (ONETOUCH VERIO) test strip Use as instructed 100 each 0  . lisinopril (PRINIVIL,ZESTRIL) 10 MG tablet Take 1 tablet (10 mg total) by mouth daily. 90 tablet 1  . loratadine (CLARITIN) 10 MG tablet Take 1 tablet (10 mg total) by mouth daily. 30 tablet 11  . metFORMIN (GLUCOPHAGE) 1000 MG tablet Take 1 tablet (1,000 mg total) by mouth 2 (two) times daily with a meal. 60 tablet 0  . Multiple Vitamin (MULTIVITAMIN) capsule Take 1 capsule by mouth daily.    . nitroGLYCERIN (NITROSTAT) 0.4 MG SL tablet Place 1 tablet (0.4 mg total) under the tongue every 5 (five) minutes as needed for chest pain. 25 tablet 5  . simvastatin  (ZOCOR) 40 MG tablet TAKE 1 TABLET BY MOUTH EVERYDAY AT BEDTIME 90 tablet 1  . Zinc 100 MG TABS Take 1 tablet by mouth daily.     No current facility-administered medications on file prior to visit.     PAST MEDICAL HISTORY: Past Medical History:  Diagnosis Date  . Asthma   . Back pain   . CAD (coronary artery disease)    Mild  . Diabetes mellitus   . Heart valve problem   . HTN (hypertension)   . Hyperlipidemia   . Joint pain   . OSA (obstructive sleep apnea) 08/09/2017  . Palpitations   . SOB (shortness of breath)   . Subluxation    L2-3-4    PAST SURGICAL HISTORY: Past Surgical History:  Procedure Laterality Date  . APPENDECTOMY    . CARDIAC CATHETERIZATION     We recently performed a which revealed minor coronary artery irregularities. He had normal left ventricle systolic function with an EF of 60%  . CARDIOVASCULAR STRESS  TEST     EF 63%  . TONSILLECTOMY    . TUMOR REMOVAL     LEFT ARM    SOCIAL HISTORY: Social History   Tobacco Use  . Smoking status: Former Smoker    Years: 7.00    Types: Pipe    Quit date: 1980    Years since quitting: 40.5  . Smokeless tobacco: Never Used  Substance Use Topics  . Alcohol use: Yes    Comment: occassional  . Drug use: No    FAMILY HISTORY: Family History  Problem Relation Age of Onset  . Pneumonia Mother   . Osteoporosis Mother   . Arthritis Mother   . Diabetes Mother   . Hypertension Mother   . Cancer Father        leukemia, lung  . Cancer Brother   . Diabetes Maternal Aunt   . Hypertension Maternal Aunt   . Hypertension Maternal Aunt   . Lung cancer Unknown     ROS: Review of Systems  Constitutional: Positive for weight loss.  Cardiovascular: Negative for chest pain.       Negative chest pressure  Neurological: Negative for headaches.  Endo/Heme/Allergies:       Negative hypoglycemia    PHYSICAL EXAM: Pt in no acute distress  RECENT LABS AND TESTS: BMET    Component Value Date/Time   NA  142 05/07/2018 1321   K 4.4 05/07/2018 1321   CL 104 05/07/2018 1321   CO2 22 05/07/2018 1321   GLUCOSE 129 (H) 05/07/2018 1321   GLUCOSE 117 (H) 11/13/2017 0938   BUN 19 05/07/2018 1321   CREATININE 0.78 05/07/2018 1321   CALCIUM 10.1 05/07/2018 1321   GFRNONAA 93 05/07/2018 1321   GFRAA 107 05/07/2018 1321   Lab Results  Component Value Date   HGBA1C 6.4 (H) 05/07/2018   HGBA1C 6.9 (H) 11/13/2017   HGBA1C 6.9 (H) 08/01/2017   HGBA1C 7.5 (H) 05/08/2017   HGBA1C 6.7 (H) 11/07/2016   Lab Results  Component Value Date   INSULIN 4.3 05/07/2018   INSULIN 7.9 01/24/2018   CBC    Component Value Date/Time   WBC 7.4 01/24/2018 1307   WBC 5.4 05/08/2017 1032   RBC 4.45 01/24/2018 1307   RBC 4.31 05/08/2017 1032   HGB 13.0 01/24/2018 1307   HCT 38.1 01/24/2018 1307   PLT 265 01/24/2018 1307   MCV 86 01/24/2018 1307   MCH 29.2 01/24/2018 1307   MCHC 34.1 01/24/2018 1307   MCHC 33.3 05/08/2017 1032   RDW 12.8 01/24/2018 1307   LYMPHSABS 1.2 01/24/2018 1307   MONOABS 0.5 05/08/2017 1032   EOSABS 0.4 01/24/2018 1307   BASOSABS 0.1 01/24/2018 1307   Iron/TIBC/Ferritin/ %Sat    Component Value Date/Time   IRON 102 05/08/2017 1032   IRON 102 05/08/2017 1032   FERRITIN 64.0 05/08/2017 1032   IRONPCTSAT 19.9 (L) 05/08/2017 1032   Lipid Panel     Component Value Date/Time   CHOL 148 05/07/2018 1321   TRIG 44 05/07/2018 1321   HDL 65 05/07/2018 1321   CHOLHDL 3.9 01/24/2018 1307   CHOLHDL 2 11/13/2017 0938   VLDL 11.4 11/13/2017 0938   LDLCALC 74 05/07/2018 1321   LDLDIRECT 198.6 03/20/2009 1127   Hepatic Function Panel     Component Value Date/Time   PROT 6.1 05/07/2018 1321   ALBUMIN 4.3 05/07/2018 1321   AST 14 05/07/2018 1321   ALT 12 05/07/2018 1321   ALKPHOS 37 (L) 05/07/2018 1321  BILITOT 0.4 05/07/2018 1321   BILIDIR 0.1 08/14/2014 0824      Component Value Date/Time   TSH 1.050 01/24/2018 1307   TSH 1.28 11/07/2016 1008   TSH 0.98 09/27/2011 0945       I, Burt KnackSharon Martin, am acting as transcriptionist for Debbra RidingAlexandria Kadolph, MD  I have reviewed the above documentation for accuracy and completeness, and I agree with the above. - Debbra RidingAlexandria Kadolph, MD

## 2018-08-30 ENCOUNTER — Telehealth (INDEPENDENT_AMBULATORY_CARE_PROVIDER_SITE_OTHER): Payer: Medicare Other | Admitting: Family Medicine

## 2018-09-01 ENCOUNTER — Other Ambulatory Visit: Payer: Self-pay | Admitting: Family Medicine

## 2018-09-01 DIAGNOSIS — E1151 Type 2 diabetes mellitus with diabetic peripheral angiopathy without gangrene: Secondary | ICD-10-CM

## 2018-09-01 DIAGNOSIS — I10 Essential (primary) hypertension: Secondary | ICD-10-CM

## 2018-09-01 DIAGNOSIS — IMO0002 Reserved for concepts with insufficient information to code with codable children: Secondary | ICD-10-CM

## 2018-09-13 ENCOUNTER — Telehealth (INDEPENDENT_AMBULATORY_CARE_PROVIDER_SITE_OTHER): Payer: Medicare Other | Admitting: Family Medicine

## 2018-09-13 ENCOUNTER — Other Ambulatory Visit: Payer: Self-pay

## 2018-09-13 ENCOUNTER — Encounter (INDEPENDENT_AMBULATORY_CARE_PROVIDER_SITE_OTHER): Payer: Self-pay | Admitting: Family Medicine

## 2018-09-13 DIAGNOSIS — I2510 Atherosclerotic heart disease of native coronary artery without angina pectoris: Secondary | ICD-10-CM

## 2018-09-13 DIAGNOSIS — Z6839 Body mass index (BMI) 39.0-39.9, adult: Secondary | ICD-10-CM | POA: Diagnosis not present

## 2018-09-13 DIAGNOSIS — E1165 Type 2 diabetes mellitus with hyperglycemia: Secondary | ICD-10-CM

## 2018-09-13 DIAGNOSIS — E7849 Other hyperlipidemia: Secondary | ICD-10-CM | POA: Diagnosis not present

## 2018-09-18 NOTE — Progress Notes (Signed)
Office: [phone]  /  Fax: [phone] TeleHealth Visit:  Zachary Michael has verbally consented to this TeleHealth visit today. The patient is located at home, the provider is located at the [facility] office. The participants in this visit include the listed provider, patient, wife Scarlette Calico(Frances) and any and all parties involved. The visit was conducted today via WebEx.  HPI:   Chief Complaint: OBESITY Zachary Michael is here to discuss his progress with his obesity treatment plan. He is on the Category 3 plan and is following his eating plan approximately 75 to 80 % of the time. He states he is doing yard work 90 minutes 7 times per week and walking the dog 30 minutes 7 times per week. Zachary Michael has been doing quite a bit of yard work. He had a weight of 245.4 this morning. He has been sticking to the plan fairly consistently, and he has had no episodes of difficulty concentrating or hypoglycemia. We were unable to weigh the patient today for this TeleHealth visit. He feels as if he has lost weight since his last visit. He has lost 56 lbs since starting treatment with us.  Diabetes II with hyperglycemia Zachary Michael has a diagnosis of diabetes type II. Zachary Michael is on metformin only and his lowest bloiod sugar has been 115 in the past month and his highest blood sugar was 151. His average blood sugar was 133 over the past month. Zachary Michael is on statin and ACE. He denies any hypoglycemic episodes. He has been working on intensive lifestyle modifications including diet, exercise, and weight loss to help control his blood glucose levels.  Hyperlipidemia Zachary Michael has hyperlipidemia. Zachary Michael has LDL of 74 and HDL of 65. He is on statin. Zachary Michael has been trying to improve his cholesterol levels with intensive lifestyle modification including a low saturated fat diet, exercise and weight loss. He denies any chest pain, claudication or myalgias.  ASSESSMENT AND PLAN:  Type 2 diabetes mellitus with  hyperglycemia, without long-term current use of insulin (HCC)  Other hyperlipidemia  Class 2 severe obesity with serious comorbidity and body mass index (BMI) of 39.0 to 39.9 in adult, unspecified obesity type (HCC)  PLAN:  Diabetes II with hyperglycemia Zachary Michael has been given extensive diabetes education by myself today including ideal fasting and post-prandial blood glucose readings, individual ideal Hgb A1c goals and hypoglycemia prevention. We discussed the importance of good blood sugar control to decrease the likelihood of diabetic complications such as nephropathy, neuropathy, limb loss, blindness, coronary artery disease, and death. We discussed the importance of intensive lifestyle modification including diet, exercise and weight loss as the first line treatment for diabetes. Zachary Michael agrees to continue metformin and follow up at the agreed upon time.  Hyperlipidemia Zachary Michael was informed of the American Heart Association Guidelines emphasizing intensive lifestyle modifications as the first line treatment for hyperlipidemia. We discussed many lifestyle modifications today in depth, and Zachary Michael will continue to work on decreasing saturated fats such as fatty red meat, butter and many fried foods. He will also increase vegetables and lean protein in his diet and continue to work on exercise and weight loss efforts. We will repeat labs at the next appointment.  Obesity Zachary Michael is currently in the action stage of change. As such, his goal is to continue with weight loss efforts He has agreed to keep a food journal with 1400 to 1600 calories and 100+ grams of protein daily and follow the Category 3 plan Zachary Michael has been instructed to work up  to a goal of 150 minutes of combined cardio and strengthening exercise per week for weight loss and overall health benefits. We discussed the following Behavioral Modification Strategies today: planning for success, keeping healthy foods in the home,  increasing lean protein intake, increasing vegetables and work on meal planning and easy cooking plans  Zachary Michael has agreed to follow up with our clinic in 2 to 3 weeks. He was informed of the importance of frequent follow up visits to maximize his success with intensive lifestyle modifications for his multiple health conditions.  ALLERGIES: No Known Allergies  MEDICATIONS: Current Outpatient Medications on File Prior to Visit  Medication Sig Dispense Refill  . albuterol (PROAIR HFA) 108 (90 BASE) MCG/ACT inhaler Inhale 2 puffs into the lungs every 6 (six) hours as needed for wheezing or shortness of breath. 1 Inhaler 4  . aspirin 81 MG tablet Take 81 mg by mouth daily.      [name]. b complex vitamins tablet Take 1 tablet by mouth daily.    . calcium carbonate (CALCIUM 600) 600 MG TABS tablet Take 600 mg by mouth daily with breakfast.    . carvedilol (COREG) 6.25 MG tablet TAKE 1 TABLET (6.25 MG TOTAL) BY MOUTH 2 (TWO) TIMES DAILY WITH A MEAL. 180 tablet 1  . Cinnamon 500 MG capsule Take 500 mg by mouth daily.    [name]. co-enzyme Q-10 30 MG capsule Take 30 mg by mouth 3 (three) times daily.    . fenofibrate 160 MG tablet TAKE 1 TABLET BY MOUTH EVERY DAY 90 tablet 1  . ferrous sulfate 325 (65 FE) MG EC tablet Take 325 mg by mouth 3 (three) times daily with meals.    [name]. glucose blood (ONETOUCH VERIO) test strip Use as instructed 100 each 0  . lisinopril (ZESTRIL) 10 MG tablet TAKE 1 TABLET BY MOUTH EVERY DAY 90 tablet 1  . metFORMIN (GLUCOPHAGE) 1000 MG tablet TAKE 1 TABLET (1,000 MG TOTAL) BY MOUTH 2 (TWO) TIMES DAILY WITH A MEAL. 180 tablet 1  . Multiple Vitamin (MULTIVITAMIN) capsule Take 1 capsule by mouth daily.    . nitroGLYCERIN (NITROSTAT) 0.4 MG SL tablet Place 1 tablet (0.4 mg total) under the tongue every 5 (five) minutes as needed for chest pain. 25 tablet 5  . simvastatin (ZOCOR) 40 MG tablet TAKE 1 TABLET BY MOUTH EVERYDAY AT BEDTIME 90 tablet 1  . Zinc 100 MG TABS Take 1 tablet by mouth  daily.    . fluticasone (FLONASE) 50 MCG/ACT nasal spray Place 2 sprays into both nostrils daily. (Patient not taking: Reported on 09/13/2018) 16 g 6  . loratadine (CLARITIN) 10 MG tablet Take 1 tablet (10 mg total) by mouth daily. (Patient not taking: Reported on 09/13/2018) 30 tablet 11   No current facility-administered medications on file prior to visit.     PAST MEDICAL HISTORY: Past Medical History:  Diagnosis Date  . Asthma   . Back pain   . CAD (coronary artery disease)    Mild  . Diabetes mellitus   . Heart valve problem   . HTN (hypertension)   . Hyperlipidemia   . Joint pain   . OSA (obstructive sleep apnea) 08/09/2017  . Palpitations   . SOB (shortness of breath)   . Subluxation    L2-3-4    PAST SURGICAL HISTORY: Past Surgical History:  Procedure Laterality Date  . APPENDECTOMY    . CARDIAC CATHETERIZATION     We recently performed a which revealed minor coronary artery irregularities.  He had normal left ventricle systolic function with an EF of 60%  . CARDIOVASCULAR STRESS TEST     EF 63%  . TONSILLECTOMY    . TUMOR REMOVAL     LEFT ARM    SOCIAL HISTORY: Social History   Tobacco Use  . Smoking status: Former Smoker    Years: 7.00    Types: Pipe    Quit date: 1980    Years since quitting: 40.5  . Smokeless tobacco: Never Used  Substance Use Topics  . Alcohol use: Yes    Comment: occassional  . Drug use: No    FAMILY HISTORY: Family History  Problem Relation Age of Onset  . Pneumonia Mother   . Osteoporosis Mother   . Arthritis Mother   . Diabetes Mother   . Hypertension Mother   . Cancer Father        leukemia, lung  . Cancer Brother   . Diabetes Maternal Aunt   . Hypertension Maternal Aunt   . Hypertension Maternal Aunt   . Lung cancer Unknown     ROS: Review of Systems  Constitutional: Positive for weight loss.  Endo/Heme/Allergies:       Negative for hypoglycemia    PHYSICAL EXAM: Pt in no acute distress  RECENT LABS AND  TESTS: BMET    Component Value Date/Time   NA 142 05/07/2018 1321   K 4.4 05/07/2018 1321   CL 104 05/07/2018 1321   CO2 22 05/07/2018 1321   GLUCOSE 129 (H) 05/07/2018 1321   GLUCOSE 117 (H) 11/13/2017 0938   BUN 19 05/07/2018 1321   CREATININE 0.78 05/07/2018 1321   CALCIUM 10.1 05/07/2018 1321   GFRNONAA 93 05/07/2018 1321   GFRAA 107 05/07/2018 1321   Lab Results  Component Value Date   HGBA1C 6.4 (H) 05/07/2018   HGBA1C 6.9 (H) 11/13/2017   HGBA1C 6.9 (H) 08/01/2017   HGBA1C 7.5 (H) 05/08/2017   HGBA1C 6.7 (H) 11/07/2016   Lab Results  Component Value Date   INSULIN 4.3 05/07/2018   INSULIN 7.9 01/24/2018   CBC    Component Value Date/Time   WBC 7.4 01/24/2018 1307   WBC 5.4 05/08/2017 1032   RBC 4.45 01/24/2018 1307   RBC 4.31 05/08/2017 1032   HGB 13.0 01/24/2018 1307   HCT 38.1 01/24/2018 1307   PLT 265 01/24/2018 1307   MCV 86 01/24/2018 1307   MCH 29.2 01/24/2018 1307   MCHC 34.1 01/24/2018 1307   MCHC 33.3 05/08/2017 1032   RDW 12.8 01/24/2018 1307   LYMPHSABS 1.2 01/24/2018 1307   MONOABS 0.5 05/08/2017 1032   EOSABS 0.4 01/24/2018 1307   BASOSABS 0.1 01/24/2018 1307   Iron/TIBC/Ferritin/ %Sat    Component Value Date/Time   IRON 102 05/08/2017 1032   IRON 102 05/08/2017 1032   FERRITIN 64.0 05/08/2017 1032   IRONPCTSAT 19.9 (L) 05/08/2017 1032   Lipid Panel     Component Value Date/Time   CHOL 148 05/07/2018 1321   TRIG 44 05/07/2018 1321   HDL 65 05/07/2018 1321   CHOLHDL 3.9 01/24/2018 1307   CHOLHDL 2 11/13/2017 0938   VLDL 11.4 11/13/2017 0938   LDLCALC 74 05/07/2018 1321   LDLDIRECT 198.6 03/20/2009 1127   Hepatic Function Panel     Component Value Date/Time   PROT 6.1 05/07/2018 1321   ALBUMIN 4.3 05/07/2018 1321   AST 14 05/07/2018 1321   ALT 12 05/07/2018 1321   ALKPHOS 37 (L) 05/07/2018 1321   BILITOT 0.4  05/07/2018 1321   BILIDIR 0.1 08/14/2014 0824      Component Value Date/Time   TSH 1.050 01/24/2018 1307   TSH  1.28 11/07/2016 1008   TSH 0.98 09/27/2011 0945     Ref. Range 05/07/2018 13:21  Vitamin D, 25-Hydroxy Latest Ref Range: 30.0 - 100.0 ng/mL 53.9    I, [place] CraneJoanne Murray, am acting as Energy managertranscriptionist for Filbert SchilderAlexandria U. Kadolph, MD   I have reviewed the above documentation for accuracy and completeness, and I agree with the above. - Debbra RidingAlexandria Kadolph, MD

## 2018-10-04 ENCOUNTER — Encounter (INDEPENDENT_AMBULATORY_CARE_PROVIDER_SITE_OTHER): Payer: Self-pay | Admitting: Family Medicine

## 2018-10-04 ENCOUNTER — Ambulatory Visit (INDEPENDENT_AMBULATORY_CARE_PROVIDER_SITE_OTHER): Payer: Medicare Other | Admitting: Family Medicine

## 2018-10-04 ENCOUNTER — Other Ambulatory Visit: Payer: Self-pay

## 2018-10-04 VITALS — BP 97/57 | HR 54 | Temp 97.9°F | Ht 69.0 in | Wt 245.0 lb

## 2018-10-04 DIAGNOSIS — I10 Essential (primary) hypertension: Secondary | ICD-10-CM

## 2018-10-04 DIAGNOSIS — I2510 Atherosclerotic heart disease of native coronary artery without angina pectoris: Secondary | ICD-10-CM

## 2018-10-04 DIAGNOSIS — Z6836 Body mass index (BMI) 36.0-36.9, adult: Secondary | ICD-10-CM | POA: Diagnosis not present

## 2018-10-04 DIAGNOSIS — E1165 Type 2 diabetes mellitus with hyperglycemia: Secondary | ICD-10-CM | POA: Diagnosis not present

## 2018-10-04 NOTE — Progress Notes (Signed)
Office: [phone]  /  Fax: [phone]   HPI:   Chief Complaint: OBESITY Zachary Michael is here to discuss his progress with his obesity treatment plan. He is on the Category 3 plan and is following his eating plan approximately 90% of the time. He states he is walking/yard work 30-90 minutes 7 times per week. Zachary Michael has a total weight loss of 56 pounds and is recording everything on My Fitness Pal. Calorie-wise he is averaging closer to 1600 and approximately 90 grams of protein average. He does report occasional hunger. His weight is 245 lb (111.1 kg) today and has had a weight loss of 25 pounds over a period of 5 months since his last in-office visit. He has lost 56 lbs since starting treatment with us.  Diabetes II with Hyperglycemia, not on Insulin Zachary Michael has a diagnosis of diabetes type II. Zachary Michael does not report checking his blood sugars. He had one episode of hypoglycemia secondary to low intake and calories of only approximately 800 a day. This resolved with eating and a glucose tablet. Last A1c was 6.4 on 05/07/2018. He has been working on intensive lifestyle modifications including diet, exercise, and weight loss to help control his blood glucose levels.  Hypertension Zachary Michael is a 68 y.o. male with hypertension.  Zachary Michael denies chest pain, chest pressure, or headache. He is working weight loss to help control his blood pressure with the goal of decreasing his risk of heart attack and stroke. Zachary Michael's blood pressure is controlled but somewhat low at 97/57.  ASSESSMENT AND PLAN:  Type 2 diabetes mellitus with hyperglycemia, without long-term current use of insulin (HCC) - Plan: Comprehensive metabolic panel, Hemoglobin A1c, Insulin, random, Lipid Panel With LDL/HDL Ratio  Essential hypertension  Class 2 severe obesity with serious comorbidity and body mass index (BMI) of 36.0 to 36.9 in adult, unspecified obesity type (HCC)  PLAN:  Diabetes II with Hyperglycemia,  not on Insulin Zachary Michael has been given extensive diabetes education by myself today including ideal fasting and post-prandial blood glucose readings, individual ideal HgA1c goals  and hypoglycemia prevention. We discussed the importance of good blood sugar control to decrease the likelihood of diabetic complications such as nephropathy, neuropathy, limb loss, blindness, coronary artery disease, and death. We discussed the importance of intensive lifestyle modification including diet, exercise and weight loss as the first line treatment for diabetes. Zachary Michael will have HgA1c, insulin, and FLP drawn today and will follow-up at the agreed upon time.  Hypertension We discussed sodium restriction, working on healthy weight loss, and a regular exercise program as the means to achieve improved blood pressure control. Zachary Michael agreed with this plan and agreed to follow up as directed. We will continue to monitor his blood pressure as well as his progress with the above lifestyle modifications. Zachary Michael will have a CMP drawn today. He will decrease his dose of lisinopril to 5 mg (1/2 tablet of current 10 mg) and will watch for signs of hypotension as he continues his lifestyle modifications.  Obesity Zachary Michael is currently in the action stage of change. As such, his goal is to continue with weight loss efforts. He has agreed to follow the Category 3 plan and journal 1450-1600 calories and 90+ grams of protein daily. Zachary Michael has been instructed to work up to a goal of 150 minutes of combined cardio and strengthening exercise per week for weight loss and overall health benefits. We discussed the following Behavioral Modification Strategies today: increasing lean protein intake, increasing vegetables,  work on meal planning and easy cooking plans, keeping healthy foods in the home, and planning for success.  Zachary Michael has agreed to follow-up with our clinic in 3 weeks. He was informed of the importance of frequent follow-up  visits to maximize his success with intensive lifestyle modifications for his multiple health conditions.  ALLERGIES: No Known Allergies  MEDICATIONS: Current Outpatient Medications on File Prior to Visit  Medication Sig Dispense Refill  . albuterol (PROAIR HFA) 108 (90 BASE) MCG/ACT inhaler Inhale 2 puffs into the lungs every 6 (six) hours as needed for wheezing or shortness of breath. 1 Inhaler 4  . aspirin 81 MG tablet Take 81 mg by mouth daily.      [name]. b complex vitamins tablet Take 1 tablet by mouth daily.    . calcium carbonate (CALCIUM 600) 600 MG TABS tablet Take 600 mg by mouth daily with breakfast.    . carvedilol (COREG) 6.25 MG tablet TAKE 1 TABLET (6.25 MG TOTAL) BY MOUTH 2 (TWO) TIMES DAILY WITH A MEAL. 180 tablet 1  . Cinnamon 500 MG capsule Take 500 mg by mouth daily.    [name]. co-enzyme Q-10 30 MG capsule Take 30 mg by mouth 3 (three) times daily.    . fenofibrate 160 MG tablet TAKE 1 TABLET BY MOUTH EVERY DAY 90 tablet 1  . ferrous sulfate 325 (65 FE) MG EC tablet Take 325 mg by mouth 3 (three) times daily with meals.    . fluticasone (FLONASE) 50 MCG/ACT nasal spray Place 2 sprays into both nostrils daily. 16 g 6  . glucose blood (ONETOUCH VERIO) test strip Use as instructed 100 each 0  . lisinopril (ZESTRIL) 10 MG tablet TAKE 1 TABLET BY MOUTH EVERY DAY 90 tablet 1  . loratadine (CLARITIN) 10 MG tablet Take 1 tablet (10 mg total) by mouth daily. 30 tablet 11  . metFORMIN (GLUCOPHAGE) 1000 MG tablet TAKE 1 TABLET (1,000 MG TOTAL) BY MOUTH 2 (TWO) TIMES DAILY WITH A MEAL. 180 tablet 1  . Multiple Vitamin (MULTIVITAMIN) capsule Take 1 capsule by mouth daily.    . nitroGLYCERIN (NITROSTAT) 0.4 MG SL tablet Place 1 tablet (0.4 mg total) under the tongue every 5 (five) minutes as needed for chest pain. 25 tablet 5  . simvastatin (ZOCOR) 40 MG tablet TAKE 1 TABLET BY MOUTH EVERYDAY AT BEDTIME 90 tablet 1  . Zinc 100 MG TABS Take 1 tablet by mouth daily.     No current  facility-administered medications on file prior to visit.     PAST MEDICAL HISTORY: Past Medical History:  Diagnosis Date  . Asthma   . Back pain   . CAD (coronary artery disease)    Mild  . Diabetes mellitus   . Heart valve problem   . HTN (hypertension)   . Hyperlipidemia   . Joint pain   . OSA (obstructive sleep apnea) 08/09/2017  . Palpitations   . SOB (shortness of breath)   . Subluxation    L2-3-4    PAST SURGICAL HISTORY: Past Surgical History:  Procedure Laterality Date  . APPENDECTOMY    . CARDIAC CATHETERIZATION     We recently performed a which revealed minor coronary artery irregularities. He had normal left ventricle systolic function with an EF of 60%  . CARDIOVASCULAR STRESS TEST     EF 63%  . TONSILLECTOMY    . TUMOR REMOVAL     LEFT ARM    SOCIAL HISTORY: Social History   Tobacco Use  .  Smoking status: Former Smoker    Years: 7.00    Types: Pipe    Quit date: 1980    Years since quitting: 40.6  . Smokeless tobacco: Never Used  Substance Use Topics  . Alcohol use: Yes    Comment: occassional  . Drug use: No    FAMILY HISTORY: Family History  Problem Relation Age of Onset  . Pneumonia Mother   . Osteoporosis Mother   . Arthritis Mother   . Diabetes Mother   . Hypertension Mother   . Cancer Father        leukemia, lung  . Cancer Brother   . Diabetes Maternal Aunt   . Hypertension Maternal Aunt   . Hypertension Maternal Aunt   . Lung cancer Unknown    ROS: Review of Systems  Cardiovascular: Negative for chest pain.       Negative for chest pressure.  Neurological: Negative for headaches.   PHYSICAL EXAM: Blood pressure (!) 97/57, pulse (!) 54, temperature 97.9 F (36.6 C), temperature source Oral, height 5\' 9"  (1.753 m), weight 245 lb (111.1 kg), SpO2 97 %. Body mass index is 36.18 kg/m. Physical Exam Vitals signs reviewed.  Constitutional:      Appearance: Normal appearance. He is obese.  Cardiovascular:     Rate and  Rhythm: Normal rate.     Pulses: Normal pulses.  Pulmonary:     Effort: Pulmonary effort is normal.     Breath sounds: Normal breath sounds.  Musculoskeletal: Normal range of motion.  Skin:    General: Skin is warm and dry.  Neurological:     Mental Status: He is alert and oriented to person, place, and time.  Psychiatric:        Behavior: Behavior normal.   RECENT LABS AND TESTS: BMET    Component Value Date/Time   NA 142 05/07/2018 1321   K 4.4 05/07/2018 1321   CL 104 05/07/2018 1321   CO2 22 05/07/2018 1321   GLUCOSE 129 (H) 05/07/2018 1321   GLUCOSE 117 (H) 11/13/2017 0938   BUN 19 05/07/2018 1321   CREATININE 0.78 05/07/2018 1321   CALCIUM 10.1 05/07/2018 1321   GFRNONAA 93 05/07/2018 1321   GFRAA 107 05/07/2018 1321   Lab Results  Component Value Date   HGBA1C 6.4 (H) 05/07/2018   HGBA1C 6.9 (H) 11/13/2017   HGBA1C 6.9 (H) 08/01/2017   HGBA1C 7.5 (H) 05/08/2017   HGBA1C 6.7 (H) 11/07/2016   Lab Results  Component Value Date   INSULIN 4.3 05/07/2018   INSULIN 7.9 01/24/2018   CBC    Component Value Date/Time   WBC 7.4 01/24/2018 1307   WBC 5.4 05/08/2017 1032   RBC 4.45 01/24/2018 1307   RBC 4.31 05/08/2017 1032   HGB 13.0 01/24/2018 1307   HCT 38.1 01/24/2018 1307   PLT 265 01/24/2018 1307   MCV 86 01/24/2018 1307   MCH 29.2 01/24/2018 1307   MCHC 34.1 01/24/2018 1307   MCHC 33.3 05/08/2017 1032   RDW 12.8 01/24/2018 1307   LYMPHSABS 1.2 01/24/2018 1307   MONOABS 0.5 05/08/2017 1032   EOSABS 0.4 01/24/2018 1307   BASOSABS 0.1 01/24/2018 1307   Iron/TIBC/Ferritin/ %Sat    Component Value Date/Time   IRON 102 05/08/2017 1032   IRON 102 05/08/2017 1032   FERRITIN 64.0 05/08/2017 1032   IRONPCTSAT 19.9 (L) 05/08/2017 1032   Lipid Panel     Component Value Date/Time   CHOL 148 05/07/2018 1321   TRIG 44 05/07/2018  1321   HDL 65 05/07/2018 1321   CHOLHDL 3.9 01/24/2018 1307   CHOLHDL 2 11/13/2017 0938   VLDL 11.4 11/13/2017 0938   LDLCALC  74 05/07/2018 1321   LDLDIRECT 198.6 03/20/2009 1127   Hepatic Function Panel     Component Value Date/Time   PROT 6.1 05/07/2018 1321   ALBUMIN 4.3 05/07/2018 1321   AST 14 05/07/2018 1321   ALT 12 05/07/2018 1321   ALKPHOS 37 (L) 05/07/2018 1321   BILITOT 0.4 05/07/2018 1321   BILIDIR 0.1 08/14/2014 0824      Component Value Date/Time   TSH 1.050 01/24/2018 1307   TSH 1.28 11/07/2016 1008   TSH 0.98 09/27/2011 0945   Results for VUONG, MUSA (MRN [id number]) as of 10/04/2018 11:35  Ref. Range 05/07/2018 13:21  Vitamin D, 25-Hydroxy Latest Ref Range: 30.0 - 100.0 ng/mL 53.9   OBESITY BEHAVIORAL INTERVENTION VISIT  Today's visit was #16  Starting weight: 301 lbs Starting date: 01/24/2018 Today's weight: 245 lbs  Today's date: 10/04/2018 Total lbs lost to date: 50 At least 15 minutes were spent on discussing the following behavioral intervention visit.    10/04/2018  Height 5\' 9"  (1.753 m)  Weight 245 lb (111.1 kg)  BMI (Calculated) 36.16  BLOOD PRESSURE - SYSTOLIC 97  BLOOD PRESSURE - DIASTOLIC 57   Body Fat % 34.1 %  Total Body Water (lbs) 122.6 lbs   ASK: We discussed the diagnosis of obesity with Zachary Michael today and Zachary Michael agreed to give [name] permission to discuss obesity behavioral modification therapy today.  ASSESS: Zachary Michael has the diagnosis of obesity and his BMI today is 36.3. Zachary Michael is in the action stage of change.   ADVISE: Zachary Michael was educated on the multiple health risks of obesity as well as the benefit of weight loss to improve his health. He was advised of the need for long term treatment and the importance of lifestyle modifications to improve his current health and to decrease his risk of future health problems.  AGREE: Multiple dietary modification options and treatment options were discussed and  Kamani agreed to follow the recommendations documented in the above note.  ARRANGE: Moyses was educated on the importance of frequent visits to  treat obesity as outlined per CMS and USPSTF guidelines and agreed to schedule his next follow up appointment today.  I, Michaelene Song, am acting as transcriptionist for Ilene Qua, MD   I have reviewed the above documentation for accuracy and completeness, and I agree with the above. - Ilene Qua, MD

## 2018-10-06 LAB — LIPID PANEL WITH LDL/HDL RATIO
Cholesterol, Total: 221 mg/dL — ABNORMAL HIGH (ref 100–199)
HDL: 60 mg/dL (ref >39)
LDL Calculated: 143 mg/dL — ABNORMAL HIGH (ref 0–99)
LDl/HDL Ratio: 2.4 ratio (ref 0.0–3.6)
Triglycerides: 92 mg/dL (ref 0–149)
VLDL Cholesterol Cal: 18 mg/dL (ref 5–40)

## 2018-10-06 LAB — COMPREHENSIVE METABOLIC PANEL
ALT: 10 IU/L (ref 0–44)
AST: 14 IU/L (ref 0–40)
Albumin/Globulin Ratio: 2.3 — ABNORMAL HIGH (ref 1.2–2.2)
Albumin: 4.6 g/dL (ref 3.8–4.8)
Alkaline Phosphatase: 38 IU/L — ABNORMAL LOW (ref 39–117)
BUN/Creatinine Ratio: 22 (ref 10–24)
BUN: 18 mg/dL (ref 8–27)
Bilirubin Total: 0.4 mg/dL (ref 0.0–1.2)
CO2: 20 mmol/L (ref 20–29)
Calcium: 9.7 mg/dL (ref 8.6–10.2)
Chloride: 102 mmol/L (ref 96–106)
Creatinine, Ser: 0.82 mg/dL (ref 0.76–1.27)
GFR calc Af Amer: 105 mL/min/{1.73_m2} (ref >59)
GFR calc non Af Amer: 91 mL/min/{1.73_m2} (ref >59)
Globulin, Total: 2 g/dL (ref 1.5–4.5)
Glucose: 129 mg/dL — ABNORMAL HIGH (ref 65–99)
Potassium: 4.2 mmol/L (ref 3.5–5.2)
Sodium: 139 mmol/L (ref 134–144)
Total Protein: 6.6 g/dL (ref 6.0–8.5)

## 2018-10-06 LAB — INSULIN, RANDOM: INSULIN: 2.5 u[IU]/mL — ABNORMAL LOW (ref 2.6–24.9)

## 2018-10-06 LAB — HEMOGLOBIN A1C
Est. average glucose Bld gHb Est-mCnc: 140 mg/dL
Hgb A1c MFr Bld: 6.5 % — ABNORMAL HIGH (ref 4.8–5.6)

## 2018-10-23 ENCOUNTER — Other Ambulatory Visit: Payer: Self-pay

## 2018-10-23 ENCOUNTER — Ambulatory Visit (INDEPENDENT_AMBULATORY_CARE_PROVIDER_SITE_OTHER): Payer: Medicare Other | Admitting: Family Medicine

## 2018-10-23 VITALS — BP 114/67 | HR 73 | Temp 98.3°F | Ht 69.0 in | Wt 246.0 lb

## 2018-10-23 DIAGNOSIS — E1165 Type 2 diabetes mellitus with hyperglycemia: Secondary | ICD-10-CM

## 2018-10-23 DIAGNOSIS — Z6836 Body mass index (BMI) 36.0-36.9, adult: Secondary | ICD-10-CM | POA: Diagnosis not present

## 2018-10-23 DIAGNOSIS — I2510 Atherosclerotic heart disease of native coronary artery without angina pectoris: Secondary | ICD-10-CM

## 2018-10-23 DIAGNOSIS — I10 Essential (primary) hypertension: Secondary | ICD-10-CM

## 2018-10-23 DIAGNOSIS — E66812 Obesity, class 2: Secondary | ICD-10-CM

## 2018-10-23 MED ORDER — CARVEDILOL 6.25 MG PO TABS: 6.2500 mg | ORAL_TABLET | Freq: Two times a day (BID) | ORAL | 0 refills | Status: DC

## 2018-10-23 MED ORDER — SIMVASTATIN 40 MG PO TABS: ORAL_TABLET | ORAL | 0 refills | Status: DC

## 2018-10-23 MED ORDER — FENOFIBRATE 160 MG PO TABS: 160.0000 mg | ORAL_TABLET | Freq: Every day | ORAL | 0 refills | Status: DC

## 2018-10-25 NOTE — Progress Notes (Signed)
Office: [phone]  /  Fax: [phone]   HPI:   Chief Complaint: OBESITY Zachary Michael is here to discuss his progress with his obesity treatment plan. He is on the keep a food journal with 1450 to 1600 calories and 90 grams of protein daily and the Category 3 plan and is following his eating plan approximately 90 % of the time. He states he is walking or doing yard work 60 minutes 7 times per week. Zachary Michael had a slight decrease in exercise, secondary to raining the past two weeks. He is doing well with journaling, sticking to calories and getting his protein. He is often exceeding his protein level by 20 to 30 grams. His weight is 246 lb (111.6 kg) today and has had a weight gain of 1 pound over a period of 2 to 3 weeks since his last visit. He has lost 55 lbs since starting treatment with us.  CAD (coronary artery disease) Zachary Michael has a diagnosis of coronary artery disease and he is stable on medications. He denies myalgias. He had a recent increase in LDL, but patient was not consistently taking statin or fenofibrate.  Hypertension Zachary Michael [name] is a 68 y.o. male with hypertension. Zachary Michael Findling denies chest pain, chest pressure or headache. He is now taking a half pill of lisinopril. He is working weight loss to help control his blood pressure with the goal of decreasing his risk of heart attack and stroke. [name] blood pressure is controlled.  Diabetes II with hyperglycemia, non-insulin Zachary Michael has a diagnosis of diabetes type II. Zachary Michael states fasting blood sugar high of 150 today (normally 120's). He denies any hypoglycemic episodes. Zachary Michael stopped pioglitazone. He had a recent A1c of 6.5 and insulin level of 2.5. He is doing 1 to 2 slices of toast in the evening recently. He has been working on intensive lifestyle modifications including diet, exercise, and weight loss to help control his blood glucose levels.  ASSESSMENT AND PLAN:  Essential hypertension - Plan: carvedilol  (COREG) 6.25 MG tablet  Type 2 diabetes mellitus with hyperglycemia, without long-term current use of insulin (HCC)  Coronary artery disease involving native heart without angina pectoris, unspecified vessel or lesion type - Plan: simvastatin (ZOCOR) 40 MG tablet  Class 2 severe obesity with serious comorbidity and body mass index (BMI) of 36.0 to 36.9 in adult, unspecified obesity type (HCC)  PLAN:  CAD (coronary artery disease) Zachary Michael agrees to continue simvastatin 40 mg daily #30 with no refills. He agreed to continue fenofibrate 160 mg daily #30 with no refills and carvedilol 6.25 mg two times daily #60 with no refills and follow up as directed.  Hypertension We discussed sodium restriction, working on healthy weight loss, and a regular exercise program as the means to achieve improved blood pressure control. Zachary Michael agreed with this plan and agreed to follow up as directed. We will continue to monitor his blood pressure as well as his progress with the above lifestyle modifications. He will continue his medications as prescribed and will watch for signs of hypotension as he continues his lifestyle modifications.  Diabetes II with hyperglycemia, non-insulin Zachary Michael has been given extensive diabetes education by myself today including ideal fasting and post-prandial blood glucose readings, individual ideal Hgb A1c goals and hypoglycemia prevention. We discussed the importance of good blood sugar control to decrease the likelihood of diabetic complications such as nephropathy, neuropathy, limb loss, blindness, coronary artery disease, and death. We discussed the importance of intensive lifestyle modification including diet, exercise  and weight loss as the first line treatment for diabetes. Zachary Michael will continue metformin at the current dose and he will follow up at the agreed upon time.  Obesity Zachary Michael is currently in the action stage of change. As such, his goal is to continue with weight  loss efforts He has agreed to keep a food journal with 1450 to 1600 calories and 90+ grams of protein daily and follow the Category 3 plan Kiel has been instructed to work up to a goal of 150 minutes of combined cardio and strengthening exercise per week for weight loss and overall health benefits. We discussed the following Behavioral Modification Strategies today: planning for success, keeping healthy foods in the home, increasing lean protein intake, increasing vegetables and work on meal planning and easy cooking plans  Zachary Michael has agreed to follow up with our clinic in 2 weeks. He was informed of the importance of frequent follow up visits to maximize his success with intensive lifestyle modifications for his multiple health conditions.  ALLERGIES: No Known Allergies  MEDICATIONS: Current Outpatient Medications on File Prior to Visit  Medication Sig Dispense Refill  . albuterol (PROAIR HFA) 108 (90 BASE) MCG/ACT inhaler Inhale 2 puffs into the lungs every 6 (six) hours as needed for wheezing or shortness of breath. 1 Inhaler 4  . aspirin 81 MG tablet Take 81 mg by mouth daily.      Zachary Michael b complex vitamins tablet Take 1 tablet by mouth daily.    . calcium carbonate (CALCIUM 600) 600 MG TABS tablet Take 600 mg by mouth daily with breakfast.    . Cinnamon 500 MG capsule Take 500 mg by mouth daily.    Zachary Michael co-enzyme Q-10 30 MG capsule Take 30 mg by mouth 3 (three) times daily.    . ferrous sulfate 325 (65 FE) MG EC tablet Take 325 mg by mouth 3 (three) times daily with meals.    . fluticasone (FLONASE) 50 MCG/ACT nasal spray Place 2 sprays into both nostrils daily. 16 g 6  . glucose blood (ONETOUCH VERIO) test strip Use as instructed 100 each 0  . lisinopril (ZESTRIL) 10 MG tablet TAKE 1 TABLET BY MOUTH EVERY DAY 90 tablet 1  . loratadine (CLARITIN) 10 MG tablet Take 1 tablet (10 mg total) by mouth daily. 30 tablet 11  . metFORMIN (GLUCOPHAGE) 1000 MG tablet TAKE 1 TABLET (1,000 MG TOTAL) BY  MOUTH 2 (TWO) TIMES DAILY WITH A MEAL. 180 tablet 1  . Multiple Vitamin (MULTIVITAMIN) capsule Take 1 capsule by mouth daily.    . nitroGLYCERIN (NITROSTAT) 0.4 MG SL tablet Place 1 tablet (0.4 mg total) under the tongue every 5 (five) minutes as needed for chest pain. 25 tablet 5  . Zinc 100 MG TABS Take 1 tablet by mouth daily.     No current facility-administered medications on file prior to visit.     PAST MEDICAL HISTORY: Past Medical History:  Diagnosis Date  . Asthma   . Back pain   . CAD (coronary artery disease)    Mild  . Diabetes mellitus   . Heart valve problem   . HTN (hypertension)   . Hyperlipidemia   . Joint pain   . OSA (obstructive sleep apnea) 08/09/2017  . Palpitations   . SOB (shortness of breath)   . Subluxation    L2-3-4    PAST SURGICAL HISTORY: Past Surgical History:  Procedure Laterality Date  . APPENDECTOMY    . CARDIAC CATHETERIZATION     We  recently performed a which revealed minor coronary artery irregularities. He had normal left ventricle systolic function with an EF of 60%  . CARDIOVASCULAR STRESS TEST     EF 63%  . TONSILLECTOMY    . TUMOR REMOVAL     LEFT ARM    SOCIAL HISTORY: Social History   Tobacco Use  . Smoking status: Former Smoker    Years: 7.00    Types: Pipe    Quit date: 1980    Years since quitting: 40.6  . Smokeless tobacco: Never Used  Substance Use Topics  . Alcohol use: Yes    Comment: occassional  . Drug use: No    FAMILY HISTORY: Family History  Problem Relation Age of Onset  . Pneumonia Mother   . Osteoporosis Mother   . Arthritis Mother   . Diabetes Mother   . Hypertension Mother   . Cancer Father        leukemia, lung  . Cancer Brother   . Diabetes Maternal Aunt   . Hypertension Maternal Aunt   . Hypertension Maternal Aunt   . Lung cancer Unknown     ROS: Review of Systems  Constitutional: Negative for weight loss.  Cardiovascular: Negative for chest pain.       Negative for chest  pressure  Musculoskeletal: Negative for myalgias.  Neurological: Negative for headaches.  Endo/Heme/Allergies:       Negative for hypoglycemia Positive for hyperglycemia    PHYSICAL EXAM: Blood pressure 114/67, pulse 73, temperature 98.3 F (36.8 C), temperature source Oral, height 5\' 9"  (1.753 m), weight 246 lb (111.6 kg), SpO2 97 %. Body mass index is 36.33 kg/m. Physical Exam Vitals signs reviewed.  Constitutional:      Appearance: Normal appearance. He is well-developed. He is obese.  Cardiovascular:     Rate and Rhythm: Normal rate.  Pulmonary:     Effort: Pulmonary effort is normal.  Musculoskeletal: Normal range of motion.  Skin:    General: Skin is warm and dry.  Neurological:     Mental Status: He is alert and oriented to person, place, and time.  Psychiatric:        Mood and Affect: Mood normal.        Behavior: Behavior normal.     RECENT LABS AND TESTS: BMET    Component Value Date/Time   NA 139 10/04/2018 0910   K 4.2 10/04/2018 0910   CL 102 10/04/2018 0910   CO2 20 10/04/2018 0910   GLUCOSE 129 (H) 10/04/2018 0910   GLUCOSE 117 (H) 11/13/2017 0938   BUN 18 10/04/2018 0910   CREATININE 0.82 10/04/2018 0910   CALCIUM 9.7 10/04/2018 0910   GFRNONAA 91 10/04/2018 0910   GFRAA 105 10/04/2018 0910   Lab Results  Component Value Date   HGBA1C 6.5 (H) 10/04/2018   HGBA1C 6.4 (H) 05/07/2018   HGBA1C 6.9 (H) 11/13/2017   HGBA1C 6.9 (H) 08/01/2017   HGBA1C 7.5 (H) 05/08/2017   Lab Results  Component Value Date   INSULIN 2.5 (L) 10/04/2018   INSULIN 4.3 05/07/2018   INSULIN 7.9 01/24/2018   CBC    Component Value Date/Time   WBC 7.4 01/24/2018 1307   WBC 5.4 05/08/2017 1032   RBC 4.45 01/24/2018 1307   RBC 4.31 05/08/2017 1032   HGB 13.0 01/24/2018 1307   HCT 38.1 01/24/2018 1307   PLT 265 01/24/2018 1307   MCV 86 01/24/2018 1307   MCH 29.2 01/24/2018 1307   MCHC 34.1 01/24/2018 1307  MCHC 33.3 05/08/2017 1032   RDW 12.8 01/24/2018 1307    LYMPHSABS 1.2 01/24/2018 1307   MONOABS 0.5 05/08/2017 1032   EOSABS 0.4 01/24/2018 1307   BASOSABS 0.1 01/24/2018 1307   Iron/TIBC/Ferritin/ %Sat    Component Value Date/Time   IRON 102 05/08/2017 1032   IRON 102 05/08/2017 1032   FERRITIN 64.0 05/08/2017 1032   IRONPCTSAT 19.9 (L) 05/08/2017 1032   Lipid Panel     Component Value Date/Time   CHOL 221 (H) 10/04/2018 0910   TRIG 92 10/04/2018 0910   HDL 60 10/04/2018 0910   CHOLHDL 3.9 01/24/2018 1307   CHOLHDL 2 11/13/2017 0938   VLDL 11.4 11/13/2017 0938   LDLCALC 143 (H) 10/04/2018 0910   LDLDIRECT 198.6 03/20/2009 1127   Hepatic Function Panel     Component Value Date/Time   PROT 6.6 10/04/2018 0910   ALBUMIN 4.6 10/04/2018 0910   AST 14 10/04/2018 0910   ALT 10 10/04/2018 0910   ALKPHOS 38 (L) 10/04/2018 0910   BILITOT 0.4 10/04/2018 0910   BILIDIR 0.1 08/14/2014 0824      Component Value Date/Time   TSH 1.050 01/24/2018 1307   TSH 1.28 11/07/2016 1008   TSH 0.98 09/27/2011 0945     Ref. Range 05/07/2018 13:21  Vitamin D, 25-Hydroxy Latest Ref Range: 30.0 - 100.0 ng/mL 53.9    OBESITY BEHAVIORAL INTERVENTION VISIT  Today's visit was # 17   Starting weight: 301 lbs Starting date: 01/24/2018 Today's weight : 246 lbs  Today's date: 10/23/2018 Total lbs lost to date: 55    10/23/2018  Height 5\' 9"  (1.753 m)  Weight 246 lb (111.6 kg)  BMI (Calculated) 36.31  BLOOD PRESSURE - SYSTOLIC 114  BLOOD PRESSURE - DIASTOLIC 67   Body Fat % 37.8 %  Total Body Water (lbs) 125.4 lbs    ASK: We discussed the diagnosis of obesity with Zachary Drone today and Obeth agreed to give [name] permission to discuss obesity behavioral modification therapy today.  ASSESS: Jocqui has the diagnosis of obesity and his BMI today is 36.31 Fawzi is in the action stage of change   ADVISE: Miloh was educated on the multiple health risks of obesity as well as the benefit of weight loss to improve his health. He was advised  of the need for long term treatment and the importance of lifestyle modifications to improve his current health and to decrease his risk of future health problems.  AGREE: Multiple dietary modification options and treatment options were discussed and  Schylar agreed to follow the recommendations documented in the above note.  ARRANGE: Wendall was educated on the importance of frequent visits to treat obesity as outlined per CMS and USPSTF guidelines and agreed to schedule his next follow up appointment today.  I, [place] Crane, am acting as transcriptionist for Filbert Schilder, MD  I have reviewed the above documentation for accuracy and completeness, and I agree with the above. - Debbra Riding, MD

## 2018-11-02 ENCOUNTER — Telehealth: Payer: Self-pay

## 2018-11-02 NOTE — Telephone Encounter (Signed)
Virtual Visit Pre-Appointment Phone Call  "(Name), I am calling you today to discuss your upcoming appointment. We are currently trying to limit exposure to the virus that causes COVID-19 by seeing patients at home rather than in the office."  1. "What is the BEST phone number to call the day of the visit?" - include this in appointment notes  2. "Do you have or have access to (through a family member/friend) a smartphone with video capability that we can use for your visit?" a. If yes - list this number in appt notes as "cell" (if different from BEST phone #) and list the appointment type as a VIDEO visit in appointment notes b. If no - list the appointment type as a PHONE visit in appointment notes  3. Confirm consent - "In the setting of the current Covid19 crisis, you are scheduled for a (phone or video) visit with your provider on (date) at (time).  Just as we do with many in-office visits, in order for you to participate in this visit, we must obtain consent.  If you'd like, I can send this to your mychart (if signed up) or email for you to review.  Otherwise, I can obtain your verbal consent now.  All virtual visits are billed to your insurance company just like a normal visit would be.  By agreeing to a virtual visit, we'd like you to understand that the technology does not allow for your provider to perform an examination, and thus may limit your provider's ability to fully assess your condition. If your provider identifies any concerns that need to be evaluated in person, we will make arrangements to do so.  Finally, though the technology is pretty good, we cannot assure that it will always work on either your or our end, and in the setting of a video visit, we may have to convert it to a phone-only visit.  In either situation, we cannot ensure that we have a secure connection.  Are you willing to proceed?" STAFF: Did the patient verbally acknowledge consent to telehealth visit? Document  YES/NO here: yes  4. Advise patient to be prepared - "Two hours prior to your appointment, go ahead and check your blood pressure, pulse, oxygen saturation, and your weight (if you have the equipment to check those) and write them all down. When your visit starts, your provider will ask you for this information. If you have an Apple Watch or Kardia device, please plan to have heart rate information ready on the day of your appointment. Please have a pen and paper handy nearby the day of the visit as well."  5. Give patient instructions for MyChart download to smartphone OR [email or web address] as below if video visit (depending on what platform provider is using)  6. Inform patient they will receive a phone call 15 minutes prior to their appointment time (may be from unknown caller ID) so they should be prepared to answer    Zachary Michael has been deemed a candidate for a follow-up tele-health visit to limit community exposure during the Covid-19 pandemic. I spoke with the patient via phone to ensure availability of phone/video source, confirm preferred email & phone number, and discuss instructions and expectations.  I reminded Zachary Michael to be prepared with any vital sign and/or heart rhythm information that could potentially be obtained via home monitoring, at the time of his visit. I reminded Zachary Michael to expect a phone call prior to  his visit.  [name]  [name], [facility] Sugar LandCMA 11/02/2018 11:14 AM   INSTRUCTIONS FOR DOWNLOADING THE MYCHART APP TO SMARTPHONE  - The patient must first make sure to have activated MyChart and know their login information - If Apple, go to [employer] Store and type in MyChart in the search bar and download the app. If Android, ask patient to go to [employer] Play Store and type in [facility] in the search bar and download the app. The app is free but as with any other app downloads, their phone may require them to verify saved payment information or  Apple/Android password.  - The patient will need to then log into the app with their MyChart username and password, and select [facility] as their healthcare provider to link the account. When it is time for your visit, go to the MyChart app, find appointments, and click Begin Video Visit. Be sure to Select Allow for your device to access the Microphone and Camera for your visit. You will then be connected, and your provider will be with you shortly.  **If they have any issues connecting, or need assistance please contact MyChart service desk [phone])**  **If using a computer, in order to ensure the best quality for their visit they will need to use either of the following Internet Browsers: [employer] Edge, or Google Chrome**  IF USING DOXIMITY or DOXY.ME - The patient will receive a link just prior to their visit by text.     FULL LENGTH CONSENT FOR TELE-HEALTH VISIT   I hereby voluntarily request, consent and authorize [facility] and its employed or contracted physicians, physician assistants, nurse practitioners or other licensed health care professionals (the Practitioner), to provide me with telemedicine health care services (the "Services") as deemed necessary by the treating Practitioner. I acknowledge and consent to receive the Services by the Practitioner via telemedicine. I understand that the telemedicine visit will involve communicating with the Practitioner through live audiovisual communication technology and the disclosure of certain medical information by electronic transmission. I acknowledge that I have been given the opportunity to request an in-person assessment or other available alternative prior to the telemedicine visit and am voluntarily participating in the telemedicine visit.  I understand that I have the right to withhold or withdraw my consent to the use of telemedicine in the course of my care at any time, without affecting my right to future care  or treatment, and that the Practitioner or I may terminate the telemedicine visit at any time. I understand that I have the right to inspect all information obtained and/or recorded in the course of the telemedicine visit and may receive copies of available information for a reasonable fee.  I understand that some of the potential risks of receiving the Services via telemedicine include:  [name]. Delay or interruption in medical evaluation due to technological equipment failure or disruption; . Information transmitted may not be sufficient (e.g. poor resolution of images) to allow for appropriate medical decision making by the Practitioner; and/or  . In rare instances, security protocols could fail, causing a breach of personal health information.  Furthermore, I acknowledge that it is my responsibility to provide information about my medical history, conditions and care that is complete and accurate to the best of my ability. I acknowledge that Practitioner's advice, recommendations, and/or decision may be based on factors not within their control, such as incomplete or inaccurate data provided by me or distortions of diagnostic images or specimens that may result from electronic transmissions.  I understand that the practice of medicine is not an exact science and that Practitioner makes no warranties or guarantees regarding treatment outcomes. I acknowledge that I will receive a copy of this consent concurrently upon execution via email to the email address I last provided but may also request a printed copy by calling the office of [facility].    I understand that my insurance will be billed for this visit.   I have read or had this consent read to me. . I understand the contents of this consent, which adequately explains the benefits and risks of the Services being provided via telemedicine.  . I have been provided ample opportunity to ask questions regarding this consent and the Services and have had  my questions answered to my satisfaction. . I give my informed consent for the services to be provided through the use of telemedicine in my medical care  By participating in this telemedicine visit I agree to the above.

## 2018-11-06 ENCOUNTER — Other Ambulatory Visit: Payer: Self-pay

## 2018-11-06 ENCOUNTER — Ambulatory Visit (INDEPENDENT_AMBULATORY_CARE_PROVIDER_SITE_OTHER): Payer: Medicare Other | Admitting: Family Medicine

## 2018-11-06 ENCOUNTER — Telehealth: Payer: Self-pay

## 2018-11-06 ENCOUNTER — Encounter: Payer: Medicare Other | Admitting: Cardiovascular Disease

## 2018-11-06 NOTE — Progress Notes (Signed)
error    This encounter was created in error - please disregard.

## 2018-11-06 NOTE — Telephone Encounter (Signed)
Outreach made to Pt.  Call went to VM

## 2018-11-19 ENCOUNTER — Ambulatory Visit (INDEPENDENT_AMBULATORY_CARE_PROVIDER_SITE_OTHER): Payer: Medicare Other | Admitting: Family Medicine

## 2018-11-19 ENCOUNTER — Other Ambulatory Visit: Payer: Self-pay

## 2018-11-19 ENCOUNTER — Encounter: Payer: Self-pay | Admitting: Family Medicine

## 2018-11-19 VITALS — BP 119/57 | HR 63 | Temp 98.1°F | Ht 69.0 in | Wt 244.0 lb

## 2018-11-19 DIAGNOSIS — R0602 Shortness of breath: Secondary | ICD-10-CM

## 2018-11-19 DIAGNOSIS — Z6836 Body mass index (BMI) 36.0-36.9, adult: Secondary | ICD-10-CM | POA: Diagnosis not present

## 2018-11-19 DIAGNOSIS — E1165 Type 2 diabetes mellitus with hyperglycemia: Secondary | ICD-10-CM

## 2018-11-19 DIAGNOSIS — I2510 Atherosclerotic heart disease of native coronary artery without angina pectoris: Secondary | ICD-10-CM

## 2018-11-19 DIAGNOSIS — E7849 Other hyperlipidemia: Secondary | ICD-10-CM

## 2018-11-20 ENCOUNTER — Encounter: Payer: Self-pay | Admitting: Family Medicine

## 2018-11-20 ENCOUNTER — Ambulatory Visit (INDEPENDENT_AMBULATORY_CARE_PROVIDER_SITE_OTHER): Payer: Medicare Other | Admitting: Family Medicine

## 2018-11-20 VITALS — BP 140/78 | HR 57 | Temp 97.5°F | Resp 18 | Ht 69.0 in | Wt 248.6 lb

## 2018-11-20 DIAGNOSIS — E785 Hyperlipidemia, unspecified: Secondary | ICD-10-CM

## 2018-11-20 DIAGNOSIS — D649 Anemia, unspecified: Secondary | ICD-10-CM | POA: Diagnosis not present

## 2018-11-20 DIAGNOSIS — Z1211 Encounter for screening for malignant neoplasm of colon: Secondary | ICD-10-CM

## 2018-11-20 DIAGNOSIS — E1169 Type 2 diabetes mellitus with other specified complication: Secondary | ICD-10-CM | POA: Diagnosis not present

## 2018-11-20 DIAGNOSIS — I2510 Atherosclerotic heart disease of native coronary artery without angina pectoris: Secondary | ICD-10-CM

## 2018-11-20 DIAGNOSIS — I10 Essential (primary) hypertension: Secondary | ICD-10-CM

## 2018-11-20 LAB — CBC WITH DIFFERENTIAL/PLATELET
Basophils Absolute: 0.1 10*3/uL (ref 0.0–0.1)
Basophils Relative: 1.1 % (ref 0.0–3.0)
Eosinophils Absolute: 0.3 10*3/uL (ref 0.0–0.7)
Eosinophils Relative: 4 % (ref 0.0–5.0)
HCT: 39 % (ref 39.0–52.0)
Hemoglobin: 13.2 g/dL (ref 13.0–17.0)
Lymphocytes Relative: 17.9 % (ref 12.0–46.0)
Lymphs Abs: 1.3 10*3/uL (ref 0.7–4.0)
MCHC: 33.9 g/dL (ref 30.0–36.0)
MCV: 87 fl (ref 78.0–100.0)
Monocytes Absolute: 0.5 10*3/uL (ref 0.1–1.0)
Monocytes Relative: 6.6 % (ref 3.0–12.0)
Neutro Abs: 5.1 10*3/uL (ref 1.4–7.7)
Neutrophils Relative %: 70.4 % (ref 43.0–77.0)
Platelets: 236 10*3/uL (ref 150.0–400.0)
RBC: 4.49 Mil/uL (ref 4.22–5.81)
RDW: 13.5 % (ref 11.5–15.5)
WBC: 7.3 10*3/uL (ref 4.0–10.5)

## 2018-11-20 LAB — IBC + FERRITIN
Ferritin: 74.9 ng/mL (ref 22.0–322.0)
Iron: 95 ug/dL (ref 42–165)
Saturation Ratios: 23 % (ref 20.0–50.0)
Transferrin: 295 mg/dL (ref 212.0–360.0)

## 2018-11-20 NOTE — Assessment & Plan Note (Signed)
hgba1c acceptable, minimize simple carbs. Increase exercise as tolerated. Continue current meds Lab Results  Component Value Date   HGBA1C 6.5 (H) 10/04/2018

## 2018-11-20 NOTE — Progress Notes (Signed)
Patient ID: Zachary Michael, male    DOB: 07-13-1950  Age: 68 y.o. MRN: [id number]    Subjective:  Subjective  HPI DANZIG HELMKAMP presents for f/u dm, chol and bp.   He also had an episode of low bs in May --- he was in [place].  He was also found to be anemic --- pt is still seeing healthy weight and wellness.   HYPERTENSION   Blood pressure range-good per pt   Chest pain- no      Dyspnea- no Lightheadedness- no   Edema- no  Other side effects - no   Medication compliance: good Low salt diet- yes     DIABETES    Blood Sugar ranges-good per pt   Polyuria- no New Visual problems- no  Hypoglycemic symptoms- no  Other side effects-no Medication compliance - good Last eye exam- due   HYPERLIPIDEMIA  Medication compliance- good  RUQ pain- no  Muscle aches- no Other side effects-no       Review of Systems  Constitutional: Negative for appetite change, diaphoresis, fatigue and unexpected weight change.  Eyes: Negative for pain, redness and visual disturbance.  Respiratory: Negative for cough, chest tightness, shortness of breath and wheezing.   Cardiovascular: Negative for chest pain, palpitations and leg swelling.  Endocrine: Negative for cold intolerance, heat intolerance, polydipsia, polyphagia and polyuria.  Genitourinary: Negative for difficulty urinating, dysuria and frequency.  Neurological: Negative for dizziness, light-headedness, numbness and headaches.    History Past Medical History:  Diagnosis Date  . Asthma   . Back pain   . CAD (coronary artery disease)    Mild  . Diabetes mellitus   . Heart valve problem   . HTN (hypertension)   . Hyperlipidemia   . Joint pain   . OSA (obstructive sleep apnea) 08/09/2017  . Palpitations   . SOB (shortness of breath)   . Subluxation    L2-3-4    He has a past surgical history that includes Appendectomy; Tonsillectomy; Tumor removal; Cardiac catheterization; and Cardiovascular stress test.   His family history  includes Arthritis in his mother; Cancer in his brother and father; Diabetes in his maternal aunt and mother; Hypertension in his maternal aunt, maternal aunt, and mother; Lung cancer in his unknown relative; Osteoporosis in his mother; Pneumonia in his mother.He reports that he quit smoking about 40 years ago. His smoking use included pipe. He quit after 7.00 years of use. He has never used smokeless tobacco. He reports current alcohol use. He reports that he does not use drugs.  Current Outpatient Medications on File Prior to Visit  Medication Sig Dispense Refill  . aspirin 81 MG tablet Take 81 mg by mouth daily.      [name] b complex vitamins tablet Take 1 tablet by mouth daily.    . calcium carbonate (CALCIUM 600) 600 MG TABS tablet Take 600 mg by mouth daily with breakfast.    . carvedilol (COREG) 6.25 MG tablet Take 1 tablet (6.25 mg total) by mouth 2 (two) times daily with a meal. 60 tablet 0  . Cinnamon 500 MG capsule Take 500 mg by mouth daily.    [name] co-enzyme Q-10 30 MG capsule Take 30 mg by mouth 3 (three) times daily.    . fenofibrate 160 MG tablet Take 1 tablet (160 mg total) by mouth daily. 30 tablet 0  . ferrous sulfate 325 (65 FE) MG EC tablet Take 325 mg by mouth 3 (three) times daily with meals.    [name]  glucose blood (ONETOUCH VERIO) test strip Use as instructed 100 each 0  . lisinopril (ZESTRIL) 10 MG tablet Take 5 mg by mouth daily.    . metFORMIN (GLUCOPHAGE) 1000 MG tablet TAKE 1 TABLET (1,000 MG TOTAL) BY MOUTH 2 (TWO) TIMES DAILY WITH A MEAL. 180 tablet 1  . Multiple Vitamin (MULTIVITAMIN) capsule Take 1 capsule by mouth daily.    . nitroGLYCERIN (NITROSTAT) 0.4 MG SL tablet Place 1 tablet (0.4 mg total) under the tongue every 5 (five) minutes as needed for chest pain. 25 tablet 5  . simvastatin (ZOCOR) 40 MG tablet TAKE 1 TABLET BY MOUTH EVERYDAY AT BEDTIME 30 tablet 0  . Zinc 100 MG TABS Take 1 tablet by mouth daily.    [name] albuterol (PROAIR HFA) 108 (90 BASE) MCG/ACT inhaler Inhale  2 puffs into the lungs every 6 (six) hours as needed for wheezing or shortness of breath. (Patient not taking: Reported on 11/20/2018) 1 Inhaler 4  . fluticasone (FLONASE) 50 MCG/ACT nasal spray Place 2 sprays into both nostrils daily. (Patient not taking: Reported on 11/20/2018) 16 g 6  . loratadine (CLARITIN) 10 MG tablet Take 1 tablet (10 mg total) by mouth daily. (Patient not taking: Reported on 11/20/2018) 30 tablet 11   No current facility-administered medications on file prior to visit.      Objective:  Objective  Physical Exam Vitals signs and nursing note reviewed.  Constitutional:      General: He is sleeping.     Appearance: He is well-developed.  HENT:     Head: Normocephalic and atraumatic.  Eyes:     Pupils: Pupils are equal, round, and reactive to light.  Neck:     Musculoskeletal: Normal range of motion and neck supple.     Thyroid: No thyromegaly.  Cardiovascular:     Rate and Rhythm: Normal rate and regular rhythm.     Heart sounds: No murmur.  Pulmonary:     Effort: Pulmonary effort is normal. No respiratory distress.     Breath sounds: Normal breath sounds. No wheezing or rales.  Chest:     Chest wall: No tenderness.  Musculoskeletal:        General: No tenderness.  Skin:    General: Skin is warm and dry.  Neurological:     Mental Status: He is oriented to person, place, and time.  Psychiatric:        Behavior: Behavior normal.        Thought Content: Thought content normal.        Judgment: Judgment normal.    BP 140/78 (BP Location: Right Arm, Patient Position: Sitting, Cuff Size: Normal)   Pulse (!) 57   Temp (!) 97.5 F (36.4 C) (Temporal)   Resp 18   Ht 5\' 9"  (1.753 m)   Wt 248 lb 9.6 oz (112.8 kg)   SpO2 97%   BMI 36.71 kg/m  Wt Readings from Last 3 Encounters:  11/20/18 248 lb 9.6 oz (112.8 kg)  11/19/18 244 lb (110.7 kg)  10/23/18 246 lb (111.6 kg)     Lab Results  Component Value Date   WBC 7.4 01/24/2018   HGB 13.0 01/24/2018    HCT 38.1 01/24/2018   PLT 265 01/24/2018   GLUCOSE 129 (H) 10/04/2018   CHOL 221 (H) 10/04/2018   TRIG 92 10/04/2018   HDL 60 10/04/2018   LDLDIRECT 198.6 03/20/2009   LDLCALC 143 (H) 10/04/2018   ALT 10 10/04/2018   AST 14 10/04/2018  NA 139 10/04/2018   K 4.2 10/04/2018   CL 102 10/04/2018   CREATININE 0.82 10/04/2018   BUN 18 10/04/2018   CO2 20 10/04/2018   TSH 1.050 01/24/2018   PSA 3.68 11/07/2016   INR 1.0 06/02/2008   HGBA1C 6.5 (H) 10/04/2018   MICROALBUR 1.2 11/06/2014    Dg Chest 2 View  Result Date: 11/06/2014 CLINICAL DATA:  Cough, wheezing for hours EXAM: CHEST  2 VIEW COMPARISON:  06/02/2008 FINDINGS: The heart size and mediastinal contours are within normal limits. Both lungs are clear. The visualized skeletal structures are unremarkable. IMPRESSION: No active cardiopulmonary disease. Electronically Signed   By: Elige Ko   On: 11/06/2014 15:11     Assessment & Plan:  Plan  I am having [name] maintain his aspirin, nitroGLYCERIN, albuterol, multivitamin, calcium carbonate, ferrous sulfate, b complex vitamins, co-enzyme Q-10, Cinnamon, Zinc, glucose blood, fluticasone, loratadine, metFORMIN, simvastatin, fenofibrate, carvedilol, and lisinopril.  No orders of the defined types were placed in this encounter.   Problem List Items Addressed This Visit      Unprioritized   Diabetes mellitus type II, controlled (HCC)    hgba1c acceptable, minimize simple carbs. Increase exercise as tolerated. Continue current meds Lab Results  Component Value Date   HGBA1C 6.5 (H) 10/04/2018         Hyperlipidemia    Tolerating statin, encouraged heart healthy diet, avoid trans fats, minimize simple carbs and saturated fats. Increase exercise as tolerated      Hypertension    Well controlled, no changes to meds. Encouraged heart healthy diet such as the DASH diet and exercise as tolerated.        Other Visit Diagnoses    Anemia, unspecified type    -   Primary   Relevant Orders   CBC with Differential/Platelet   IBC + Ferritin   IFOBT POC (occult bld, rslt in office)   Colon cancer screening       Relevant Orders   Ambulatory referral to Gastroenterology      Follow-up: Return in about 6 months (around 05/20/2019), or if symptoms worsen or fail to improve.  Donato Schultz, DO

## 2018-11-20 NOTE — Assessment & Plan Note (Signed)
Well controlled, no changes to meds. Encouraged heart healthy diet such as the DASH diet and exercise as tolerated.  °

## 2018-11-20 NOTE — Assessment & Plan Note (Signed)
Tolerating statin, encouraged heart healthy diet, avoid trans fats, minimize simple carbs and saturated fats. Increase exercise as tolerated 

## 2018-11-20 NOTE — Patient Instructions (Signed)

## 2018-11-21 NOTE — Progress Notes (Signed)
Office: [phone]  /  Fax: [phone]   HPI:   Chief Complaint: OBESITY Zachary Michael is here to discuss his progress with his obesity treatment plan. He is on the keep a food journal with 1450-1600 calories and 90+ grams of protein daily or follow the Category 3 plan and is following his eating plan approximately 95 % of the time. He states he is walking 2/3 mile 7 times per week, and yard work for 60 minutes 3 times per week. Zachary Michael voices the last month has been very stressful with middle daughter acting out. He voices that his schedule has been completely thrown off and he hasn't been able to commit to the meal plan. He is almost always getting in all protein.  His weight is 244 lb (110.7 kg) today and has had a weight loss of 2 pounds over a period of 4 weeks since his last visit. He has lost 57 lbs since starting treatment with us.  Diabetes II with Hyperglycemia Zachary Michael has a diagnosis of diabetes type II. Zachary Michael is only on metformin, and he is also on ACE, aspirin, and statin. He denies feelings of hypoglycemia. Last A1c was 6.5. He has been working on intensive lifestyle modifications including diet, exercise, and weight loss to help control his blood glucose levels.  Hyperlipidemia Zachary Michael has hyperlipidemia and has been trying to improve his cholesterol levels with intensive lifestyle modification including a low saturated fat diet, exercise and weight loss. Last LDL was of 143, HDL of 60, and triglycerides of 92. He is on statin and goal is <70 for LDL. He denies any chest pain, claudication or myalgias.  Shortness of Breath with Exertion Zachary Michael notes increasing shortness of breath with exercising. His symptoms have changed from his initial first appointment. He is on albuterol as needed. Zachary Michael denies shortness of breath at rest or orthopnea.  ASSESSMENT AND PLAN:  Type 2 diabetes mellitus with hyperglycemia, without long-term current use of insulin (HCC)  Other  hyperlipidemia  Shortness of breath on exertion  Class 2 severe obesity with serious comorbidity and body mass index (BMI) of 36.0 to 36.9 in adult, unspecified obesity type (HCC)  PLAN:  Diabetes II with Hyperglycemia Zachary Michael has been given extensive diabetes education by myself today including ideal fasting and post-prandial blood glucose readings, individual ideal Hgb A1c goals and hypoglycemia prevention. We discussed the importance of good blood sugar control to decrease the likelihood of diabetic complications such as nephropathy, neuropathy, limb loss, blindness, coronary artery disease, and death. We discussed the importance of intensive lifestyle modification including diet, exercise and weight loss as the first line treatment for diabetes. Zachary Michael agrees to continue his current diabetes medications, and he agrees to follow up with our clinic in 2 weeks.  Hyperlipidemia Zachary Michael was informed of the American Heart Association Guidelines emphasizing intensive lifestyle modifications as the first line treatment for hyperlipidemia. We discussed many lifestyle modifications today in depth, and Zachary Michael will continue to work on decreasing saturated fats such as fatty red meat, butter and many fried foods. He will also increase vegetables and lean protein in his diet and continue to work on exercise and weight loss efforts. Zachary Michael agrees to continue taking statin, and we will repeat labs in mid November or early December. Zachary Michael agrees to follow up with our clinic in 2 weeks.  Shortness of Breath with Exertion Zachary Michael's shortness of breath appears to be obesity related and exercise induced. The indirect calorimeter results showed VO2 of 269 and a REE of  1875. He has agreed to work on weight loss and gradually increase exercise to treat his exercise induced shortness of breath. If Zachary Michael follows our instructions and loses weight without improvement of his shortness of breath, we will plan to refer  to pulmonology. Zachary Michael agrees to this plan.  Obesity Zachary Michael is currently in the action stage of change. As such, his goal is to continue with weight loss efforts He has agreed to keep a food journal with 1450-1550 calories and 90+ grams of protein daily Zachary Michael has been instructed to work up to a goal of 150 minutes of combined cardio and strengthening exercise per week or increase physical activity to 4 times per week for weight loss and overall health benefits. We discussed the following Behavioral Modification Strategies today: increasing lean protein intake, increasing vegetables and work on meal planning and easy cooking plans, keeping healthy foods in the home, better snacking choices, and planning for success   Zachary Michael has agreed to follow up with our clinic in 2 weeks. He was informed of the importance of frequent follow up visits to maximize his success with intensive lifestyle modifications for his multiple health conditions.  ALLERGIES: No Known Allergies  MEDICATIONS: Current Outpatient Medications on File Prior to Visit  Medication Sig Dispense Refill  . albuterol (PROAIR HFA) 108 (90 BASE) MCG/ACT inhaler Inhale 2 puffs into the lungs every 6 (six) hours as needed for wheezing or shortness of breath. (Patient not taking: Reported on 11/20/2018) 1 Inhaler 4  . aspirin 81 MG tablet Take 81 mg by mouth daily.      [name] b complex vitamins tablet Take 1 tablet by mouth daily.    . calcium carbonate (CALCIUM 600) 600 MG TABS tablet Take 600 mg by mouth daily with breakfast.    . carvedilol (COREG) 6.25 MG tablet Take 1 tablet (6.25 mg total) by mouth 2 (two) times daily with a meal. 60 tablet 0  . Cinnamon 500 MG capsule Take 500 mg by mouth daily.    [name] co-enzyme Q-10 30 MG capsule Take 30 mg by mouth 3 (three) times daily.    . fenofibrate 160 MG tablet Take 1 tablet (160 mg total) by mouth daily. 30 tablet 0  . ferrous sulfate 325 (65 FE) MG EC tablet Take 325 mg by mouth 3 (three)  times daily with meals.    . fluticasone (FLONASE) 50 MCG/ACT nasal spray Place 2 sprays into both nostrils daily. (Patient not taking: Reported on 11/20/2018) 16 g 6  . glucose blood (ONETOUCH VERIO) test strip Use as instructed 100 each 0  . lisinopril (ZESTRIL) 10 MG tablet Take 5 mg by mouth daily.    [name] loratadine (CLARITIN) 10 MG tablet Take 1 tablet (10 mg total) by mouth daily. (Patient not taking: Reported on 11/20/2018) 30 tablet 11  . metFORMIN (GLUCOPHAGE) 1000 MG tablet TAKE 1 TABLET (1,000 MG TOTAL) BY MOUTH 2 (TWO) TIMES DAILY WITH A MEAL. 180 tablet 1  . Multiple Vitamin (MULTIVITAMIN) capsule Take 1 capsule by mouth daily.    . nitroGLYCERIN (NITROSTAT) 0.4 MG SL tablet Place 1 tablet (0.4 mg total) under the tongue every 5 (five) minutes as needed for chest pain. 25 tablet 5  . simvastatin (ZOCOR) 40 MG tablet TAKE 1 TABLET BY MOUTH EVERYDAY AT BEDTIME 30 tablet 0  . Zinc 100 MG TABS Take 1 tablet by mouth daily.     No current facility-administered medications on file prior to visit.     PAST MEDICAL  HISTORY: Past Medical History:  Diagnosis Date  . Asthma   . Back pain   . CAD (coronary artery disease)    Mild  . Diabetes mellitus   . Heart valve problem   . HTN (hypertension)   . Hyperlipidemia   . Joint pain   . OSA (obstructive sleep apnea) 08/09/2017  . Palpitations   . SOB (shortness of breath)   . Subluxation    L2-3-4    PAST SURGICAL HISTORY: Past Surgical History:  Procedure Laterality Date  . APPENDECTOMY    . CARDIAC CATHETERIZATION     We recently performed a which revealed minor coronary artery irregularities. He had normal left ventricle systolic function with an EF of 60%  . CARDIOVASCULAR STRESS TEST     EF 63%  . TONSILLECTOMY    . TUMOR REMOVAL     LEFT ARM    SOCIAL HISTORY: Social History   Tobacco Use  . Smoking status: Former Smoker    Years: 7.00    Types: Pipe    Quit date: 1980    Years since quitting: 40.7  . Smokeless  tobacco: Never Used  Substance Use Topics  . Alcohol use: Yes    Comment: occassional  . Drug use: No    FAMILY HISTORY: Family History  Problem Relation Age of Onset  . Pneumonia Mother   . Osteoporosis Mother   . Arthritis Mother   . Diabetes Mother   . Hypertension Mother   . Cancer Father        leukemia, lung  . Cancer Brother   . Diabetes Maternal Aunt   . Hypertension Maternal Aunt   . Hypertension Maternal Aunt   . Lung cancer Unknown     ROS: Review of Systems  Constitutional: Positive for weight loss.  Respiratory: Positive for shortness of breath (with exertion).   Cardiovascular: Negative for chest pain, orthopnea and claudication.  Musculoskeletal: Negative for myalgias.  Endo/Heme/Allergies:       Negative hypoglycemia    PHYSICAL EXAM: Blood pressure (!) 119/57, pulse 63, temperature 98.1 F (36.7 C), temperature source Oral, height 5\' 9"  (1.753 m), weight 244 lb (110.7 kg), SpO2 98 %. Body mass index is 36.03 kg/m. Physical Exam Vitals signs reviewed.  Constitutional:      Appearance: Normal appearance. He is obese.  Cardiovascular:     Rate and Rhythm: Normal rate.     Pulses: Normal pulses.  Pulmonary:     Effort: Pulmonary effort is normal.     Breath sounds: Normal breath sounds.  Musculoskeletal: Normal range of motion.  Skin:    General: Skin is warm and dry.  Neurological:     Mental Status: He is alert and oriented to person, place, and time.  Psychiatric:        Mood and Affect: Mood normal.        Behavior: Behavior normal.     RECENT LABS AND TESTS: BMET    Component Value Date/Time   NA 139 10/04/2018 0910   K 4.2 10/04/2018 0910   CL 102 10/04/2018 0910   CO2 20 10/04/2018 0910   GLUCOSE 129 (H) 10/04/2018 0910   GLUCOSE 117 (H) 11/13/2017 0938   BUN 18 10/04/2018 0910   CREATININE 0.82 10/04/2018 0910   CALCIUM 9.7 10/04/2018 0910   GFRNONAA 91 10/04/2018 0910   GFRAA 105 10/04/2018 0910   Lab Results   Component Value Date   HGBA1C 6.5 (H) 10/04/2018   HGBA1C 6.4 (H) 05/07/2018  HGBA1C 6.9 (H) 11/13/2017   HGBA1C 6.9 (H) 08/01/2017   HGBA1C 7.5 (H) 05/08/2017   Lab Results  Component Value Date   INSULIN 2.5 (L) 10/04/2018   INSULIN 4.3 05/07/2018   INSULIN 7.9 01/24/2018   CBC    Component Value Date/Time   WBC 7.3 11/20/2018 0958   RBC 4.49 11/20/2018 0958   HGB 13.2 11/20/2018 0958   HGB 13.0 01/24/2018 1307   HCT 39.0 11/20/2018 0958   HCT 38.1 01/24/2018 1307   PLT 236.0 11/20/2018 0958   PLT 265 01/24/2018 1307   MCV 87.0 11/20/2018 0958   MCV 86 01/24/2018 1307   MCH 29.2 01/24/2018 1307   MCHC 33.9 11/20/2018 0958   RDW 13.5 11/20/2018 0958   RDW 12.8 01/24/2018 1307   LYMPHSABS 1.3 11/20/2018 0958   LYMPHSABS 1.2 01/24/2018 1307   MONOABS 0.5 11/20/2018 0958   EOSABS 0.3 11/20/2018 0958   EOSABS 0.4 01/24/2018 1307   BASOSABS 0.1 11/20/2018 0958   BASOSABS 0.1 01/24/2018 1307   Iron/TIBC/Ferritin/ %Sat    Component Value Date/Time   IRON 95 11/20/2018 0958   FERRITIN 74.9 11/20/2018 0958   IRONPCTSAT 23.0 11/20/2018 0958   Lipid Panel     Component Value Date/Time   CHOL 221 (H) 10/04/2018 0910   TRIG 92 10/04/2018 0910   HDL 60 10/04/2018 0910   CHOLHDL 3.9 01/24/2018 1307   CHOLHDL 2 11/13/2017 0938   VLDL 11.4 11/13/2017 0938   LDLCALC 143 (H) 10/04/2018 0910   LDLDIRECT 198.6 03/20/2009 1127   Hepatic Function Panel     Component Value Date/Time   PROT 6.6 10/04/2018 0910   ALBUMIN 4.6 10/04/2018 0910   AST 14 10/04/2018 0910   ALT 10 10/04/2018 0910   ALKPHOS 38 (L) 10/04/2018 0910   BILITOT 0.4 10/04/2018 0910   BILIDIR 0.1 08/14/2014 0824      Component Value Date/Time   TSH 1.050 01/24/2018 1307   TSH 1.28 11/07/2016 1008   TSH 0.98 09/27/2011 0945      OBESITY BEHAVIORAL INTERVENTION VISIT  Today's visit was # 18   Starting weight: 301 lbs Starting date: 01/24/18 Today's weight : 244 lbs  Today's date: 11/19/2018  Total lbs lost to date: 57 At least 15 minutes were spent on discussing the following behavioral intervention visit.   ASK: We discussed the diagnosis of obesity with Zachary Michael today and Zachary Michael agreed to give [name] permission to discuss obesity behavioral modification therapy today.  ASSESS: Zachary Michael has the diagnosis of obesity and his BMI today is 36.02 Zachary Michael is in the action stage of change   ADVISE: Zachary Michael was educated on the multiple health risks of obesity as well as the benefit of weight loss to improve his health. He was advised of the need for long term treatment and the importance of lifestyle modifications to improve his current health and to decrease his risk of future health problems.  AGREE: Multiple dietary modification options and treatment options were discussed and  Zachary Michael agreed to follow the recommendations documented in the above note.  ARRANGE: Zachary Michael was educated on the importance of frequent visits to treat obesity as outlined per CMS and USPSTF guidelines and agreed to schedule his next follow up appointment today.  I, Zachary Michael, am acting as transcriptionist for Zachary Riding, MD  I have reviewed the above documentation for accuracy and completeness, and I agree with the above. - Zachary Riding, MD

## 2018-12-03 ENCOUNTER — Other Ambulatory Visit: Payer: Self-pay

## 2018-12-03 ENCOUNTER — Ambulatory Visit (INDEPENDENT_AMBULATORY_CARE_PROVIDER_SITE_OTHER): Payer: Medicare Other | Admitting: Family Medicine

## 2018-12-03 ENCOUNTER — Encounter (INDEPENDENT_AMBULATORY_CARE_PROVIDER_SITE_OTHER): Payer: Self-pay | Admitting: Family Medicine

## 2018-12-03 VITALS — BP 103/59 | HR 65 | Temp 98.0°F | Ht 69.0 in | Wt 241.0 lb

## 2018-12-03 DIAGNOSIS — E7849 Other hyperlipidemia: Secondary | ICD-10-CM

## 2018-12-03 DIAGNOSIS — Z6835 Body mass index (BMI) 35.0-35.9, adult: Secondary | ICD-10-CM | POA: Diagnosis not present

## 2018-12-03 DIAGNOSIS — Z9189 Other specified personal risk factors, not elsewhere classified: Secondary | ICD-10-CM | POA: Diagnosis not present

## 2018-12-03 DIAGNOSIS — I2510 Atherosclerotic heart disease of native coronary artery without angina pectoris: Secondary | ICD-10-CM

## 2018-12-03 DIAGNOSIS — D649 Anemia, unspecified: Secondary | ICD-10-CM

## 2018-12-03 MED ORDER — FENOFIBRATE 160 MG PO TABS: 160.0000 mg | ORAL_TABLET | Freq: Every day | ORAL | 0 refills | Status: DC

## 2018-12-03 MED ORDER — SIMVASTATIN 40 MG PO TABS: ORAL_TABLET | ORAL | 0 refills | Status: DC

## 2018-12-04 NOTE — Progress Notes (Signed)
Office: [phone]  /  Fax: [phone]   HPI:   Chief Complaint: OBESITY Zachary Michael is here to discuss his progress with his obesity treatment plan. He is on the keep a food journal with 1450-1550 calories and 90+ grams of protein daily and is following his eating plan approximately 90-95 % of the time. He states he is walking 1-1 1/4 mile 7 times per week, and yard work 60-90 minutes 3-4 times per week. Zachary Michael recommitted to journaling and is staying within calories, and is getting protein. He is always hitting protein but has been low in calories than allotment (probably averaging around 1350 calories).  His weight is 241 lb (109.3 kg) today and has had a weight loss of 3 pounds over a period of 2 weeks since his last visit. He has lost 60 lbs since starting treatment with [name].  Hyperlipidemia Zachary Michael has hyperlipidemia and has been trying to improve his cholesterol levels with intensive lifestyle modification including a low saturated fat diet, exercise and weight loss. Last LDL was of 143 and he is not on statin. He denies any chest pain, claudication or myalgias.  At risk for cardiovascular disease Zachary Michael is at a higher than average risk for cardiovascular disease due to obesity and hyperlipidemia. He currently denies any chest pain.  Anemia Zachary Michael has a diagnosis of anemia. Last labs were done on 11/20/2018, all within normal limits. Hemoccult pending and he notes fatigue.  ASSESSMENT AND PLAN:  Other hyperlipidemia - Plan: simvastatin (ZOCOR) 40 MG tablet, fenofibrate 160 MG tablet  Anemia, unspecified type  At risk for heart disease  Class 2 severe obesity with serious comorbidity and body mass index (BMI) of 35.0 to 35.9 in adult, unspecified obesity type (HCC)  PLAN:  Hyperlipidemia Zachary Michael was informed of the American Heart Association Guidelines emphasizing intensive lifestyle modifications as the first line treatment for hyperlipidemia. We discussed many lifestyle  modifications today in depth, and Zachary Michael will continue to work on decreasing saturated fats such as fatty red meat, butter and many fried foods. He will also increase vegetables and lean protein in his diet and continue to work on exercise and weight loss efforts. Zachary Michael agrees to continue taking fenofibrate 160 mg PO daily #30 and we will refill for 1 month; he agrees to continue taking Simvastatin 40 mg PO daily #30 and we will refill for 1 month. Eddrick agrees to follow up with our clinic in 2 weeks.  Cardiovascular risk counseling Zachary Michael was given extended (15 minutes) coronary artery disease prevention counseling today. He is 68 y.o. male and has risk factors for heart disease including obesity and hyperlipidemia. We discussed intensive lifestyle modifications today with an emphasis on specific weight loss instructions and strategies. Pt was also informed of the importance of increasing exercise and decreasing saturated fats to help prevent heart disease.  Anemia The diagnosis of anemia was discussed with Zachary Michael and was explained in detail. He was given suggestions of iron rich foods. We will follow up on hemoccult (patient has yet to do test). Zachary Michael agrees to follow up with our clinic in 2 weeks.  Obesity Zachary Michael is currently in the action stage of change. As such, his goal is to continue with weight loss efforts He has agreed to keep a food journal with 1450-1550 calories and 90+ grams of protein daily Zachary Michael has been instructed to work up to a goal of 150 minutes of combined cardio and strengthening exercise per week for weight loss and overall health benefits. We discussed  the following Behavioral Modification Strategies today: increasing lean protein intake, increasing vegetables and work on meal planning and easy cooking plans, keeping healthy foods in the home, planning for success, and keep a strict food journal   Zachary Michael has agreed to follow up with our clinic in 2 weeks. He was  informed of the importance of frequent follow up visits to maximize his success with intensive lifestyle modifications for his multiple health conditions.  ALLERGIES: No Known Allergies  MEDICATIONS: Current Outpatient Medications on File Prior to Visit  Medication Sig Dispense Refill   albuterol (PROAIR HFA) 108 (90 BASE) MCG/ACT inhaler Inhale 2 puffs into the lungs every 6 (six) hours as needed for wheezing or shortness of breath. 1 Inhaler 4   aspirin 81 MG tablet Take 81 mg by mouth daily.       b complex vitamins tablet Take 1 tablet by mouth daily.     calcium carbonate (CALCIUM 600) 600 MG TABS tablet Take 600 mg by mouth daily with breakfast.     carvedilol (COREG) 6.25 MG tablet Take 1 tablet (6.25 mg total) by mouth 2 (two) times daily with a meal. 60 tablet 0   Cinnamon 500 MG capsule Take 500 mg by mouth daily.     co-enzyme Q-10 30 MG capsule Take 30 mg by mouth 3 (three) times daily.     ferrous sulfate 325 (65 FE) MG EC tablet Take 325 mg by mouth 3 (three) times daily with meals.     fluticasone (FLONASE) 50 MCG/ACT nasal spray Place 2 sprays into both nostrils daily. 16 g 6   glucose blood (ONETOUCH VERIO) test strip Use as instructed 100 each 0   lisinopril (ZESTRIL) 10 MG tablet Take 5 mg by mouth daily.     loratadine (CLARITIN) 10 MG tablet Take 1 tablet (10 mg total) by mouth daily. 30 tablet 11   metFORMIN (GLUCOPHAGE) 1000 MG tablet TAKE 1 TABLET (1,000 MG TOTAL) BY MOUTH 2 (TWO) TIMES DAILY WITH A MEAL. 180 tablet 1   Multiple Vitamin (MULTIVITAMIN) capsule Take 1 capsule by mouth daily.     nitroGLYCERIN (NITROSTAT) 0.4 MG SL tablet Place 1 tablet (0.4 mg total) under the tongue every 5 (five) minutes as needed for chest pain. 25 tablet 5   Zinc 100 MG TABS Take 1 tablet by mouth daily.     No current facility-administered medications on file prior to visit.     PAST MEDICAL HISTORY: Past Medical History:  Diagnosis Date   Asthma    Back  pain    CAD (coronary artery disease)    Mild   Diabetes mellitus    Heart valve problem    HTN (hypertension)    Hyperlipidemia    Joint pain    OSA (obstructive sleep apnea) 08/09/2017   Palpitations    SOB (shortness of breath)    Subluxation    L2-3-4    PAST SURGICAL HISTORY: Past Surgical History:  Procedure Laterality Date   APPENDECTOMY     CARDIAC CATHETERIZATION     We recently performed a which revealed minor coronary artery irregularities. He had normal left ventricle systolic function with an EF of 60%   CARDIOVASCULAR STRESS TEST     EF 63%   TONSILLECTOMY     TUMOR REMOVAL     LEFT ARM    SOCIAL HISTORY: Social History   Tobacco Use   Smoking status: Former Smoker    Years: 7.00    Types: Pipe  Quit date: 62    Years since quitting: 40.7   Smokeless tobacco: Never Used  Substance Use Topics   Alcohol use: Yes    Comment: occassional   Drug use: No    FAMILY HISTORY: Family History  Problem Relation Age of Onset   Pneumonia Mother    Osteoporosis Mother    Arthritis Mother    Diabetes Mother    Hypertension Mother    Cancer Father        leukemia, lung   Cancer Brother    Diabetes Maternal Aunt    Hypertension Maternal Aunt    Hypertension Maternal Aunt    Lung cancer Unknown     ROS: Review of Systems  Constitutional: Positive for malaise/fatigue and weight loss.  Cardiovascular: Negative for chest pain and claudication.  Musculoskeletal: Negative for myalgias.    PHYSICAL EXAM: Blood pressure (!) 103/59, pulse 65, temperature 98 F (36.7 C), temperature source Oral, height 5\' 9"  (1.753 m), weight 241 lb (109.3 kg), SpO2 98 %. Body mass index is 35.59 kg/m. Physical Exam Vitals signs reviewed.  Constitutional:      Appearance: Normal appearance. He is obese.  Cardiovascular:     Rate and Rhythm: Normal rate.     Pulses: Normal pulses.  Pulmonary:     Effort: Pulmonary effort is normal.      Breath sounds: Normal breath sounds.  Musculoskeletal: Normal range of motion.  Skin:    General: Skin is warm and dry.  Neurological:     Mental Status: He is alert and oriented to person, place, and time.  Psychiatric:        Mood and Affect: Mood normal.        Behavior: Behavior normal.     RECENT LABS AND TESTS: BMET    Component Value Date/Time   NA 139 10/04/2018 0910   K 4.2 10/04/2018 0910   CL 102 10/04/2018 0910   CO2 20 10/04/2018 0910   GLUCOSE 129 (H) 10/04/2018 0910   GLUCOSE 117 (H) 11/13/2017 0938   BUN 18 10/04/2018 0910   CREATININE 0.82 10/04/2018 0910   CALCIUM 9.7 10/04/2018 0910   GFRNONAA 91 10/04/2018 0910   GFRAA 105 10/04/2018 0910   Lab Results  Component Value Date   HGBA1C 6.5 (H) 10/04/2018   HGBA1C 6.4 (H) 05/07/2018   HGBA1C 6.9 (H) 11/13/2017   HGBA1C 6.9 (H) 08/01/2017   HGBA1C 7.5 (H) 05/08/2017   Lab Results  Component Value Date   INSULIN 2.5 (L) 10/04/2018   INSULIN 4.3 05/07/2018   INSULIN 7.9 01/24/2018   CBC    Component Value Date/Time   WBC 7.3 11/20/2018 0958   RBC 4.49 11/20/2018 0958   HGB 13.2 11/20/2018 0958   HGB 13.0 01/24/2018 1307   HCT 39.0 11/20/2018 0958   HCT 38.1 01/24/2018 1307   PLT 236.0 11/20/2018 0958   PLT 265 01/24/2018 1307   MCV 87.0 11/20/2018 0958   MCV 86 01/24/2018 1307   MCH 29.2 01/24/2018 1307   MCHC 33.9 11/20/2018 0958   RDW 13.5 11/20/2018 0958   RDW 12.8 01/24/2018 1307   LYMPHSABS 1.3 11/20/2018 0958   LYMPHSABS 1.2 01/24/2018 1307   MONOABS 0.5 11/20/2018 0958   EOSABS 0.3 11/20/2018 0958   EOSABS 0.4 01/24/2018 1307   BASOSABS 0.1 11/20/2018 0958   BASOSABS 0.1 01/24/2018 1307   Iron/TIBC/Ferritin/ %Sat    Component Value Date/Time   IRON 95 11/20/2018 0958   FERRITIN 74.9 11/20/2018 0958  IRONPCTSAT 23.0 11/20/2018 0958   Lipid Panel     Component Value Date/Time   CHOL 221 (H) 10/04/2018 0910   TRIG 92 10/04/2018 0910   HDL 60 10/04/2018 0910   CHOLHDL  3.9 01/24/2018 1307   CHOLHDL 2 11/13/2017 0938   VLDL 11.4 11/13/2017 0938   LDLCALC 143 (H) 10/04/2018 0910   LDLDIRECT 198.6 03/20/2009 1127   Hepatic Function Panel     Component Value Date/Time   PROT 6.6 10/04/2018 0910   ALBUMIN 4.6 10/04/2018 0910   AST 14 10/04/2018 0910   ALT 10 10/04/2018 0910   ALKPHOS 38 (L) 10/04/2018 0910   BILITOT 0.4 10/04/2018 0910   BILIDIR 0.1 08/14/2014 0824      Component Value Date/Time   TSH 1.050 01/24/2018 1307   TSH 1.28 11/07/2016 1008   TSH 0.98 09/27/2011 0945      OBESITY BEHAVIORAL INTERVENTION VISIT  Today's visit was # 19   Starting weight: 301 lbs Starting date: 01/24/18 Today's weight : 241 lbs Today's date: 12/03/2018 Total lbs lost to date: 4660    ASK: We discussed the diagnosis of obesity with [name] Thull today and Zachary SeniorStephen agreed to give us permission to discuss obesity behavioral modification therapy today.  ASSESS: Zachary SeniorStephen has the diagnosis of obesity and his BMI today is 35.57 Zachary SeniorStephen is in the action stage of change   ADVISE: Zachary SeniorStephen was educated on the multiple health risks of obesity as well as the benefit of weight loss to improve his health. He was advised of the need for long term treatment and the importance of lifestyle modifications to improve his current health and to decrease his risk of future health problems.  AGREE: Multiple dietary modification options and treatment options were discussed and  Zachary SeniorStephen agreed to follow the recommendations documented in the above note.  ARRANGE: Zachary SeniorStephen was educated on the importance of frequent visits to treat obesity as outlined per CMS and USPSTF guidelines and agreed to schedule his next follow up appointment today.  I, Burt KnackSharon Martin, am acting as transcriptionist for Debbra RidingAlexandria Kadolph, MD  I have reviewed the above documentation for accuracy and completeness, and I agree with the above. - Debbra RidingAlexandria Kadolph, MD

## 2018-12-18 ENCOUNTER — Ambulatory Visit (INDEPENDENT_AMBULATORY_CARE_PROVIDER_SITE_OTHER): Payer: Medicare Other | Admitting: Family Medicine

## 2018-12-18 ENCOUNTER — Other Ambulatory Visit: Payer: Self-pay

## 2018-12-18 ENCOUNTER — Encounter (INDEPENDENT_AMBULATORY_CARE_PROVIDER_SITE_OTHER): Payer: Self-pay | Admitting: Family Medicine

## 2018-12-18 VITALS — BP 110/65 | HR 53 | Temp 98.1°F | Ht 69.0 in | Wt 242.0 lb

## 2018-12-18 DIAGNOSIS — E119 Type 2 diabetes mellitus without complications: Secondary | ICD-10-CM | POA: Diagnosis not present

## 2018-12-18 DIAGNOSIS — Z6835 Body mass index (BMI) 35.0-35.9, adult: Secondary | ICD-10-CM

## 2018-12-18 DIAGNOSIS — E7849 Other hyperlipidemia: Secondary | ICD-10-CM

## 2018-12-18 MED ORDER — FENOFIBRATE 160 MG PO TABS: 160.0000 mg | ORAL_TABLET | Freq: Every day | ORAL | 0 refills | Status: DC

## 2018-12-18 MED ORDER — SIMVASTATIN 40 MG PO TABS: ORAL_TABLET | ORAL | 0 refills | Status: DC

## 2018-12-20 NOTE — Progress Notes (Signed)
Office: [phone]  /  Fax: [phone]   HPI:   Chief Complaint: OBESITY Zachary Michael is here to discuss his progress with his obesity treatment plan. He is on the keep a food journal with 1550 calories and 78 grams of protein daily and the Category 3 plan and is following his eating plan approximately 90 to 95 % of the time. He states he is walking 45 to 90 minutes 7 times per week. Zachary Michael meets or exceeds his protein goals most days. He does not always eat enough calories. He does not eat after 6:00 PM due to high blood sugars if he eats later. His weight is 242 lb (109.8 kg) today and has had a weight gain of 1 pound over a period of 2 weeks since his last visit. He has lost 59 lbs since starting treatment with [name].  Diabetes II Zachary Michael has a diagnosis of diabetes type II. Zachary Michael states fasting BGs range between 128 and 178. He is well controlled on metformin. Zachary Michael denies hypoglycemia or polyphagia. Last A1c was at 6.5 on 10/04/18 He has been working on intensive lifestyle modifications including diet, exercise, and weight loss to help control his blood glucose levels.  Hyperlipidemia Zachary Michael has hyperlipidemia and has been trying to improve his cholesterol levels with intensive lifestyle modification including a low saturated fat diet, exercise and weight loss. He is on a stain and fenofibrate.  He denies any chest pain or shortness of breath. The 10-year ASCVD risk score Zachary Michael., et al., 2013) is: 26.6%   Values used to calculate the score:     Age: 68 years     Sex: Male     Is Non-Hispanic African American: No     Diabetic: Yes     Tobacco smoker: No     Systolic Blood Pressure: 110 mmHg     Is BP treated: Yes     HDL Cholesterol: 60 mg/dL     Total Cholesterol: 221 mg/dL  ASSESSMENT AND PLAN:  Type 2 diabetes mellitus without complication, without long-term current use of insulin (HCC)  Other hyperlipidemia - Plan: simvastatin (ZOCOR) 40 MG tablet, fenofibrate 160 MG  tablet, DISCONTINUED: fenofibrate 160 MG tablet, DISCONTINUED: simvastatin (ZOCOR) 40 MG tablet  Class 2 severe obesity with serious comorbidity and body mass index (BMI) of 35.0 to 35.9 in adult, unspecified obesity type (HCC)  PLAN:  Diabetes II Zachary Michael has been given extensive diabetes education by myself today including ideal fasting and post-prandial blood glucose readings, individual ideal Hgb A1c goals and hypoglycemia prevention. We discussed the importance of good blood sugar control to decrease the likelihood of diabetic complications such as nephropathy, neuropathy, limb loss, blindness, coronary artery disease, and death. We discussed the importance of intensive lifestyle modification including diet, exercise and weight loss as the first line treatment for diabetes. Zachary Michael will continue metformin 1,000 mg two times daily and follow up at the agreed upon time.  Hyperlipidemia Zachary Michael was informed of the American Heart Association Guidelines emphasizing intensive lifestyle modifications as the first line treatment for hyperlipidemia. We discussed many lifestyle modifications today in depth, and Zachary Michael will continue to work on decreasing saturated fats such as fatty red meat, butter and many fried foods. He will also increase vegetables and lean protein in his diet and continue to work on exercise and weight loss efforts. Zachary Michael agrees to continue fenofibrate 160 mg daily #30 with no refills and simvastatin 40 mg daily at bedtime #30 with no refills and follow up  as directed.  Obesity Zachary Michael is currently in the action stage of change. As such, his goal is to continue with weight loss efforts He has agreed to keep a food journal with 1450-1550 calories and 90+ grams of protein daily and follow the Category 3 plan Zachary Michael will continue walking 45 to 90 minutes, 7 times per week for weight loss and overall health benefits. We discussed the following Behavioral Modification Strategies  today: planning for success and increasing lean protein intake  [name] has agreed to follow up with our clinic in 2 to 3 weeks. He was informed of the importance of frequent follow up visits to maximize his success with intensive lifestyle modifications for his multiple health conditions.  ALLERGIES: No Known Allergies  MEDICATIONS: Current Outpatient Medications on File Prior to Visit  Medication Sig Dispense Refill  . albuterol (PROAIR HFA) 108 (90 BASE) MCG/ACT inhaler Inhale 2 puffs into the lungs every 6 (six) hours as needed for wheezing or shortness of breath. 1 Inhaler 4  . aspirin 81 MG tablet Take 81 mg by mouth daily.      [name] b complex vitamins tablet Take 1 tablet by mouth daily.    . calcium carbonate (CALCIUM 600) 600 MG TABS tablet Take 600 mg by mouth daily with breakfast.    . carvedilol (COREG) 6.25 MG tablet Take 1 tablet (6.25 mg total) by mouth 2 (two) times daily with a meal. 60 tablet 0  . Cinnamon 500 MG capsule Take 500 mg by mouth daily.    [name] co-enzyme Q-10 30 MG capsule Take 30 mg by mouth 3 (three) times daily.    . ferrous sulfate 325 (65 FE) MG EC tablet Take 325 mg by mouth 3 (three) times daily with meals.    . fluticasone (FLONASE) 50 MCG/ACT nasal spray Place 2 sprays into both nostrils daily. 16 g 6  . glucose blood (ONETOUCH VERIO) test strip Use as instructed 100 each 0  . lisinopril (ZESTRIL) 10 MG tablet Take 5 mg by mouth daily.    [name] loratadine (CLARITIN) 10 MG tablet Take 1 tablet (10 mg total) by mouth daily. 30 tablet 11  . metFORMIN (GLUCOPHAGE) 1000 MG tablet TAKE 1 TABLET (1,000 MG TOTAL) BY MOUTH 2 (TWO) TIMES DAILY WITH A MEAL. 180 tablet 1  . Multiple Vitamin (MULTIVITAMIN) capsule Take 1 capsule by mouth daily.    . nitroGLYCERIN (NITROSTAT) 0.4 MG SL tablet Place 1 tablet (0.4 mg total) under the tongue every 5 (five) minutes as needed for chest pain. 25 tablet 5  . Zinc 100 MG TABS Take 1 tablet by mouth daily.     No current  facility-administered medications on file prior to visit.     PAST MEDICAL HISTORY: Past Medical History:  Diagnosis Date  . Asthma   . Back pain   . CAD (coronary artery disease)    Mild  . Diabetes mellitus   . Heart valve problem   . HTN (hypertension)   . Hyperlipidemia   . Joint pain   . OSA (obstructive sleep apnea) 08/09/2017  . Palpitations   . SOB (shortness of breath)   . Subluxation    L2-3-4    PAST SURGICAL HISTORY: Past Surgical History:  Procedure Laterality Date  . APPENDECTOMY    . CARDIAC CATHETERIZATION     We recently performed a which revealed minor coronary artery irregularities. He had normal left ventricle systolic function with an EF of 60%  . CARDIOVASCULAR STRESS TEST  EF 63%  . TONSILLECTOMY    . TUMOR REMOVAL     LEFT ARM    SOCIAL HISTORY: Social History   Tobacco Use  . Smoking status: Former Smoker    Years: 7.00    Types: Pipe    Quit date: 1980    Years since quitting: 40.8  . Smokeless tobacco: Never Used  Substance Use Topics  . Alcohol use: Yes    Comment: occassional  . Drug use: No    FAMILY HISTORY: Family History  Problem Relation Age of Onset  . Pneumonia Mother   . Osteoporosis Mother   . Arthritis Mother   . Diabetes Mother   . Hypertension Mother   . Cancer Father        leukemia, lung  . Cancer Brother   . Diabetes Maternal Aunt   . Hypertension Maternal Aunt   . Hypertension Maternal Aunt   . Lung cancer Unknown     ROS: Review of Systems  Constitutional: Negative for weight loss.  Respiratory: Negative for shortness of breath.   Cardiovascular: Negative for chest pain.  Endo/Heme/Allergies:       Negative for polyphagia Negative for hypoglycemia    PHYSICAL EXAM: Blood pressure 110/65, pulse (!) 53, temperature 98.1 F (36.7 C), temperature source Oral, height 5\' 9"  (1.753 m), weight 242 lb (109.8 kg), SpO2 98 %. Body mass index is 35.74 kg/m. Physical Exam Vitals signs reviewed.   Constitutional:      Appearance: Normal appearance. He is well-developed. He is obese.  Cardiovascular:     Rate and Rhythm: Normal rate.  Pulmonary:     Effort: Pulmonary effort is normal.  Musculoskeletal: Normal range of motion.  Skin:    General: Skin is warm and dry.  Neurological:     Mental Status: He is alert and oriented to person, place, and time.  Psychiatric:        Mood and Affect: Mood normal.        Behavior: Behavior normal.     RECENT LABS AND TESTS: BMET    Component Value Date/Time   NA 139 10/04/2018 0910   K 4.2 10/04/2018 0910   CL 102 10/04/2018 0910   CO2 20 10/04/2018 0910   GLUCOSE 129 (H) 10/04/2018 0910   GLUCOSE 117 (H) 11/13/2017 0938   BUN 18 10/04/2018 0910   CREATININE 0.82 10/04/2018 0910   CALCIUM 9.7 10/04/2018 0910   GFRNONAA 91 10/04/2018 0910   GFRAA 105 10/04/2018 0910   Lab Results  Component Value Date   HGBA1C 6.5 (H) 10/04/2018   HGBA1C 6.4 (H) 05/07/2018   HGBA1C 6.9 (H) 11/13/2017   HGBA1C 6.9 (H) 08/01/2017   HGBA1C 7.5 (H) 05/08/2017   Lab Results  Component Value Date   INSULIN 2.5 (L) 10/04/2018   INSULIN 4.3 05/07/2018   INSULIN 7.9 01/24/2018   CBC    Component Value Date/Time   WBC 7.3 11/20/2018 0958   RBC 4.49 11/20/2018 0958   HGB 13.2 11/20/2018 0958   HGB 13.0 01/24/2018 1307   HCT 39.0 11/20/2018 0958   HCT 38.1 01/24/2018 1307   PLT 236.0 11/20/2018 0958   PLT 265 01/24/2018 1307   MCV 87.0 11/20/2018 0958   MCV 86 01/24/2018 1307   MCH 29.2 01/24/2018 1307   MCHC 33.9 11/20/2018 0958   RDW 13.5 11/20/2018 0958   RDW 12.8 01/24/2018 1307   LYMPHSABS 1.3 11/20/2018 0958   LYMPHSABS 1.2 01/24/2018 1307   MONOABS 0.5 11/20/2018 7096  EOSABS 0.3 11/20/2018 0958   EOSABS 0.4 01/24/2018 1307   BASOSABS 0.1 11/20/2018 0958   BASOSABS 0.1 01/24/2018 1307   Iron/TIBC/Ferritin/ %Sat    Component Value Date/Time   IRON 95 11/20/2018 0958   FERRITIN 74.9 11/20/2018 0958   IRONPCTSAT 23.0  11/20/2018 0958   Lipid Panel     Component Value Date/Time   CHOL 221 (H) 10/04/2018 0910   TRIG 92 10/04/2018 0910   HDL 60 10/04/2018 0910   CHOLHDL 3.9 01/24/2018 1307   CHOLHDL 2 11/13/2017 0938   VLDL 11.4 11/13/2017 0938   LDLCALC 143 (H) 10/04/2018 0910   LDLDIRECT 198.6 03/20/2009 1127   Hepatic Function Panel     Component Value Date/Time   PROT 6.6 10/04/2018 0910   ALBUMIN 4.6 10/04/2018 0910   AST 14 10/04/2018 0910   ALT 10 10/04/2018 0910   ALKPHOS 38 (L) 10/04/2018 0910   BILITOT 0.4 10/04/2018 0910   BILIDIR 0.1 08/14/2014 0824      Component Value Date/Time   TSH 1.050 01/24/2018 1307   TSH 1.28 11/07/2016 1008   TSH 0.98 09/27/2011 0945     Ref. Range 05/07/2018 13:21  Vitamin D, 25-Hydroxy Latest Ref Range: 30.0 - 100.0 ng/mL 53.9    OBESITY BEHAVIORAL INTERVENTION VISIT  Today's visit was # 20   Starting weight: 301 Starting date: 01/24/18 Today's weight : 241 Today's date: 12/18/2018 Total lbs lost to date: 62    12/18/2018  Height 5\' 9"  (1.753 m)  Weight 242 lb (109.8 kg)  BMI (Calculated) 35.72  BLOOD PRESSURE - SYSTOLIC 110  BLOOD PRESSURE - DIASTOLIC 65   Body Fat % 38.8 %  Total Body Water (lbs) 123.2 lbs    ASK: We discussed the diagnosis of obesity with Zachary Michael today and Zachary Michael agreed to give us permission to discuss obesity behavioral modification therapy today.  ASSESS: Zachary Michael has the diagnosis of obesity and his BMI today is 35.72 Zachary Michael is in the action stage of change   ADVISE: Zachary Michael was educated on the multiple health risks of obesity as well as the benefit of weight loss to improve his health. He was advised of the need for long term treatment and the importance of lifestyle modifications to improve his current health and to decrease his risk of future health problems.  AGREE: Multiple dietary modification options and treatment options were discussed and  Zachary Michael agreed to follow the recommendations  documented in the above note.  ARRANGE: Zachary Michael was educated on the importance of frequent visits to treat obesity as outlined per CMS and USPSTF guidelines and agreed to schedule his next follow up appointment today.  Cristi LoronI, Joanne Murray, am acting as Energy managertranscriptionist for AshlandDawn Kittie Krizan, FNP-C.  I have reviewed the above documentation for accuracy and completeness, and I agree with the above.  - Araceli Coufal, FNP-C.

## 2018-12-24 ENCOUNTER — Encounter (INDEPENDENT_AMBULATORY_CARE_PROVIDER_SITE_OTHER): Payer: Self-pay | Admitting: Family Medicine

## 2019-01-08 ENCOUNTER — Ambulatory Visit (INDEPENDENT_AMBULATORY_CARE_PROVIDER_SITE_OTHER): Payer: Medicare Other | Admitting: Family Medicine

## 2019-01-08 ENCOUNTER — Encounter (INDEPENDENT_AMBULATORY_CARE_PROVIDER_SITE_OTHER): Payer: Self-pay | Admitting: Family Medicine

## 2019-01-08 ENCOUNTER — Other Ambulatory Visit: Payer: Self-pay

## 2019-01-08 VITALS — BP 107/58 | HR 58 | Temp 98.4°F | Ht 69.0 in | Wt 240.0 lb

## 2019-01-08 DIAGNOSIS — Z6835 Body mass index (BMI) 35.0-35.9, adult: Secondary | ICD-10-CM | POA: Diagnosis not present

## 2019-01-08 DIAGNOSIS — E119 Type 2 diabetes mellitus without complications: Secondary | ICD-10-CM

## 2019-01-09 NOTE — Progress Notes (Signed)
Office: [phone]  /  Fax: [phone]   HPI:   Chief Complaint: OBESITY Zachary Michael is here to discuss his progress with his obesity treatment plan. He is on the keep a food journal with 1450-1550 calories and 90+ grams of protein daily or follow the Category 3 plan and is following his eating plan approximately 75-80 % of the time. He states he is walking for 23-25 minutes 7 times per week. Zachary Michael struggles to get in all of the calories but does generally does meet his protein goals. He tends to get busy toward the end of the day and is short on his calories. He doesn't like to eat after 6 pm because his morning blood sugars are high.  His weight is 240 lb (108.9 kg) today and has had a weight loss of 2 pounds over a period of 3 weeks since his last visit. He has lost 61 lbs since starting treatment with [name].  Diabetes II Zachary Michael has a diagnosis of diabetes type II. Zachary Michael's diabetes is well controlled on metformin. Last A1c was 6.5 on 10/04/2018. He reports high AM blood sugars despite not eating after 6 pm. He denies hypoglycemia. He has been working on intensive lifestyle modifications including diet, exercise, and weight loss to help control his blood glucose levels.  ASSESSMENT AND PLAN:  Type 2 diabetes mellitus with hyperglycemia, without long-term current use of insulin (HCC)  Class 2 severe obesity with serious comorbidity and body mass index (BMI) of 35.0 to 35.9 in adult, unspecified obesity type (HCC)  PLAN:  Diabetes II  He is to add PM protein snacks. Seab agrees to continue taking metformin. I explained hepatic gluconeogenesis. Zachary Michael agrees to follow up with our clinic in 3 weeks.  Obesity Zachary Michael is currently in the action stage of change. As such, his goal is to continue with weight loss efforts He has agreed to keep a food journal with 1400-1500 calories and 90-100 grams of protein daily Zachary Michael is to add evening snack with protein and increase calories to a  minimum of 1400 a day. Zachary Michael has been instructed to work up to a goal of 150 minutes of combined cardio and strengthening exercise per week or add resistance training 2 to 3 times per week for weight loss and overall health benefits. We discussed the following Behavioral Modification Strategies today: increasing lean protein intake, better snacking choices, planning for success, and keep a strict food journal   Zachary Michael has agreed to follow up with our clinic in 3 weeks. He was informed of the importance of frequent follow up visits to maximize his success with intensive lifestyle modifications for his multiple health conditions.  ALLERGIES: No Known Allergies  MEDICATIONS: Current Outpatient Medications on File Prior to Visit  Medication Sig Dispense Refill  . albuterol (PROAIR HFA) 108 (90 BASE) MCG/ACT inhaler Inhale 2 puffs into the lungs every 6 (six) hours as needed for wheezing or shortness of breath. 1 Inhaler 4  . aspirin 81 MG tablet Take 81 mg by mouth daily.      [name] b complex vitamins tablet Take 1 tablet by mouth daily.    . calcium carbonate (CALCIUM 600) 600 MG TABS tablet Take 600 mg by mouth daily with breakfast.    . carvedilol (COREG) 6.25 MG tablet Take 1 tablet (6.25 mg total) by mouth 2 (two) times daily with a meal. 60 tablet 0  . Cinnamon 500 MG capsule Take 500 mg by mouth daily.    [name] co-enzyme Q-10 30  MG capsule Take 30 mg by mouth 3 (three) times daily.    . fenofibrate 160 MG tablet Take 1 tablet (160 mg total) by mouth daily. 30 tablet 0  . ferrous sulfate 325 (65 FE) MG EC tablet Take 325 mg by mouth 3 (three) times daily with meals.    . fluticasone (FLONASE) 50 MCG/ACT nasal spray Place 2 sprays into both nostrils daily. 16 g 6  . glucose blood (ONETOUCH VERIO) test strip Use as instructed 100 each 0  . lisinopril (ZESTRIL) 10 MG tablet Take 5 mg by mouth daily.    [name] loratadine (CLARITIN) 10 MG tablet Take 1 tablet (10 mg total) by mouth daily. 30 tablet 11   . metFORMIN (GLUCOPHAGE) 1000 MG tablet TAKE 1 TABLET (1,000 MG TOTAL) BY MOUTH 2 (TWO) TIMES DAILY WITH A MEAL. 180 tablet 1  . Multiple Vitamin (MULTIVITAMIN) capsule Take 1 capsule by mouth daily.    . nitroGLYCERIN (NITROSTAT) 0.4 MG SL tablet Place 1 tablet (0.4 mg total) under the tongue every 5 (five) minutes as needed for chest pain. 25 tablet 5  . simvastatin (ZOCOR) 40 MG tablet TAKE 1 TABLET BY MOUTH EVERYDAY AT BEDTIME 30 tablet 0  . Zinc 100 MG TABS Take 1 tablet by mouth daily.     No current facility-administered medications on file prior to visit.     PAST MEDICAL HISTORY: Past Medical History:  Diagnosis Date  . Asthma   . Back pain   . CAD (coronary artery disease)    Mild  . Diabetes mellitus   . Heart valve problem   . HTN (hypertension)   . Hyperlipidemia   . Joint pain   . OSA (obstructive sleep apnea) 08/09/2017  . Palpitations   . SOB (shortness of breath)   . Subluxation    L2-3-4    PAST SURGICAL HISTORY: Past Surgical History:  Procedure Laterality Date  . APPENDECTOMY    . CARDIAC CATHETERIZATION     We recently performed a which revealed minor coronary artery irregularities. He had normal left ventricle systolic function with an EF of 60%  . CARDIOVASCULAR STRESS TEST     EF 63%  . TONSILLECTOMY    . TUMOR REMOVAL     LEFT ARM    SOCIAL HISTORY: Social History   Tobacco Use  . Smoking status: Former Smoker    Years: 7.00    Types: Pipe    Quit date: 1980    Years since quitting: 40.8  . Smokeless tobacco: Never Used  Substance Use Topics  . Alcohol use: Yes    Comment: occassional  . Drug use: No    FAMILY HISTORY: Family History  Problem Relation Age of Onset  . Pneumonia Mother   . Osteoporosis Mother   . Arthritis Mother   . Diabetes Mother   . Hypertension Mother   . Cancer Father        leukemia, lung  . Cancer Brother   . Diabetes Maternal Aunt   . Hypertension Maternal Aunt   . Hypertension Maternal Aunt   .  Lung cancer Unknown     ROS: Review of Systems  Constitutional: Positive for weight loss.  Endo/Heme/Allergies:       Negative hypoglycemia    PHYSICAL EXAM: Blood pressure (!) 107/58, pulse (!) 58, temperature 98.4 F (36.9 C), temperature source Oral, height 5\' 9"  (1.753 m), weight 240 lb (108.9 kg), SpO2 97 %. Body mass index is 35.44 kg/m. Physical Exam Vitals signs  reviewed.  Constitutional:      Appearance: Normal appearance. He is obese.  Cardiovascular:     Rate and Rhythm: Normal rate.     Pulses: Normal pulses.  Pulmonary:     Effort: Pulmonary effort is normal.     Breath sounds: Normal breath sounds.  Musculoskeletal: Normal range of motion.  Skin:    General: Skin is warm and dry.  Neurological:     Mental Status: He is alert and oriented to person, place, and time.  Psychiatric:        Mood and Affect: Mood normal.        Behavior: Behavior normal.     RECENT LABS AND TESTS: BMET    Component Value Date/Time   NA 139 10/04/2018 0910   K 4.2 10/04/2018 0910   CL 102 10/04/2018 0910   CO2 20 10/04/2018 0910   GLUCOSE 129 (H) 10/04/2018 0910   GLUCOSE 117 (H) 11/13/2017 0938   BUN 18 10/04/2018 0910   CREATININE 0.82 10/04/2018 0910   CALCIUM 9.7 10/04/2018 0910   GFRNONAA 91 10/04/2018 0910   GFRAA 105 10/04/2018 0910   Lab Results  Component Value Date   HGBA1C 6.5 (H) 10/04/2018   HGBA1C 6.4 (H) 05/07/2018   HGBA1C 6.9 (H) 11/13/2017   HGBA1C 6.9 (H) 08/01/2017   HGBA1C 7.5 (H) 05/08/2017   Lab Results  Component Value Date   INSULIN 2.5 (L) 10/04/2018   INSULIN 4.3 05/07/2018   INSULIN 7.9 01/24/2018   CBC    Component Value Date/Time   WBC 7.3 11/20/2018 0958   RBC 4.49 11/20/2018 0958   HGB 13.2 11/20/2018 0958   HGB 13.0 01/24/2018 1307   HCT 39.0 11/20/2018 0958   HCT 38.1 01/24/2018 1307   PLT 236.0 11/20/2018 0958   PLT 265 01/24/2018 1307   MCV 87.0 11/20/2018 0958   MCV 86 01/24/2018 1307   MCH 29.2 01/24/2018 1307    MCHC 33.9 11/20/2018 0958   RDW 13.5 11/20/2018 0958   RDW 12.8 01/24/2018 1307   LYMPHSABS 1.3 11/20/2018 0958   LYMPHSABS 1.2 01/24/2018 1307   MONOABS 0.5 11/20/2018 0958   EOSABS 0.3 11/20/2018 0958   EOSABS 0.4 01/24/2018 1307   BASOSABS 0.1 11/20/2018 0958   BASOSABS 0.1 01/24/2018 1307   Iron/TIBC/Ferritin/ %Sat    Component Value Date/Time   IRON 95 11/20/2018 0958   FERRITIN 74.9 11/20/2018 0958   IRONPCTSAT 23.0 11/20/2018 0958   Lipid Panel     Component Value Date/Time   CHOL 221 (H) 10/04/2018 0910   TRIG 92 10/04/2018 0910   HDL 60 10/04/2018 0910   CHOLHDL 3.9 01/24/2018 1307   CHOLHDL 2 11/13/2017 0938   VLDL 11.4 11/13/2017 0938   LDLCALC 143 (H) 10/04/2018 0910   LDLDIRECT 198.6 03/20/2009 1127   Hepatic Function Panel     Component Value Date/Time   PROT 6.6 10/04/2018 0910   ALBUMIN 4.6 10/04/2018 0910   AST 14 10/04/2018 0910   ALT 10 10/04/2018 0910   ALKPHOS 38 (L) 10/04/2018 0910   BILITOT 0.4 10/04/2018 0910   BILIDIR 0.1 08/14/2014 0824      Component Value Date/Time   TSH 1.050 01/24/2018 1307   TSH 1.28 11/07/2016 1008   TSH 0.98 09/27/2011 0945      OBESITY BEHAVIORAL INTERVENTION VISIT  Today's visit was # 21   Starting weight: 301 lbs Starting date: 01/24/18 Today's weight : 240 lbs Today's date: 01/08/2019 Total lbs lost to date: 52  At least 15 minutes were spent on discussing the following behavioral intervention visit.   ASK: We discussed the diagnosis of obesity with Zachary Michael today and Zachary Michael agreed to give us permission to discuss obesity behavioral modification therapy today.  ASSESS: Zachary Michael has the diagnosis of obesity and his BMI today is 35.43 Zachary Michael is in the action stage of change   ADVISE: Zachary Michael was educated on the multiple health risks of obesity as well as the benefit of weight loss to improve his health. He was advised of the need for long term treatment and the importance of lifestyle  modifications to improve his current health and to decrease his risk of future health problems.  AGREE: Multiple dietary modification options and treatment options were discussed and  Zachary Michael agreed to follow the recommendations documented in the above note.  ARRANGE: Zachary Michael was educated on the importance of frequent visits to treat obesity as outlined per CMS and USPSTF guidelines and agreed to schedule his next follow up appointment today.  Trude McburneyI, Sharon Martin, am acting as transcriptionist for AshlandDawn Mecca Guitron, FNP-C  I have reviewed the above documentation for accuracy and completeness, and I agree with the above.  - Levonte Molina, FNP-C.

## 2019-01-15 ENCOUNTER — Encounter (INDEPENDENT_AMBULATORY_CARE_PROVIDER_SITE_OTHER): Payer: Self-pay | Admitting: Family Medicine

## 2019-01-29 ENCOUNTER — Encounter (INDEPENDENT_AMBULATORY_CARE_PROVIDER_SITE_OTHER): Payer: Self-pay | Admitting: Family Medicine

## 2019-01-29 ENCOUNTER — Ambulatory Visit (INDEPENDENT_AMBULATORY_CARE_PROVIDER_SITE_OTHER): Payer: Medicare Other | Admitting: Family Medicine

## 2019-01-29 ENCOUNTER — Other Ambulatory Visit: Payer: Self-pay

## 2019-01-29 VITALS — BP 96/54 | HR 66 | Temp 97.9°F | Ht 69.0 in | Wt 235.0 lb

## 2019-01-29 DIAGNOSIS — E119 Type 2 diabetes mellitus without complications: Secondary | ICD-10-CM

## 2019-01-29 DIAGNOSIS — E669 Obesity, unspecified: Secondary | ICD-10-CM | POA: Diagnosis not present

## 2019-01-29 DIAGNOSIS — E7849 Other hyperlipidemia: Secondary | ICD-10-CM | POA: Diagnosis not present

## 2019-01-29 DIAGNOSIS — Z6834 Body mass index (BMI) 34.0-34.9, adult: Secondary | ICD-10-CM

## 2019-01-30 ENCOUNTER — Encounter (INDEPENDENT_AMBULATORY_CARE_PROVIDER_SITE_OTHER): Payer: Self-pay | Admitting: Family Medicine

## 2019-01-30 MED ORDER — SIMVASTATIN 40 MG PO TABS: ORAL_TABLET | ORAL | 0 refills | Status: DC

## 2019-01-30 NOTE — Progress Notes (Signed)
Office: [phone]  /  Fax: [phone]   HPI:   Chief Complaint: OBESITY Zachary Michael is here to discuss his progress with his obesity treatment plan. He is on the keep a food journal with 1400 to 1450 calories and 90 grams of protein daily and the Category 3 plan and is following his eating plan approximately 70 % of the time. He states he is walking 45 minutes 7 times per week. Zachary Michael is doing a better job with getting in more calories. He has been eating less than 1400 calories/day at times. He has eaten at least 1400 calories a day since his last office visit. He does meet his protein goals daily. His weight is 235 lb (106.6 kg) today and has had a weight loss of 5 pounds over a period of 3 weeks since his last visit. He has lost 66 lbs since starting treatment with [name].  Diabetes II Kol has a diagnosis of diabetes type II. Quinterious has not checked his blood sugars over the past few weeks. His diabetes is well controlled on metformin. He denies nausea, vomiting, diarrhea or hypoglycemia.  Lab Results  Component Value Date   HGBA1C 6.5 (H) 10/04/2018    Hyperlipidemia Vence has hyperlipidemia and his last LDL was elevated at 143 (10/04/18). Prior to that LDL has been 74 (05/07/18).  Newel has coronary artery disease. He reports he had run out of Simvastatin and had not taken for a while before that test was drawn. [name] He has been compliant with simvastatin recently. Harl has been trying to improve his cholesterol levels with intensive lifestyle modification including a low saturated fat diet, exercise and weight loss. He denies any chest pain, shortness of breath or myalgias.  The 10-year ASCVD risk score [name]., et al., 2013) is: 21.5%   Values used to calculate the score:     Age: 38 years     Sex: Male     Is Non-Hispanic African American: No     Diabetic: Yes     Tobacco smoker: No     Systolic Blood Pressure: 96 mmHg     Is BP treated: Yes     HDL Cholesterol: 60  mg/dL     Total Cholesterol: 221 mg/dL   ASSESSMENT AND PLAN:  Type 2 diabetes mellitus without complication, without long-term current use of insulin (HCC)  Other hyperlipidemia - Plan: simvastatin (ZOCOR) 40 MG tablet  Class 1 obesity with serious comorbidity and body mass index (BMI) of 34.0 to 34.9 in adult, unspecified obesity type  PLAN:  Diabetes II  Zachary Michael will continue metformin and he will follow up at the agreed upon time.  Hyperlipidemia Wilgus agrees to continue Simvastatin 40 mg daily #30 with no refills and continue fenofibrate. Jakin agrees to follow up with our clinic in 2 to 3 weeks.  Obesity Zachary Michael is currently in the action stage of change. As such, his goal is to continue with weight loss efforts He has agreed to keep a food journal with 1400 to 1500 calories and 90 to 100 grams of protein daily Zachary Michael will continue walking for 45 minutes, 7 times per week for weight loss and overall health benefits. We discussed the following Behavioral Modification Strategies today: planning for success and keep a strict food journal Lamichael will eat a minimum of 1400 calories daily with a maximum of 1500.  Zachary Michael has agreed to follow up with our clinic in 2 to 3 weeks. He was informed of the importance of  frequent follow up visits to maximize his success with intensive lifestyle modifications for his multiple health conditions.  ALLERGIES: No Known Allergies  MEDICATIONS: Current Outpatient Medications on File Prior to Visit  Medication Sig Dispense Refill  . albuterol (PROAIR HFA) 108 (90 BASE) MCG/ACT inhaler Inhale 2 puffs into the lungs every 6 (six) hours as needed for wheezing or shortness of breath. 1 Inhaler 4  . aspirin 81 MG tablet Take 81 mg by mouth daily.      [name]. b complex vitamins tablet Take 1 tablet by mouth daily.    . calcium carbonate (CALCIUM 600) 600 MG TABS tablet Take 600 mg by mouth daily with breakfast.    . carvedilol (COREG) 6.25 MG  tablet Take 1 tablet (6.25 mg total) by mouth 2 (two) times daily with a meal. 60 tablet 0  . cholecalciferol (VITAMIN D3) 25 MCG (1000 UT) tablet Take 5,000 Units by mouth daily.    . Cinnamon 500 MG capsule Take 500 mg by mouth daily.    [name]. co-enzyme Q-10 30 MG capsule Take 30 mg by mouth 3 (three) times daily.    . fenofibrate 160 MG tablet Take 1 tablet (160 mg total) by mouth daily. 30 tablet 0  . ferrous sulfate 325 (65 FE) MG EC tablet Take 325 mg by mouth 3 (three) times daily with meals.    . fluticasone (FLONASE) 50 MCG/ACT nasal spray Place 2 sprays into both nostrils daily. 16 g 6  . glucose blood (ONETOUCH VERIO) test strip Use as instructed 100 each 0  . lisinopril (ZESTRIL) 10 MG tablet Take 5 mg by mouth daily.    [name]. loratadine (CLARITIN) 10 MG tablet Take 1 tablet (10 mg total) by mouth daily. 30 tablet 11  . metFORMIN (GLUCOPHAGE) 1000 MG tablet TAKE 1 TABLET (1,000 MG TOTAL) BY MOUTH 2 (TWO) TIMES DAILY WITH A MEAL. 180 tablet 1  . Multiple Vitamin (MULTIVITAMIN) capsule Take 1 capsule by mouth daily.    . nitroGLYCERIN (NITROSTAT) 0.4 MG SL tablet Place 1 tablet (0.4 mg total) under the tongue every 5 (five) minutes as needed for chest pain. 25 tablet 5  . simvastatin (ZOCOR) 40 MG tablet TAKE 1 TABLET BY MOUTH EVERYDAY AT BEDTIME 30 tablet 0  . Zinc 100 MG TABS Take 1 tablet by mouth daily.     No current facility-administered medications on file prior to visit.     PAST MEDICAL HISTORY: Past Medical History:  Diagnosis Date  . Asthma   . Back pain   . CAD (coronary artery disease)    Mild  . Diabetes mellitus   . Heart valve problem   . HTN (hypertension)   . Hyperlipidemia   . Joint pain   . OSA (obstructive sleep apnea) 08/09/2017  . Palpitations   . SOB (shortness of breath)   . Subluxation    L2-3-4    PAST SURGICAL HISTORY: Past Surgical History:  Procedure Laterality Date  . APPENDECTOMY    . CARDIAC CATHETERIZATION     We recently performed a which  revealed minor coronary artery irregularities. He had normal left ventricle systolic function with an EF of 60%  . CARDIOVASCULAR STRESS TEST     EF 63%  . TONSILLECTOMY    . TUMOR REMOVAL     LEFT ARM    SOCIAL HISTORY: Social History   Tobacco Use  . Smoking status: Former Smoker    Years: 7.00    Types: Pipe    Quit  date: 53    Years since quitting: 40.9  . Smokeless tobacco: Never Used  Substance Use Topics  . Alcohol use: Yes    Comment: occassional  . Drug use: No    FAMILY HISTORY: Family History  Problem Relation Age of Onset  . Pneumonia Mother   . Osteoporosis Mother   . Arthritis Mother   . Diabetes Mother   . Hypertension Mother   . Cancer Father        leukemia, lung  . Cancer Brother   . Diabetes Maternal Aunt   . Hypertension Maternal Aunt   . Hypertension Maternal Aunt   . Lung cancer Unknown     ROS: Review of Systems  Constitutional: Positive for weight loss.  Respiratory: Negative for shortness of breath.   Cardiovascular: Negative for chest pain.  Gastrointestinal: Negative for diarrhea, nausea and vomiting.  Musculoskeletal: Negative for myalgias.  Endo/Heme/Allergies:       Negative for hypoglycemia    PHYSICAL EXAM: Blood pressure (!) 96/54, pulse 66, temperature 97.9 F (36.6 C), temperature source Oral, height 5\' 9"  (1.753 m), weight 235 lb (106.6 kg), SpO2 97 %. Body mass index is 34.7 kg/m. Physical Exam Vitals signs reviewed.  Constitutional:      Appearance: Normal appearance. He is well-developed. He is obese.  Cardiovascular:     Rate and Rhythm: Normal rate.  Pulmonary:     Effort: Pulmonary effort is normal.  Musculoskeletal: Normal range of motion.  Skin:    General: Skin is warm and dry.  Neurological:     Mental Status: He is alert and oriented to person, place, and time.  Psychiatric:        Mood and Affect: Mood normal.        Behavior: Behavior normal.     RECENT LABS AND TESTS: BMET    Component  Value Date/Time   NA 139 10/04/2018 0910   K 4.2 10/04/2018 0910   CL 102 10/04/2018 0910   CO2 20 10/04/2018 0910   GLUCOSE 129 (H) 10/04/2018 0910   GLUCOSE 117 (H) 11/13/2017 0938   BUN 18 10/04/2018 0910   CREATININE 0.82 10/04/2018 0910   CALCIUM 9.7 10/04/2018 0910   GFRNONAA 91 10/04/2018 0910   GFRAA 105 10/04/2018 0910   Lab Results  Component Value Date   HGBA1C 6.5 (H) 10/04/2018   HGBA1C 6.4 (H) 05/07/2018   HGBA1C 6.9 (H) 11/13/2017   HGBA1C 6.9 (H) 08/01/2017   HGBA1C 7.5 (H) 05/08/2017   Lab Results  Component Value Date   INSULIN 2.5 (L) 10/04/2018   INSULIN 4.3 05/07/2018   INSULIN 7.9 01/24/2018   CBC    Component Value Date/Time   WBC 7.3 11/20/2018 0958   RBC 4.49 11/20/2018 0958   HGB 13.2 11/20/2018 0958   HGB 13.0 01/24/2018 1307   HCT 39.0 11/20/2018 0958   HCT 38.1 01/24/2018 1307   PLT 236.0 11/20/2018 0958   PLT 265 01/24/2018 1307   MCV 87.0 11/20/2018 0958   MCV 86 01/24/2018 1307   MCH 29.2 01/24/2018 1307   MCHC 33.9 11/20/2018 0958   RDW 13.5 11/20/2018 0958   RDW 12.8 01/24/2018 1307   LYMPHSABS 1.3 11/20/2018 0958   LYMPHSABS 1.2 01/24/2018 1307   MONOABS 0.5 11/20/2018 0958   EOSABS 0.3 11/20/2018 0958   EOSABS 0.4 01/24/2018 1307   BASOSABS 0.1 11/20/2018 0958   BASOSABS 0.1 01/24/2018 1307   Iron/TIBC/Ferritin/ %Sat    Component Value Date/Time   IRON 95  11/20/2018 0958   FERRITIN 74.9 11/20/2018 0958   IRONPCTSAT 23.0 11/20/2018 0958   Lipid Panel     Component Value Date/Time   CHOL 221 (H) 10/04/2018 0910   TRIG 92 10/04/2018 0910   HDL 60 10/04/2018 0910   CHOLHDL 3.9 01/24/2018 1307   CHOLHDL 2 11/13/2017 0938   VLDL 11.4 11/13/2017 0938   LDLCALC 143 (H) 10/04/2018 0910   LDLDIRECT 198.6 03/20/2009 1127   Hepatic Function Panel     Component Value Date/Time   PROT 6.6 10/04/2018 0910   ALBUMIN 4.6 10/04/2018 0910   AST 14 10/04/2018 0910   ALT 10 10/04/2018 0910   ALKPHOS 38 (L) 10/04/2018 0910    BILITOT 0.4 10/04/2018 0910   BILIDIR 0.1 08/14/2014 0824      Component Value Date/Time   TSH 1.050 01/24/2018 1307   TSH 1.28 11/07/2016 1008   TSH 0.98 09/27/2011 0945     Ref. Range 05/07/2018 13:21  Vitamin D, 25-Hydroxy Latest Ref Range: 30.0 - 100.0 ng/mL 53.9    OBESITY BEHAVIORAL INTERVENTION VISIT  Today's visit was # 22   Starting weight: 301 lbs Starting date: 01/24/2018 Today's weight : 235 lbs  Today's date: 01/29/2019 Total lbs lost to date: 66    01/29/2019  Height 5\' 9"  (1.753 m)  Weight 235 lb (106.6 kg)  BMI (Calculated) 34.69  BLOOD PRESSURE - SYSTOLIC 96  BLOOD PRESSURE - DIASTOLIC 54   Body Fat % 16.0 %  Total Body Water (lbs) 118.4 lbs    ASK: We discussed the diagnosis of obesity with Riley Lam today and Jadarrius agreed to give [name] permission to discuss obesity behavioral modification therapy today.  ASSESS: Jex has the diagnosis of obesity and his BMI today is 34.69 Zachary Michael is in the action stage of change   ADVISE: Zachary Michael was educated on the multiple health risks of obesity as well as the benefit of weight loss to improve his health. He was advised of the need for long term treatment and the importance of lifestyle modifications to improve his current health and to decrease his risk of future health problems.  AGREE: Multiple dietary modification options and treatment options were discussed and  Walton agreed to follow the recommendations documented in the above note.  ARRANGE: Kelyn was educated on the importance of frequent visits to treat obesity as outlined per CMS and USPSTF guidelines and agreed to schedule his next follow up appointment today.  I, Doreene Nest, am acting as transcriptionist for [employer], FNP-C  I have reviewed the above documentation for accuracy and completeness, and I agree with the above.  - Sekou Zuckerman, FNP-C.

## 2019-02-02 ENCOUNTER — Other Ambulatory Visit (INDEPENDENT_AMBULATORY_CARE_PROVIDER_SITE_OTHER): Payer: Self-pay | Admitting: Family Medicine

## 2019-02-02 DIAGNOSIS — E7849 Other hyperlipidemia: Secondary | ICD-10-CM

## 2019-02-13 ENCOUNTER — Telehealth (INDEPENDENT_AMBULATORY_CARE_PROVIDER_SITE_OTHER): Payer: Medicare Other | Admitting: Family Medicine

## 2019-02-13 ENCOUNTER — Other Ambulatory Visit: Payer: Self-pay

## 2019-02-13 ENCOUNTER — Encounter (INDEPENDENT_AMBULATORY_CARE_PROVIDER_SITE_OTHER): Payer: Self-pay | Admitting: Family Medicine

## 2019-02-13 DIAGNOSIS — E669 Obesity, unspecified: Secondary | ICD-10-CM | POA: Diagnosis not present

## 2019-02-13 DIAGNOSIS — E7849 Other hyperlipidemia: Secondary | ICD-10-CM

## 2019-02-13 DIAGNOSIS — I10 Essential (primary) hypertension: Secondary | ICD-10-CM | POA: Diagnosis not present

## 2019-02-13 DIAGNOSIS — I2510 Atherosclerotic heart disease of native coronary artery without angina pectoris: Secondary | ICD-10-CM

## 2019-02-13 DIAGNOSIS — Z6834 Body mass index (BMI) 34.0-34.9, adult: Secondary | ICD-10-CM

## 2019-02-13 DIAGNOSIS — E119 Type 2 diabetes mellitus without complications: Secondary | ICD-10-CM

## 2019-02-13 DIAGNOSIS — E559 Vitamin D deficiency, unspecified: Secondary | ICD-10-CM | POA: Diagnosis not present

## 2019-02-13 MED ORDER — FENOFIBRATE 160 MG PO TABS: 160.0000 mg | ORAL_TABLET | Freq: Every day | ORAL | 0 refills | Status: DC

## 2019-02-13 NOTE — Patient Instructions (Signed)
GETTING TO GOOD BOWEL HEALTH  Irregular bowel habits such as constipation can lead to many problems over time.  Having one soft bowel movement a day is the most important way to prevent further problems.    The goal: ONE SOFT BOWEL MOVEMENT A DAY!  To have soft, regular bowel movements:  . Drink at least 8 tall glasses of water a day.   . Take plenty of fiber.  Fiber is the undigested part of plant food that passes into the colon, acting s "natures broom" to encourage bowel motility and movement.  Fiber can absorb and hold large amounts of water. This results in a larger, bulkier stool, which is soft and easier to pass. Work gradually over several weeks up to 6 servings a day of fiber (25g a day even more if needed) in the form of: o Vegetables -- Root (potatoes, carrots, turnips), leafy green (lettuce, salad greens, celery, spinach), or cooked high residue (cabbage, broccoli, etc) o Fruit -- Fresh (unpeeled skin & pulp), Dried (prunes, apricots, cherries, etc ),  or stewed ( applesauce)  o Whole grain breads, pasta, etc (whole wheat)  o Bran cereals  . Bulking Agents -- This type of water-retaining fiber generally is easily obtained each day by one of the following:  o Psyllium bran -- The psyllium plant is remarkable because its ground seeds can retain so much water. This product is available as Metamucil, Konsyl, Effersyllium, Per Diem Fiber, or the less expensive generic preparation in drug and health food stores. Although labeled a laxative, it really is not a laxative.  o Methylcellulose -- This is another fiber derived from wood which also retains water. It is available as Citrucel. o Polyethylene Glycol - and "artificial" fiber commonly called Miralax or Glycolax.  It is helpful for people with gassy or bloated feelings with regular fiber o Flax Seed - a less gassy fiber than psyllium . No reading or other relaxing activity while on the toilet. If bowel movements take longer than 5 minutes,  you are too constipated. . AVOID CONSTIPATION.  High fiber and water intake usually takes care of this.  Sometimes a laxative is needed to stimulate more frequent bowel movements, but  . Laxatives are not a good long-term solution as it can wear the colon out. o Osmotics (Milk of Magnesia, Fleets phosphosoda, Magnesium citrate, MiraLax, GoLytely) are safer than  o Stimulants (Senokot, Castor Oil, Dulcolax, Ex Lax)    o Do not take laxatives for more than 7days in a row. .  IF SEVERELY CONSTIPATED, try a Bowel Retraining Program: o Do not use laxatives.  o Eat a diet high in roughage, such as bran cereals and leafy vegetables.  o Drink six (6) ounces of prune or apricot juice each morning.  o Eat two (2) large servings of stewed fruit each day.  o Take one (1) heaping tablespoon of a psyllium-based bulking agent twice a day. Use sugar-free sweetener when possible to avoid excessive calories.  o Eat a normal breakfast.  o Set aside 15 minutes after breakfast to sit on the toilet, but do not strain to have a bowel movement.  o If you do not have a bowel movement by the third day, use an enema and repeat the above steps.   

## 2019-02-15 ENCOUNTER — Telehealth: Payer: Self-pay | Admitting: Family Medicine

## 2019-02-15 NOTE — Telephone Encounter (Signed)
Attempted to call patient to schedule Annual Wellness Visit, but patient did not answer. Will try to call patient again at a later time. SF °

## 2019-02-16 ENCOUNTER — Other Ambulatory Visit: Payer: Self-pay | Admitting: Family Medicine

## 2019-02-16 DIAGNOSIS — I10 Essential (primary) hypertension: Secondary | ICD-10-CM

## 2019-02-18 NOTE — Progress Notes (Signed)
Office: [phone]  /  Fax: [phone] TeleHealth Visit:  Zachary Michael has verbally consented to this TeleHealth visit today. The patient is located at home, the provider is located at the [facility] [facility] office. The participants in this visit include the listed provider and patient. The visit was conducted today via [email or web address] (28 minutes).  HPI:  Chief Complaint: OBESITY Zachary Michael is here to discuss his progress with his obesity treatment plan. He is on the keep a food journal with 1400-1500 calories and 90-100 grams of protein daily and states he is following his eating plan approximately 95-98 % of the time. He states he is active while walking the dog and doing yard work for 45 minutes 3-4 times per week.  Zachary Michael's weight is stable this visit. He states he knows how to make the scale move again. He had a virtual visit with [name] today due to COVID restrictions and weather concerns.  Diabetes II Zachary Michael has a diagnosis of diabetes type II. His last A1c was 6.5 on 11/04/2018. He is taking metformin 1,000 mg PO BID. He states his BGs range between 130 and 144, with a spike at 180.  Hypertension Zachary Michael has a diagnosis of hypertension. He is taking lisinopril and Coreg.   BP Readings from Last 3 Encounters:  01/29/19 (!) 96/54  01/08/19 (!) 107/58  12/18/18 110/65   Hyperlipidemia Zachary Michael has a diagnosis of hyperlipidemia. He is taking Zocor and fenofibrate. His previous panel was not at goal due to running out of his medicine.  Lab Results  Component Value Date   CHOL 221 (H) 10/04/2018   HDL 60 10/04/2018   LDLCALC 143 (H) 10/04/2018   LDLDIRECT 198.6 03/20/2009   TRIG 92 10/04/2018   CHOLHDL 3.9 01/24/2018   Lab Results  Component Value Date   ALT 10 10/04/2018   AST 14 10/04/2018   ALKPHOS 38 (L) 10/04/2018   BILITOT 0.4 10/04/2018   Vitamin D Deficiency Zachary Michael has a diagnosis of vitamin D deficiency. He is taking OTC Vit D 5,000 IU daily.  Coronary Artery  Disease Zachary Michael has a diagnosis of coronary artery disease. His last ASCVD was elevated at 21.5 %.  The 10-year ASCVD risk score Zachary George DC Jr., et al., 2013) is: 21.5%   Values used to calculate the score:     Age: 68 years     Sex: Male     Is Non-Hispanic African American: No     Diabetic: Yes     Tobacco smoker: No     Systolic Blood Pressure: 96 mmHg     Is BP treated: Yes     HDL Cholesterol: 60 mg/dL     Total Cholesterol: 221 mg/dL  ASSESSMENT AND PLAN:  Other hyperlipidemia - Plan: fenofibrate 160 MG tablet  Type 2 diabetes mellitus without complication, without long-term current use of insulin (HCC)  Essential hypertension  Vitamin D deficiency  Coronary artery disease involving native coronary artery of native heart without angina pectoris  Class 1 obesity with serious comorbidity and body mass index (BMI) of 34.0 to 34.9 in adult, unspecified obesity type  PLAN:  Diabetes II Zachary Michael has been given diabetes education by myself today. Good blood sugar control is important to decrease the likelihood of diabetic complications such as nephropathy, neuropathy, limb loss, blindness, coronary artery disease, and death. Intensive lifestyle modification including diet, exercise and weight loss were discussed as the first line treatment for diabetes. Zachary Michael will continue his treatment and we will recheck labs  at his next visit.  Hypertension Zachary Michael is working on healthy weight loss and exercise to improve blood pressure control. We will continue to monitor and will watch for signs of hypotension as he continues his lifestyle modifications.  Hyperlipidemia Intensive lifestyle modifications as the first line treatment for hyperlipidemia. We discussed many lifestyle modifications today and Zachary Michael will continue to work on diet, exercise and weight loss efforts. Zachary Michael agrees to continue taking fenofibrate 160 mg PO daily #30 and we will refill for 1 month. Zachary Michael agrees to  follow up with our clinic in 3 weeks.  Vitamin D Deficiency Low vitamin D level contributes to fatigue and are associated with obesity, breast, and colon cancer. Zachary Michael agrees to continue taking OTC Vit D 5,000 IU daily and will follow up for routine testing of vitamin D, at least 2-3 times per year to avoid over-replacement. Zachary Michael agrees to follow up with our clinic in 3 weeks.  Coronary Artery Disease Zachary Michael is to continue to minimize risk factors.  Obesity Zachary Michael is currently in the action stage of change. As such, his goal is to continue with weight loss efforts.  He has agreed to keep a food journal with 1400-1450 calories and 90+ grams of protein daily. Zachary Michael has been instructed to work up to a goal of 150 minutes of combined cardio and strengthening exercise per week for weight loss and overall health benefits. We discussed the following Behavioral Modification Strategies today: increase H20 intake and planning for success.   Zachary Michael has agreed to follow up with our clinic in 3 weeks. He was informed of the importance of frequent follow up visits to maximize his success with intensive lifestyle modifications for his multiple health conditions.  ALLERGIES: No Known Allergies  MEDICATIONS: Current Outpatient Medications on File Prior to Visit  Medication Sig Dispense Refill  . albuterol (PROAIR HFA) 108 (90 BASE) MCG/ACT inhaler Inhale 2 puffs into the lungs every 6 (six) hours as needed for wheezing or shortness of breath. 1 Inhaler 4  . aspirin 81 MG tablet Take 81 mg by mouth daily.      [name] b complex vitamins tablet Take 1 tablet by mouth daily.    . calcium carbonate (CALCIUM 600) 600 MG TABS tablet Take 600 mg by mouth daily with breakfast.    . carvedilol (COREG) 6.25 MG tablet Take 1 tablet (6.25 mg total) by mouth 2 (two) times daily with a meal. 60 tablet 0  . cholecalciferol (VITAMIN D3) 25 MCG (1000 UT) tablet Take 5,000 Units by mouth daily.    . Cinnamon 500 MG  capsule Take 500 mg by mouth daily.    [name] co-enzyme Q-10 30 MG capsule Take 30 mg by mouth 3 (three) times daily.    . ferrous sulfate 325 (65 FE) MG EC tablet Take 325 mg by mouth 3 (three) times daily with meals.    . fluticasone (FLONASE) 50 MCG/ACT nasal spray Place 2 sprays into both nostrils daily. 16 g 6  . glucose blood (ONETOUCH VERIO) test strip Use as instructed 100 each 0  . lisinopril (ZESTRIL) 10 MG tablet Take 5 mg by mouth daily.    [name] loratadine (CLARITIN) 10 MG tablet Take 1 tablet (10 mg total) by mouth daily. 30 tablet 11  . metFORMIN (GLUCOPHAGE) 1000 MG tablet TAKE 1 TABLET (1,000 MG TOTAL) BY MOUTH 2 (TWO) TIMES DAILY WITH A MEAL. 180 tablet 1  . Multiple Vitamin (MULTIVITAMIN) capsule Take 1 capsule by mouth daily.    [name]  nitroGLYCERIN (NITROSTAT) 0.4 MG SL tablet Place 1 tablet (0.4 mg total) under the tongue every 5 (five) minutes as needed for chest pain. 25 tablet 5  . simvastatin (ZOCOR) 40 MG tablet TAKE 1 TABLET BY MOUTH EVERYDAY AT BEDTIME 30 tablet 0  . Zinc 100 MG TABS Take 1 tablet by mouth daily.     No current facility-administered medications on file prior to visit.    PAST MEDICAL HISTORY: Past Medical History:  Diagnosis Date  . Asthma   . Back pain   . CAD (coronary artery disease)    Mild  . Diabetes mellitus   . Heart valve problem   . HTN (hypertension)   . Hyperlipidemia   . Joint pain   . OSA (obstructive sleep apnea) 08/09/2017  . Palpitations   . SOB (shortness of breath)   . Subluxation    L2-3-4    PAST SURGICAL HISTORY: Past Surgical History:  Procedure Laterality Date  . APPENDECTOMY    . CARDIAC CATHETERIZATION     We recently performed a which revealed minor coronary artery irregularities. He had normal left ventricle systolic function with an EF of 60%  . CARDIOVASCULAR STRESS TEST     EF 63%  . TONSILLECTOMY    . TUMOR REMOVAL     LEFT ARM    SOCIAL HISTORY: Social History   Tobacco Use  . Smoking status: Former  Smoker    Years: 7.00    Types: Pipe    Quit date: 1980    Years since quitting: 41.0  . Smokeless tobacco: Never Used  Substance Use Topics  . Alcohol use: Yes    Comment: occassional  . Drug use: No    FAMILY HISTORY: Family History  Problem Relation Age of Onset  . Pneumonia Mother   . Osteoporosis Mother   . Arthritis Mother   . Diabetes Mother   . Hypertension Mother   . Cancer Father        leukemia, lung  . Cancer Brother   . Diabetes Maternal Aunt   . Hypertension Maternal Aunt   . Hypertension Maternal Aunt   . Lung cancer Unknown     ROS: Review of Systems  Constitutional: Negative for weight loss.    PHYSICAL EXAM: There were no vitals taken for this visit. There is no height or weight on file to calculate BMI. Physical Exam Vitals reviewed.  Constitutional:      Appearance: Normal appearance. He is obese.  Cardiovascular:     Rate and Rhythm: Normal rate.     Pulses: Normal pulses.  Pulmonary:     Effort: Pulmonary effort is normal.     Breath sounds: Normal breath sounds.  Musculoskeletal:        General: Normal range of motion.  Skin:    General: Skin is warm and dry.  Neurological:     Mental Status: He is alert and oriented to person, place, and time.  Psychiatric:        Mood and Affect: Mood normal.        Behavior: Behavior normal.     RECENT LABS AND TESTS: BMET    Component Value Date/Time   NA 139 10/04/2018 0910   K 4.2 10/04/2018 0910   CL 102 10/04/2018 0910   CO2 20 10/04/2018 0910   GLUCOSE 129 (H) 10/04/2018 0910   GLUCOSE 117 (H) 11/13/2017 0938   BUN 18 10/04/2018 0910   CREATININE 0.82 10/04/2018 0910   CALCIUM 9.7 10/04/2018  0910   GFRNONAA 91 10/04/2018 0910   GFRAA 105 10/04/2018 0910   Lab Results  Component Value Date   HGBA1C 6.5 (H) 10/04/2018   HGBA1C 6.4 (H) 05/07/2018   HGBA1C 6.9 (H) 11/13/2017   HGBA1C 6.9 (H) 08/01/2017   HGBA1C 7.5 (H) 05/08/2017   Lab Results  Component Value Date    INSULIN 2.5 (L) 10/04/2018   INSULIN 4.3 05/07/2018   INSULIN 7.9 01/24/2018   CBC    Component Value Date/Time   WBC 7.3 11/20/2018 0958   RBC 4.49 11/20/2018 0958   HGB 13.2 11/20/2018 0958   HGB 13.0 01/24/2018 1307   HCT 39.0 11/20/2018 0958   HCT 38.1 01/24/2018 1307   PLT 236.0 11/20/2018 0958   PLT 265 01/24/2018 1307   MCV 87.0 11/20/2018 0958   MCV 86 01/24/2018 1307   MCH 29.2 01/24/2018 1307   MCHC 33.9 11/20/2018 0958   RDW 13.5 11/20/2018 0958   RDW 12.8 01/24/2018 1307   LYMPHSABS 1.3 11/20/2018 0958   LYMPHSABS 1.2 01/24/2018 1307   MONOABS 0.5 11/20/2018 0958   EOSABS 0.3 11/20/2018 0958   EOSABS 0.4 01/24/2018 1307   BASOSABS 0.1 11/20/2018 0958   BASOSABS 0.1 01/24/2018 1307   Iron/TIBC/Ferritin/ %Sat    Component Value Date/Time   IRON 95 11/20/2018 0958   FERRITIN 74.9 11/20/2018 0958   IRONPCTSAT 23.0 11/20/2018 0958   Lipid Panel     Component Value Date/Time   CHOL 221 (H) 10/04/2018 0910   TRIG 92 10/04/2018 0910   HDL 60 10/04/2018 0910   CHOLHDL 3.9 01/24/2018 1307   CHOLHDL 2 11/13/2017 0938   VLDL 11.4 11/13/2017 0938   LDLCALC 143 (H) 10/04/2018 0910   LDLDIRECT 198.6 03/20/2009 1127   Hepatic Function Panel     Component Value Date/Time   PROT 6.6 10/04/2018 0910   ALBUMIN 4.6 10/04/2018 0910   AST 14 10/04/2018 0910   ALT 10 10/04/2018 0910   ALKPHOS 38 (L) 10/04/2018 0910   BILITOT 0.4 10/04/2018 0910   BILIDIR 0.1 08/14/2014 0824      Component Value Date/Time   TSH 1.050 01/24/2018 1307   TSH 1.28 11/07/2016 1008   TSH 0.98 09/27/2011 0945    I, Burt KnackSharon Martin, am acting as transcriptionist for Helane RimaErica Izeyah Deike, DO  I have reviewed the above documentation for accuracy and completeness, and I agree with the above. Helane Rima- Jayten Gabbard, DO

## 2019-03-07 ENCOUNTER — Other Ambulatory Visit (INDEPENDENT_AMBULATORY_CARE_PROVIDER_SITE_OTHER): Payer: Self-pay | Admitting: Family Medicine

## 2019-03-07 ENCOUNTER — Ambulatory Visit (INDEPENDENT_AMBULATORY_CARE_PROVIDER_SITE_OTHER): Payer: Medicare Other | Admitting: Family Medicine

## 2019-03-07 ENCOUNTER — Other Ambulatory Visit: Payer: Self-pay | Admitting: Family Medicine

## 2019-03-07 ENCOUNTER — Encounter (INDEPENDENT_AMBULATORY_CARE_PROVIDER_SITE_OTHER): Payer: Self-pay | Admitting: Family Medicine

## 2019-03-07 ENCOUNTER — Other Ambulatory Visit: Payer: Self-pay

## 2019-03-07 VITALS — BP 126/65 | HR 60 | Temp 98.1°F | Ht 69.0 in | Wt 236.0 lb

## 2019-03-07 DIAGNOSIS — E7849 Other hyperlipidemia: Secondary | ICD-10-CM | POA: Diagnosis not present

## 2019-03-07 DIAGNOSIS — I2510 Atherosclerotic heart disease of native coronary artery without angina pectoris: Secondary | ICD-10-CM

## 2019-03-07 DIAGNOSIS — I10 Essential (primary) hypertension: Secondary | ICD-10-CM | POA: Diagnosis not present

## 2019-03-07 DIAGNOSIS — E1151 Type 2 diabetes mellitus with diabetic peripheral angiopathy without gangrene: Secondary | ICD-10-CM

## 2019-03-07 DIAGNOSIS — Z6835 Body mass index (BMI) 35.0-35.9, adult: Secondary | ICD-10-CM | POA: Diagnosis not present

## 2019-03-07 DIAGNOSIS — E119 Type 2 diabetes mellitus without complications: Secondary | ICD-10-CM

## 2019-03-07 DIAGNOSIS — E559 Vitamin D deficiency, unspecified: Secondary | ICD-10-CM

## 2019-03-07 DIAGNOSIS — IMO0002 Reserved for concepts with insufficient information to code with codable children: Secondary | ICD-10-CM

## 2019-03-07 NOTE — Telephone Encounter (Signed)
Last OV 11/20/18 Last refill Lisinopril - historical provider Metformin 09/03/18 #180/1 Next OV 05/22/19

## 2019-03-08 LAB — ANEMIA PANEL
Ferritin: 161 ng/mL (ref 30–400)
Folate, Hemolysate: 439 ng/mL
Folate, RBC: 896 ng/mL (ref >498)
Hematocrit: 49 % (ref 37.5–51.0)
Iron Saturation: 22 % (ref 15–55)
Iron: 82 ug/dL (ref 38–169)
Retic Ct Pct: 1 % (ref 0.6–2.6)
Total Iron Binding Capacity: 375 ug/dL (ref 250–450)
UIBC: 293 ug/dL (ref 111–343)
Vitamin B-12: 869 pg/mL (ref 232–1245)

## 2019-03-09 LAB — CBC WITH DIFFERENTIAL/PLATELET
Basophils Absolute: 0.1 10*3/uL (ref 0.0–0.2)
Basos: 1 %
EOS (ABSOLUTE): 0.2 10*3/uL (ref 0.0–0.4)
Eos: 3 %
Hematocrit: 51.4 % — ABNORMAL HIGH (ref 37.5–51.0)
Hemoglobin: 17.1 g/dL (ref 13.0–17.7)
Immature Grans (Abs): 0 10*3/uL (ref 0.0–0.1)
Immature Granulocytes: 0 %
Lymphocytes Absolute: 1 10*3/uL (ref 0.7–3.1)
Lymphs: 16 %
MCH: 29.3 pg (ref 26.6–33.0)
MCHC: 33.3 g/dL (ref 31.5–35.7)
MCV: 88 fL (ref 79–97)
Monocytes Absolute: 0.4 10*3/uL (ref 0.1–0.9)
Monocytes: 6 %
Neutrophils Absolute: 4.6 10*3/uL (ref 1.4–7.0)
Neutrophils: 74 %
Platelets: 207 10*3/uL (ref 150–450)
RBC: 5.83 x10E6/uL — ABNORMAL HIGH (ref 4.14–5.80)
RDW: 12.6 % (ref 11.6–15.4)
WBC: 6.3 10*3/uL (ref 3.4–10.8)

## 2019-03-09 LAB — COMPREHENSIVE METABOLIC PANEL
ALT: 12 IU/L (ref 0–44)
AST: 19 IU/L (ref 0–40)
Albumin/Globulin Ratio: 2.3 — ABNORMAL HIGH (ref 1.2–2.2)
Albumin: 4.9 g/dL — ABNORMAL HIGH (ref 3.8–4.8)
Alkaline Phosphatase: 40 IU/L (ref 39–117)
BUN/Creatinine Ratio: 22 (ref 10–24)
BUN: 18 mg/dL (ref 8–27)
Bilirubin Total: 0.3 mg/dL (ref 0.0–1.2)
CO2: 22 mmol/L (ref 20–29)
Calcium: 10.9 mg/dL — ABNORMAL HIGH (ref 8.6–10.2)
Chloride: 103 mmol/L (ref 96–106)
Creatinine, Ser: 0.81 mg/dL (ref 0.76–1.27)
GFR calc Af Amer: 105 mL/min/{1.73_m2} (ref >59)
GFR calc non Af Amer: 91 mL/min/{1.73_m2} (ref >59)
Globulin, Total: 2.1 g/dL (ref 1.5–4.5)
Glucose: 162 mg/dL — ABNORMAL HIGH (ref 65–99)
Potassium: 4.5 mmol/L (ref 3.5–5.2)
Sodium: 141 mmol/L (ref 134–144)
Total Protein: 7 g/dL (ref 6.0–8.5)

## 2019-03-09 LAB — T4, FREE: Free T4: 1.56 ng/dL (ref 0.82–1.77)

## 2019-03-09 LAB — INSULIN, RANDOM: INSULIN: 6.3 u[IU]/mL (ref 2.6–24.9)

## 2019-03-09 LAB — HEMOGLOBIN A1C
Est. average glucose Bld gHb Est-mCnc: 157 mg/dL
Hgb A1c MFr Bld: 7.1 % — ABNORMAL HIGH (ref 4.8–5.6)

## 2019-03-09 LAB — VITAMIN D 25 HYDROXY (VIT D DEFICIENCY, FRACTURES): Vit D, 25-Hydroxy: 58 ng/mL (ref 30.0–100.0)

## 2019-03-09 LAB — TSH: TSH: 1.51 u[IU]/mL (ref 0.450–4.500)

## 2019-03-09 LAB — T3: T3, Total: 86 ng/dL (ref 71–180)

## 2019-03-12 ENCOUNTER — Encounter (INDEPENDENT_AMBULATORY_CARE_PROVIDER_SITE_OTHER): Payer: Self-pay | Admitting: Family Medicine

## 2019-03-12 NOTE — Progress Notes (Signed)
Chief Complaint:   OBESITY Zachary Michael is here to discuss his progress with his obesity treatment plan along with follow-up of his obesity related diagnoses. Zachary Michael is keeping a food journal and adhering to recommended goals of 1400-1450 calories and 90 grams of protein and states he is following his eating plan approximately 75% of the time. Zachary Michael states he is walking the dog for 2 miles for 7 times per week.  Today's visit was #: 25 Starting weight: 301 lbs Starting date: 01/24/2018 Today's weight: 236 lbs Today's date: 03/07/2019 Total lbs lost to date: 65 lbs Total lbs lost since last in-office visit: 0  Interim History: Zachary Michael states he was off the plan over the holidays.  He enjoyed more sweets.  He has increased the amount of walking he is doing since he has not been working in the yard.  Subjective:   1. Type 2 diabetes mellitus without complication, without long-term current use of insulin (HCC) Patient is currently taking metformin.  His fasting blood glucose have been 130 fo rthe low and 187 for the high this morning.  He says he had an apple danish to eat last night.  Lab Results  Component Value Date   HGBA1C 7.1 (H) 03/07/2019   HGBA1C 6.5 (H) 10/04/2018   HGBA1C 6.4 (H) 05/07/2018   Lab Results  Component Value Date   MICROALBUR 1.2 11/06/2014   LDLCALC 143 (H) 10/04/2018   CREATININE 0.81 03/07/2019   2. Essential hypertension Review: taking lisinopril and Coreg as instructed with no medication side effects noted.   BP Readings from Last 3 Encounters:  03/07/19 126/65  01/29/19 (!) 96/54  01/08/19 (!) 107/58   3. Other hyperlipidemia Medication(s) reviewed.  Taking Zocor and fenofibrate.  Patient denies myalgias.   Lab Results  Component Value Date   CHOL 221 (H) 10/04/2018   HDL 60 10/04/2018   LDLCALC 143 (H) 10/04/2018   LDLDIRECT 198.6 03/20/2009   TRIG 92 10/04/2018   CHOLHDL 3.9 01/24/2018   Lab Results  Component Value Date   ALT 12 03/07/2019   AST 19 03/07/2019   ALKPHOS 40 03/07/2019   BILITOT 0.3 03/07/2019   4. Vitamin D deficiency He's Vitamin D level was 53.9 on 05/07/2018. He is currently taking vit D. He denies nausea, vomiting or muscle weakness.  5. Coronary artery disease involving native coronary artery of native heart without angina pectoris The 10-year ASCVD risk score [name], et al., 2013) is: 32.6%   Values used to calculate the score:     Age: 69 years     Sex: Male     Is Non-Hispanic African American: No     Diabetic: Yes     Tobacco smoker: No     Systolic Blood Pressure: 126 mmHg     Is BP treated: Yes     HDL Cholesterol: 60 mg/dL     Total Cholesterol: 221 mg/dL  Assessment/Plan:   1. Type 2 diabetes mellitus without complication, without long-term current use of insulin (HCC) Zachary Michael has been given diabetes education by myself today. Good blood sugar control is important to decrease the likelihood of diabetic complications such as nephropathy, neuropathy, limb loss, blindness, coronary artery disease, and death. Intensive lifestyle modification including diet, exercise and weight loss were discussed as the first line treatment for diabetes.   Orders - Comprehensive Metabolic Panel (CMET) - HgB A1c - Insulin, random - T3 - T4, free - TSH  2. Essential hypertension Zachary Michael  is working on healthy weight loss and exercise to improve blood pressure control. We will watch for signs of hypotension as he continues his lifestyle modifications.  Orders - CBC w/Diff/Platelet  3. Other hyperlipidemia Cardiovascular risk and specific lipid/LDL goals reviewed.  We discussed several lifestyle modifications today and Zachary Michael will continue to work on diet, exercise and weight loss efforts. Orders and follow up as documented in patient record.   Counseling Intensive lifestyle modifications are the first line treatment for this issue. . Dietary changes: Increase soluble fiber.  Decrease simple carbohydrates. . Exercise changes: Moderate to vigorous-intensity aerobic activity 150 minutes per week if tolerated. . Lipid-lowering medications: see documented in medical record.  Orders - Anemia panel  4. Vitamin D deficiency Low Vitamin D level contributes to fatigue and are associated with obesity, breast, and colon cancer. He agrees to continue to take Vitamin D @5 ,000 IU every day and will follow-up for routine testing of vitamin D, at least 2-3 times per year to avoid over-replacement.  Orders - Vitamin D (25 hydroxy)  5. Coronary artery disease involving native coronary artery of native heart without angina pectoris Will monitor.  6. Class 2 severe obesity with serious comorbidity and body mass index (BMI) of 35.0 to 35.9 in adult, unspecified obesity type Zachary Michael) Zachary Michael is currently in the action stage of change. As such, his goal is to continue with weight loss efforts. He has agreed to keeping a food journal and adhering to recommended goals of 1400-1450 calories and 90 grams of protein.   We discussed the following exercise goals today: Older adults should follow the adult guidelines. When older adults cannot meet the adult guidelines, they should be as physically active as their abilities and conditions will allow.  Older adults should do exercises that maintain or improve balance if they are at risk of falling.   We discussed the following behavioral modification strategies today: increasing lean protein intake, increasing water intake and keeping healthy foods in the home.  Zachary Michael has agreed to follow-up with our clinic in 2 weeks. He was informed of the importance of frequent follow-up visits to maximize his success with intensive lifestyle modifications for his multiple health conditions.   Zachary Michael was informed we would discuss his lab results at his next visit unless there is a critical issue that needs to be addressed sooner. Zachary Michael agreed to keep  his next visit at the agreed upon time to discuss these results.  Objective:   Blood pressure 126/65, pulse 60, temperature 98.1 F (36.7 C), temperature source Oral, height 5\' 9"  (1.753 m), weight 236 lb (107 kg), SpO2 96 %. Body mass index is 34.85 kg/m.  General: Cooperative, alert, well developed, in no acute distress. HEENT: Conjunctivae and lids unremarkable. Neck: No thyromegaly.  Cardiovascular: Regular rhythm.  Lungs: Normal work of breathing. Extremities: No edema.  Neurologic: No focal deficits.   Lab Results  Component Value Date   CREATININE 0.81 03/07/2019   BUN 18 03/07/2019   NA 141 03/07/2019   K 4.5 03/07/2019   CL 103 03/07/2019   CO2 22 03/07/2019   Lab Results  Component Value Date   ALT 12 03/07/2019   AST 19 03/07/2019   ALKPHOS 40 03/07/2019   BILITOT 0.3 03/07/2019   Lab Results  Component Value Date   HGBA1C 7.1 (H) 03/07/2019   HGBA1C 6.5 (H) 10/04/2018   HGBA1C 6.4 (H) 05/07/2018   HGBA1C 6.9 (H) 11/13/2017   HGBA1C 6.9 (H) 08/01/2017  Lab Results  Component Value Date   INSULIN 6.3 03/07/2019   INSULIN 2.5 (L) 10/04/2018   INSULIN 4.3 05/07/2018   INSULIN 7.9 01/24/2018   Lab Results  Component Value Date   TSH 1.510 03/07/2019   Lab Results  Component Value Date   CHOL 221 (H) 10/04/2018   HDL 60 10/04/2018   LDLCALC 143 (H) 10/04/2018   LDLDIRECT 198.6 03/20/2009   TRIG 92 10/04/2018   CHOLHDL 3.9 01/24/2018   Lab Results  Component Value Date   WBC 6.3 03/07/2019   HGB 17.1 03/07/2019   HCT 49.0 03/07/2019   MCV 88 03/07/2019   PLT 207 03/07/2019   Lab Results  Component Value Date   IRON 82 03/07/2019   TIBC 375 03/07/2019   FERRITIN 161 03/07/2019    Obesity Behavioral Intervention Documentation for Insurance:   Approximately 15 minutes were spent on the discussion below.  ASK: We discussed the diagnosis of obesity with Zachary Michael today and Zachary Michael agreed to give [name] permission to discuss obesity  behavioral modification therapy today.  ASSESS: Zachary Michael has the diagnosis of obesity and his BMI today is 35.0. Zachary Michael is in the action stage of change.   ADVISE: Zachary Michael was educated on the multiple health risks of obesity as well as the benefit of weight loss to improve his health. He was advised of the need for long term treatment and the importance of lifestyle modifications to improve his current health and to decrease his risk of future health problems.  AGREE: Multiple dietary modification options and treatment options were discussed and Zachary Michael agreed to follow the recommendations documented in the above note.  ARRANGE: Zachary Michael was educated on the importance of frequent visits to treat obesity as outlined per CMS and USPSTF guidelines and agreed to schedule his next follow up appointment today.  Attestation Statements:   Reviewed by clinician on day of visit: allergies, medications, problem list, medical history, surgical history, family history, social history, and previous encounter notes.  I, Water quality scientist, CMA, am acting as Location manager for [employer], DO.  I have reviewed the above documentation for accuracy and completeness, and I agree with the above. Zachary Deutscher, DO

## 2019-03-14 ENCOUNTER — Other Ambulatory Visit (INDEPENDENT_AMBULATORY_CARE_PROVIDER_SITE_OTHER): Payer: Self-pay | Admitting: Family Medicine

## 2019-03-14 DIAGNOSIS — E7849 Other hyperlipidemia: Secondary | ICD-10-CM

## 2019-03-21 ENCOUNTER — Other Ambulatory Visit: Payer: Self-pay

## 2019-03-21 ENCOUNTER — Encounter (INDEPENDENT_AMBULATORY_CARE_PROVIDER_SITE_OTHER): Payer: Self-pay | Admitting: Family Medicine

## 2019-03-21 ENCOUNTER — Ambulatory Visit (INDEPENDENT_AMBULATORY_CARE_PROVIDER_SITE_OTHER): Payer: Medicare Other | Admitting: Family Medicine

## 2019-03-21 VITALS — BP 119/56 | HR 65 | Temp 97.9°F | Ht 69.0 in | Wt 236.0 lb

## 2019-03-21 DIAGNOSIS — E7849 Other hyperlipidemia: Secondary | ICD-10-CM | POA: Diagnosis not present

## 2019-03-21 DIAGNOSIS — Z6834 Body mass index (BMI) 34.0-34.9, adult: Secondary | ICD-10-CM | POA: Diagnosis not present

## 2019-03-21 DIAGNOSIS — E669 Obesity, unspecified: Secondary | ICD-10-CM | POA: Diagnosis not present

## 2019-03-21 DIAGNOSIS — E1165 Type 2 diabetes mellitus with hyperglycemia: Secondary | ICD-10-CM

## 2019-03-21 DIAGNOSIS — I2510 Atherosclerotic heart disease of native coronary artery without angina pectoris: Secondary | ICD-10-CM

## 2019-03-21 NOTE — Progress Notes (Signed)
Chief Complaint:   OBESITY Zachary Michael is here to discuss his progress with his obesity treatment plan along with follow-up of his obesity related diagnoses. Zachary Michael is on keeping a food journal and adhering to recommended goals of 1400-1450 calories and 90 grams of protein and states he is following his eating plan approximately 75% of the time. Zachary Michael states he is walking the dog for 2 miles 7 times per week.  Today's visit was #: 36 Starting weight: 301 lbs Starting date: 01/24/2018 Today's weight: 236 lbs Today's date: 03/21/2019 Total lbs lost to date: 65 lbs Total lbs lost since last in-office visit: 0  Interim History: Zachary Michael voices no real change in consistency of journaling.  Always exceeding protein goal but also exceeding calorie goal.  Estimates 1600-1700 calories daily (last RMR 1875).  Subjective:   1. Other hyperlipidemia Zachary Michael has hyperlipidemia and has been trying to improve his cholesterol levels with intensive lifestyle modification including a low saturated fat diet, exercise and weight loss. He denies any chest pain, claudication or myalgias.  Currently taking Coreg.  Lab Results  Component Value Date   ALT 12 03/07/2019   AST 19 03/07/2019   ALKPHOS 40 03/07/2019   BILITOT 0.3 03/07/2019   Lab Results  Component Value Date   CHOL 221 (H) 10/04/2018   HDL 60 10/04/2018   LDLCALC 143 (H) 10/04/2018   LDLDIRECT 198.6 03/20/2009   TRIG 92 10/04/2018   CHOLHDL 3.9 01/24/2018   2. Type 2 diabetes mellitus with hyperglycemia, without long-term current use of insulin (HCC) Medications reviewed. Home glucose monitoring: is performed regularly.  Lab Results  Component Value Date   HGBA1C 7.1 (H) 03/07/2019   HGBA1C 6.5 (H) 10/04/2018   HGBA1C 6.4 (H) 05/07/2018   Lab Results  Component Value Date   MICROALBUR 1.2 11/06/2014   LDLCALC 143 (H) 10/04/2018   CREATININE 0.81 03/07/2019   Lab Results  Component Value Date   INSULIN 6.3 03/07/2019   INSULIN 2.5 (L) 10/04/2018   INSULIN 4.3 05/07/2018   INSULIN 7.9 01/24/2018    3. Hypercalcemia Patient has no muscle weakness, nausea, or vomiting.  Lab Results  Component Value Date   CALCIUM 10.9 (H) 03/07/2019   Assessment/Plan:   1. Other hyperlipidemia Cardiovascular risk and specific lipid/LDL goals reviewed.  We discussed several lifestyle modifications today and Pacey will continue to work on diet, exercise and weight loss efforts. Orders and follow up as documented in patient record.   Counseling Intensive lifestyle modifications are the first line treatment for this issue. . Dietary changes: Increase soluble fiber. Decrease simple carbohydrates. . Exercise changes: Moderate to vigorous-intensity aerobic activity 150 minutes per week if tolerated. . Lipid-lowering medications: see documented in medical record. - Lipid Panel With LDL/HDL Ratio - Comprehensive metabolic panel - simvastatin (ZOCOR) 40 MG tablet; TAKE 1 TABLET BY MOUTH EVERYDAY AT BEDTIME  Dispense: 30 tablet; Refill: 0  2. Type 2 diabetes mellitus with hyperglycemia, without long-term current use of insulin (HCC) Good blood sugar control is important to decrease the likelihood of diabetic complications such as nephropathy, neuropathy, limb loss, blindness, coronary artery disease, and death. Intensive lifestyle modification including diet, exercise and weight loss are the first line of treatment for diabetes.   3. Hypercalcemia Will check CMP today.  4. Class 1 obesity with serious comorbidity and body mass index (BMI) of 34.0 to 34.9 in adult, unspecified obesity type Zachary Michael is currently in the action stage of change. As such, his goal is  to continue with weight loss efforts. He has agreed to keeping a food journal and adhering to recommended goals of 1400-1450 calories and 75 grams of protein.   Exercise goals: Older adults should follow the adult guidelines. When older adults cannot meet the adult  guidelines, they should be as physically active as their abilities and conditions will allow.  Older adults should do exercises that maintain or improve balance if they are at risk of falling.   Behavioral modification strategies: increasing lean protein intake, increasing vegetables, meal planning and cooking strategies, keeping healthy foods in the home, planning for success and keeping a strict food journal.  Zachary Michael has agreed to follow-up with our clinic in 2 weeks. He was informed of the importance of frequent follow-up visits to maximize his success with intensive lifestyle modifications for his multiple health conditions.   Zachary Michael was informed we would discuss his lab results at his next visit unless there is a critical issue that needs to be addressed sooner. Zachary Michael agreed to keep his next visit at the agreed upon time to discuss these results.  Objective:   Blood pressure (!) 119/56, pulse 65, temperature 97.9 F (36.6 C), temperature source Oral, height 5\' 9"  (1.753 m), weight 236 lb (107 kg), SpO2 98 %. Body mass index is 34.85 kg/m.  General: Cooperative, alert, well developed, in no acute distress. HEENT: Conjunctivae and lids unremarkable. Cardiovascular: Regular rhythm.  Lungs: Normal work of breathing. Neurologic: No focal deficits.   Lab Results  Component Value Date   CREATININE 0.81 03/07/2019   BUN 18 03/07/2019   NA 141 03/07/2019   K 4.5 03/07/2019   CL 103 03/07/2019   CO2 22 03/07/2019   Lab Results  Component Value Date   ALT 12 03/07/2019   AST 19 03/07/2019   ALKPHOS 40 03/07/2019   BILITOT 0.3 03/07/2019   Lab Results  Component Value Date   HGBA1C 7.1 (H) 03/07/2019   HGBA1C 6.5 (H) 10/04/2018   HGBA1C 6.4 (H) 05/07/2018   HGBA1C 6.9 (H) 11/13/2017   HGBA1C 6.9 (H) 08/01/2017   Lab Results  Component Value Date   INSULIN 6.3 03/07/2019   INSULIN 2.5 (L) 10/04/2018   INSULIN 4.3 05/07/2018   INSULIN 7.9 01/24/2018   Lab Results    Component Value Date   TSH 1.510 03/07/2019   Lab Results  Component Value Date   CHOL 221 (H) 10/04/2018   HDL 60 10/04/2018   LDLCALC 143 (H) 10/04/2018   LDLDIRECT 198.6 03/20/2009   TRIG 92 10/04/2018   CHOLHDL 3.9 01/24/2018   Lab Results  Component Value Date   WBC 6.3 03/07/2019   HGB 17.1 03/07/2019   HCT 49.0 03/07/2019   MCV 88 03/07/2019   PLT 207 03/07/2019   Lab Results  Component Value Date   IRON 82 03/07/2019   TIBC 375 03/07/2019   FERRITIN 161 03/07/2019    Obesity Behavioral Intervention Documentation for Insurance:   Approximately 15 minutes were spent on the discussion below.  ASK: We discussed the diagnosis of obesity with 05/05/2019 today and Zachary Michael agreed to give Zachary Michael permission to discuss obesity behavioral modification therapy today.  ASSESS: Jamyron has the diagnosis of obesity and his BMI today is 34.9. Zachary Michael is in the action stage of change.   ADVISE: Zachary Michael was educated on the multiple health risks of obesity as well as the benefit of weight loss to improve his health. He was advised of the need for long term treatment and the  importance of lifestyle modifications to improve his current health and to decrease his risk of future health problems.  AGREE: Multiple dietary modification options and treatment options were discussed and Creed agreed to follow the recommendations documented in the above note.  ARRANGE: Zachary Michael was educated on the importance of frequent visits to treat obesity as outlined per CMS and USPSTF guidelines and agreed to schedule his next follow up appointment today.  Attestation Statements:   Reviewed by clinician on day of visit: allergies, medications, problem list, medical history, surgical history, family history, social history, and previous encounter notes.  I, Insurance claims handler, CMA, am acting as transcriptionist for Debbra Riding, MD.  I have reviewed the above documentation for accuracy and completeness,  and I agree with the above. -  AK

## 2019-03-22 LAB — COMPREHENSIVE METABOLIC PANEL
ALT: 14 IU/L (ref 0–44)
AST: 16 IU/L (ref 0–40)
Albumin/Globulin Ratio: 2.1 (ref 1.2–2.2)
Albumin: 4.6 g/dL (ref 3.8–4.8)
Alkaline Phosphatase: 38 IU/L — ABNORMAL LOW (ref 39–117)
BUN/Creatinine Ratio: 25 — ABNORMAL HIGH (ref 10–24)
BUN: 21 mg/dL (ref 8–27)
Bilirubin Total: 0.5 mg/dL (ref 0.0–1.2)
CO2: 24 mmol/L (ref 20–29)
Calcium: 10.9 mg/dL — ABNORMAL HIGH (ref 8.6–10.2)
Chloride: 101 mmol/L (ref 96–106)
Creatinine, Ser: 0.85 mg/dL (ref 0.76–1.27)
GFR calc Af Amer: 103 mL/min/{1.73_m2} (ref >59)
GFR calc non Af Amer: 89 mL/min/{1.73_m2} (ref >59)
Globulin, Total: 2.2 g/dL (ref 1.5–4.5)
Glucose: 150 mg/dL — ABNORMAL HIGH (ref 65–99)
Potassium: 4.8 mmol/L (ref 3.5–5.2)
Sodium: 141 mmol/L (ref 134–144)
Total Protein: 6.8 g/dL (ref 6.0–8.5)

## 2019-03-22 LAB — LIPID PANEL WITH LDL/HDL RATIO
Cholesterol, Total: 149 mg/dL (ref 100–199)
HDL: 63 mg/dL (ref >39)
LDL Chol Calc (NIH): 74 mg/dL (ref 0–99)
LDL/HDL Ratio: 1.2 ratio (ref 0.0–3.6)
Triglycerides: 60 mg/dL (ref 0–149)
VLDL Cholesterol Cal: 12 mg/dL (ref 5–40)

## 2019-03-29 MED ORDER — SIMVASTATIN 40 MG PO TABS: ORAL_TABLET | ORAL | 0 refills | Status: DC

## 2019-04-16 ENCOUNTER — Other Ambulatory Visit: Payer: Self-pay

## 2019-04-16 ENCOUNTER — Ambulatory Visit (INDEPENDENT_AMBULATORY_CARE_PROVIDER_SITE_OTHER): Payer: Medicare Other | Admitting: Family Medicine

## 2019-04-16 VITALS — BP 125/62 | HR 60 | Temp 97.7°F | Ht 69.0 in | Wt 234.0 lb

## 2019-04-16 DIAGNOSIS — E669 Obesity, unspecified: Secondary | ICD-10-CM

## 2019-04-16 DIAGNOSIS — I2510 Atherosclerotic heart disease of native coronary artery without angina pectoris: Secondary | ICD-10-CM | POA: Diagnosis not present

## 2019-04-16 DIAGNOSIS — Z6834 Body mass index (BMI) 34.0-34.9, adult: Secondary | ICD-10-CM | POA: Diagnosis not present

## 2019-04-16 DIAGNOSIS — I10 Essential (primary) hypertension: Secondary | ICD-10-CM

## 2019-04-16 DIAGNOSIS — E1165 Type 2 diabetes mellitus with hyperglycemia: Secondary | ICD-10-CM | POA: Diagnosis not present

## 2019-04-16 MED ORDER — SIMVASTATIN 40 MG PO TABS: ORAL_TABLET | ORAL | 0 refills | Status: DC

## 2019-04-16 NOTE — Progress Notes (Signed)
Chief Complaint:   OBESITY Zachary Michael is here to discuss his progress with his obesity treatment plan along with follow-up of his obesity related diagnoses. Zachary Michael is keeping a food journal of 1400 to 1450 calories and 75 grams of protein daily and states he is following his eating plan approximately 95% of the time. Zachary Michael states he is walking the dog 50 minutes 7 times per week.  Today's visit was #: 26 Starting weight: 301 lbs Starting date: 01/24/2018 Today's weight: 234 lbs Today's date: 04/16/2019 Total lbs lost to date: 67 Total lbs lost since last in-office visit: 2  Interim History: Zachary Michael voices the last weeks have been more on track. He is staying around 1450 calories and he is easily hitting his protein goal. Zachary Michael has occasional hunger late at night, depending on when he ate dinner. He is eating at home more, but he did eat out one time in the past three weeks.  Subjective:   Type 2 diabetes mellitus with hyperglycemia, without long-term current use of insulin (HCC):  Zachary Michael's blood sugars average 140. Blood sugars have increased since stopping Pioglitazone. His A1c in early January was 7.1. He has no hypoglycemia. Zachary Michael is now on Metformin only, as well as ACE, statin and aspirin.  Lab Results  Component Value Date   HGBA1C 7.1 (H) 03/07/2019   HGBA1C 6.5 (H) 10/04/2018   HGBA1C 6.4 (H) 05/07/2018   Lab Results  Component Value Date   MICROALBUR 1.2 11/06/2014   LDLCALC 74 03/21/2019   CREATININE 0.85 03/21/2019   Lab Results  Component Value Date   INSULIN 6.3 03/07/2019   INSULIN 2.5 (L) 10/04/2018   INSULIN 4.3 05/07/2018   INSULIN 7.9 01/24/2018   Essential hypertension Zachary Michael's blood pressure is controlled today. He denies chest pain, chest pressure or headache. He has no feeling of hypotension. Zachary Michael is on statin.  BP Readings from Last 3 Encounters:  04/16/19 125/62  03/21/19 (!) 119/56  03/07/19 126/65   Lab Results    Component Value Date   CREATININE 0.85 03/21/2019   CREATININE 0.81 03/07/2019   CREATININE 0.82 10/04/2018    Assessment/Plan:   Type 2 diabetes mellitus with hyperglycemia, without long-term current use of insulin (HCC)  Good blood sugar control is important to decrease the likelihood of diabetic complications such as nephropathy, neuropathy, limb loss, blindness, coronary artery disease, and death. Intensive lifestyle modification including diet, exercise and weight loss are the first line of treatment for diabetes. Zachary Michael agrees to continue simvastatin (ZOCOR) 40 MG tablet 40 mg PO daily #30 with no refills.  Essential hypertension Zachary Michael is working on healthy weight loss and exercise to improve blood pressure control. We will follow up blood pressure at the next appointment. We will watch for signs of hypotension as he continues his lifestyle modifications.  Class 1 obesity with serious comorbidity and body mass index (BMI) of 34.0 to 34.9 in adult, unspecified obesity type Zachary Michael is currently in the action stage of change. As such, his goal is to continue with weight loss efforts. He has agreed to keeping a food journal and adhering to recommended goals of 1400 to 1450 calories and 90+ grams of protein daily.   Behavioral modification strategies: increasing lean protein intake, increasing vegetables, meal planning and cooking strategies, keeping healthy foods in the home and planning for success.  Zachary Michael has agreed to follow-up with our clinic in 3 weeks. He was informed of the importance of frequent follow-up visits to maximize  his success with intensive lifestyle modifications for his multiple health conditions.   Objective:   Blood pressure 125/62, pulse 60, temperature 97.7 F (36.5 C), temperature source Oral, height 5\' 9"  (1.753 m), weight 234 lb (106.1 kg), SpO2 96 %. Body mass index is 34.56 kg/m.  General: Cooperative, alert, well developed, in no acute  distress. HEENT: Conjunctivae and lids unremarkable. Cardiovascular: Regular rhythm.  Lungs: Normal work of breathing. Neurologic: No focal deficits.   Lab Results  Component Value Date   CREATININE 0.85 03/21/2019   BUN 21 03/21/2019   NA 141 03/21/2019   K 4.8 03/21/2019   CL 101 03/21/2019   CO2 24 03/21/2019   Lab Results  Component Value Date   ALT 14 03/21/2019   AST 16 03/21/2019   ALKPHOS 38 (L) 03/21/2019   BILITOT 0.5 03/21/2019   Lab Results  Component Value Date   HGBA1C 7.1 (H) 03/07/2019   HGBA1C 6.5 (H) 10/04/2018   HGBA1C 6.4 (H) 05/07/2018   HGBA1C 6.9 (H) 11/13/2017   HGBA1C 6.9 (H) 08/01/2017   Lab Results  Component Value Date   INSULIN 6.3 03/07/2019   INSULIN 2.5 (L) 10/04/2018   INSULIN 4.3 05/07/2018   INSULIN 7.9 01/24/2018   Lab Results  Component Value Date   TSH 1.510 03/07/2019   Lab Results  Component Value Date   CHOL 149 03/21/2019   HDL 63 03/21/2019   LDLCALC 74 03/21/2019   LDLDIRECT 198.6 03/20/2009   TRIG 60 03/21/2019   CHOLHDL 3.9 01/24/2018   Lab Results  Component Value Date   WBC 6.3 03/07/2019   HGB 17.1 03/07/2019   HCT 49.0 03/07/2019   MCV 88 03/07/2019   PLT 207 03/07/2019   Lab Results  Component Value Date   IRON 82 03/07/2019   TIBC 375 03/07/2019   FERRITIN 161 03/07/2019    Ref. Range 03/07/2019 16:30  Vitamin D, 25-Hydroxy Latest Ref Range: 30.0 - 100.0 ng/mL 58.0    Obesity Behavioral Intervention Documentation for Insurance:   Approximately 15 minutes were spent on the discussion below.  ASK: We discussed the diagnosis of obesity with Zachary Michael today and Zachary Michael agreed to give [name] permission to discuss obesity behavioral modification therapy today.  ASSESS: Zachary Michael has the diagnosis of obesity and his BMI today is 34.54. Zachary Michael is in the action stage of change.   ADVISE: Zachary Michael was educated on the multiple health risks of obesity as well as the benefit of weight loss to improve his  health. He was advised of the need for long term treatment and the importance of lifestyle modifications to improve his current health and to decrease his risk of future health problems.  AGREE: Multiple dietary modification options and treatment options were discussed and Hermann agreed to follow the recommendations documented in the above note.  ARRANGE: Zachary Michael was educated on the importance of frequent visits to treat obesity as outlined per CMS and USPSTF guidelines and agreed to schedule his next follow up appointment today.  Attestation Statements:   Reviewed by clinician on day of visit: allergies, medications, problem list, medical history, surgical history, family history, social history, and previous encounter notes.  I, Zachary Michael, am acting as transcriptionist for Zachary Jones, MD. I have reviewed the above documentation for accuracy and completeness, and I agree with the above. - Zachary Qua, MD

## 2019-05-09 ENCOUNTER — Encounter (INDEPENDENT_AMBULATORY_CARE_PROVIDER_SITE_OTHER): Payer: Self-pay | Admitting: Family Medicine

## 2019-05-09 ENCOUNTER — Other Ambulatory Visit: Payer: Self-pay

## 2019-05-09 ENCOUNTER — Ambulatory Visit (INDEPENDENT_AMBULATORY_CARE_PROVIDER_SITE_OTHER): Payer: Medicare Other | Admitting: Family Medicine

## 2019-05-09 VITALS — BP 130/63 | HR 67 | Temp 98.1°F | Ht 69.0 in | Wt 232.0 lb

## 2019-05-09 DIAGNOSIS — I10 Essential (primary) hypertension: Secondary | ICD-10-CM | POA: Diagnosis not present

## 2019-05-09 DIAGNOSIS — E669 Obesity, unspecified: Secondary | ICD-10-CM

## 2019-05-09 DIAGNOSIS — Z6834 Body mass index (BMI) 34.0-34.9, adult: Secondary | ICD-10-CM | POA: Diagnosis not present

## 2019-05-09 DIAGNOSIS — I2510 Atherosclerotic heart disease of native coronary artery without angina pectoris: Secondary | ICD-10-CM

## 2019-05-09 DIAGNOSIS — E7849 Other hyperlipidemia: Secondary | ICD-10-CM

## 2019-05-09 MED ORDER — SIMVASTATIN 40 MG PO TABS: ORAL_TABLET | ORAL | 0 refills | Status: DC

## 2019-05-09 MED ORDER — FENOFIBRATE 160 MG PO TABS: 160.0000 mg | ORAL_TABLET | Freq: Every day | ORAL | 0 refills | Status: DC

## 2019-05-09 NOTE — Progress Notes (Signed)
Chief Complaint:   OBESITY Zachary Michael is here to discuss his progress with his obesity treatment plan along with follow-up of his obesity related diagnoses. Zachary Michael is on keeping a food journal and adhering to recommended goals of 1400-1450 calories and 90+ grams of protein and states he is following his eating plan approximately 100% of the time. Zachary Michael states he is walking the dog for 60 minutes 7 times per week.  Today's visit was #: 23 Starting weight: 301 lbs Starting date: 01/24/2018 Today's weight: 232 lbs Today's date: 05/09/2019 Total lbs lost to date: 69 lbs Total lbs lost since last in-office visit: 2 lbs  Interim History: Zachary Michael voices that he has been eating the same as before.  He says he has occasional hunger, though he is still getting around 1450 calories with about 90 grams of protein a day.  He has had no new foods over the last few weeks.  He says he is always leaving 200-300 calories for dessert.  Subjective:   1. Other hyperlipidemia Zachary Michael has hyperlipidemia and has been trying to improve his cholesterol levels with intensive lifestyle modification including a low saturated fat diet, exercise and weight loss. He denies any chest pain, claudication or myalgias.  He is taking a statin and fenofibrate.  Lab Results  Component Value Date   ALT 14 03/21/2019   AST 16 03/21/2019   ALKPHOS 38 (L) 03/21/2019   BILITOT 0.5 03/21/2019   Lab Results  Component Value Date   CHOL 149 03/21/2019   HDL 63 03/21/2019   LDLCALC 74 03/21/2019   LDLDIRECT 198.6 03/20/2009   TRIG 60 03/21/2019   CHOLHDL 3.9 01/24/2018   2. Essential hypertension Review: taking medications as instructed, no medication side effects noted, no chest pain on exertion, no dyspnea on exertion, no swelling of ankles.  Zachary Michael's blood pressure is well-controlled. He takes lisinopril 5 mg daily.  BP Readings from Last 3 Encounters:  05/09/19 130/63  04/16/19 125/62  03/21/19 (!) 119/56    Assessment/Plan:   1. Other hyperlipidemia Cardiovascular risk and specific lipid/LDL goals reviewed.  We discussed several lifestyle modifications today and Zachary Michael will continue to work on diet, exercise and weight loss efforts. Orders and follow up as documented in patient record.   Counseling Intensive lifestyle modifications are the first line treatment for this issue. . Dietary changes: Increase soluble fiber. Decrease simple carbohydrates. . Exercise changes: Moderate to vigorous-intensity aerobic activity 150 minutes per week if tolerated. . Lipid-lowering medications: see documented in medical record. - simvastatin (ZOCOR) 40 MG tablet; TAKE 1 TABLET BY MOUTH EVERYDAY AT BEDTIME  Dispense: 30 tablet; Refill: 0 - fenofibrate 160 MG tablet; Take 1 tablet (160 mg total) by mouth daily.  Dispense: 30 tablet; Refill: 0  2. Essential hypertension Zachary Michael is working on healthy weight loss and exercise to improve blood pressure control. We will watch for signs of hypotension as he continues his lifestyle modifications.  3. Class 1 obesity with serious comorbidity and body mass index (BMI) of 34.0 to 34.9 in adult, unspecified obesity type Zachary Michael is currently in the action stage of change. As such, his goal is to continue with weight loss efforts. He has agreed to keeping a food journal and adhering to recommended goals of 1400-1450 calories and 90+ grams of protein.   Exercise goals: No exercise has been prescribed at this time.  Behavioral modification strategies: increasing lean protein intake, increasing vegetables, meal planning and cooking strategies, keeping healthy foods in the home  and planning for success.  Zachary Michael has agreed to follow-up with our clinic in 3 weeks. He was informed of the importance of frequent follow-up visits to maximize his success with intensive lifestyle modifications for his multiple health conditions.   Objective:   Blood pressure 130/63, pulse 67,  temperature 98.1 F (36.7 C), temperature source Oral, height 5\' 9"  (1.753 m), weight 232 lb (105.2 kg), SpO2 97 %. Body mass index is 34.26 kg/m.  General: Cooperative, alert, well developed, in no acute distress. HEENT: Conjunctivae and lids unremarkable. Cardiovascular: Regular rhythm.  Lungs: Normal work of breathing. Neurologic: No focal deficits.   Lab Results  Component Value Date   CREATININE 0.85 03/21/2019   BUN 21 03/21/2019   NA 141 03/21/2019   K 4.8 03/21/2019   CL 101 03/21/2019   CO2 24 03/21/2019   Lab Results  Component Value Date   ALT 14 03/21/2019   AST 16 03/21/2019   ALKPHOS 38 (L) 03/21/2019   BILITOT 0.5 03/21/2019   Lab Results  Component Value Date   HGBA1C 7.1 (H) 03/07/2019   HGBA1C 6.5 (H) 10/04/2018   HGBA1C 6.4 (H) 05/07/2018   HGBA1C 6.9 (H) 11/13/2017   HGBA1C 6.9 (H) 08/01/2017   Lab Results  Component Value Date   INSULIN 6.3 03/07/2019   INSULIN 2.5 (L) 10/04/2018   INSULIN 4.3 05/07/2018   INSULIN 7.9 01/24/2018   Lab Results  Component Value Date   TSH 1.510 03/07/2019   Lab Results  Component Value Date   CHOL 149 03/21/2019   HDL 63 03/21/2019   LDLCALC 74 03/21/2019   LDLDIRECT 198.6 03/20/2009   TRIG 60 03/21/2019   CHOLHDL 3.9 01/24/2018   Lab Results  Component Value Date   WBC 6.3 03/07/2019   HGB 17.1 03/07/2019   HCT 49.0 03/07/2019   MCV 88 03/07/2019   PLT 207 03/07/2019   Lab Results  Component Value Date   IRON 82 03/07/2019   TIBC 375 03/07/2019   FERRITIN 161 03/07/2019   Obesity Behavioral Intervention Documentation for Insurance:   Approximately 15 minutes were spent on the discussion below.  ASK: We discussed the diagnosis of obesity with 05/05/2019 today and Zachary Michael agreed to give Zachary Michael permission to discuss obesity behavioral modification therapy today.  ASSESS: Zachary Michael has the diagnosis of obesity and his BMI today is 34.4. Zachary Michael is in the action stage of change.   ADVISE: Zachary Michael  was educated on the multiple health risks of obesity as well as the benefit of weight loss to improve his health. He was advised of the need for long term treatment and the importance of lifestyle modifications to improve his current health and to decrease his risk of future health problems.  AGREE: Multiple dietary modification options and treatment options were discussed and Tyrin agreed to follow the recommendations documented in the above note.  ARRANGE: Jamorris was educated on the importance of frequent visits to treat obesity as outlined per CMS and USPSTF guidelines and agreed to schedule his next follow up appointment today.  Attestation Statements:   Reviewed by clinician on day of visit: allergies, medications, problem list, medical history, surgical history, family history, social history, and previous encounter notes.  I, Zachary Michael, CMA, am acting as transcriptionist for Insurance claims handler, MD.  I have reviewed the above documentation for accuracy and completeness, and I agree with the above. - Reuben Likes, MD

## 2019-05-21 ENCOUNTER — Other Ambulatory Visit: Payer: Self-pay

## 2019-05-22 ENCOUNTER — Ambulatory Visit: Payer: Medicare Other | Admitting: Family Medicine

## 2019-05-28 ENCOUNTER — Ambulatory Visit (INDEPENDENT_AMBULATORY_CARE_PROVIDER_SITE_OTHER): Payer: Medicare Other | Admitting: Family Medicine

## 2019-05-28 ENCOUNTER — Other Ambulatory Visit: Payer: Self-pay

## 2019-05-28 ENCOUNTER — Encounter: Payer: Self-pay | Admitting: Family Medicine

## 2019-05-28 VITALS — BP 110/58 | HR 52 | Temp 97.0°F | Resp 18 | Ht 69.0 in | Wt 238.0 lb

## 2019-05-28 DIAGNOSIS — E1169 Type 2 diabetes mellitus with other specified complication: Secondary | ICD-10-CM | POA: Diagnosis not present

## 2019-05-28 DIAGNOSIS — Z1211 Encounter for screening for malignant neoplasm of colon: Secondary | ICD-10-CM

## 2019-05-28 DIAGNOSIS — I10 Essential (primary) hypertension: Secondary | ICD-10-CM | POA: Diagnosis not present

## 2019-05-28 DIAGNOSIS — E119 Type 2 diabetes mellitus without complications: Secondary | ICD-10-CM | POA: Diagnosis not present

## 2019-05-28 DIAGNOSIS — E669 Obesity, unspecified: Secondary | ICD-10-CM

## 2019-05-28 DIAGNOSIS — E1165 Type 2 diabetes mellitus with hyperglycemia: Secondary | ICD-10-CM

## 2019-05-28 DIAGNOSIS — Z6834 Body mass index (BMI) 34.0-34.9, adult: Secondary | ICD-10-CM

## 2019-05-28 DIAGNOSIS — E785 Hyperlipidemia, unspecified: Secondary | ICD-10-CM | POA: Diagnosis not present

## 2019-05-28 DIAGNOSIS — I2510 Atherosclerotic heart disease of native coronary artery without angina pectoris: Secondary | ICD-10-CM

## 2019-05-28 DIAGNOSIS — E559 Vitamin D deficiency, unspecified: Secondary | ICD-10-CM

## 2019-05-28 DIAGNOSIS — J4520 Mild intermittent asthma, uncomplicated: Secondary | ICD-10-CM

## 2019-05-28 LAB — COMPREHENSIVE METABOLIC PANEL
ALT: 11 U/L (ref 0–53)
AST: 14 U/L (ref 0–37)
Albumin: 4.5 g/dL (ref 3.5–5.2)
Alkaline Phosphatase: 35 U/L — ABNORMAL LOW (ref 39–117)
BUN: 18 mg/dL (ref 6–23)
CO2: 27 mEq/L (ref 19–32)
Calcium: 9.7 mg/dL (ref 8.4–10.5)
Chloride: 103 mEq/L (ref 96–112)
Creatinine, Ser: 0.69 mg/dL (ref 0.40–1.50)
GFR: 113.53 mL/min (ref >60.00)
Glucose, Bld: 153 mg/dL — ABNORMAL HIGH (ref 70–99)
Potassium: 4.2 mEq/L (ref 3.5–5.1)
Sodium: 137 mEq/L (ref 135–145)
Total Bilirubin: 0.6 mg/dL (ref 0.2–1.2)
Total Protein: 6.6 g/dL (ref 6.0–8.3)

## 2019-05-28 LAB — LIPID PANEL
Cholesterol: 124 mg/dL (ref 0–200)
HDL: 53.4 mg/dL (ref >39.00)
LDL Cholesterol: 58 mg/dL (ref 0–99)
NonHDL: 70.44
Total CHOL/HDL Ratio: 2
Triglycerides: 60 mg/dL (ref 0.0–149.0)
VLDL: 12 mg/dL (ref 0.0–40.0)

## 2019-05-28 LAB — HEMOGLOBIN A1C: Hgb A1c MFr Bld: 6.8 % — ABNORMAL HIGH (ref 4.6–6.5)

## 2019-05-28 LAB — VITAMIN D 25 HYDROXY (VIT D DEFICIENCY, FRACTURES): VITD: 93.53 ng/mL (ref 30.00–100.00)

## 2019-05-28 MED ORDER — CARVEDILOL 3.125 MG PO TABS: 3.1250 mg | ORAL_TABLET | Freq: Two times a day (BID) | ORAL | 3 refills | Status: DC

## 2019-05-28 MED ORDER — LISINOPRIL 10 MG PO TABS: ORAL_TABLET | ORAL | 0 refills | Status: DC

## 2019-05-28 NOTE — Patient Instructions (Addendum)
Carbohydrate Counting for Diabetes Mellitus, Adult  Carbohydrate counting is a method of keeping track of how many carbohydrates you eat. Eating carbohydrates naturally increases the amount of sugar (glucose) in the blood. Counting how many carbohydrates you eat helps keep your blood glucose within normal limits, which helps you manage your diabetes (diabetes mellitus). It is important to know how many carbohydrates you can safely have in each meal. This is different for every person. A diet and nutrition specialist (registered dietitian) can help you make a meal plan and calculate how many carbohydrates you should have at each meal and snack. Carbohydrates are found in the following foods:  Grains, such as breads and cereals.  Dried beans and soy products.  Starchy vegetables, such as potatoes, peas, and corn.  Fruit and fruit juices.  Milk and yogurt.  Sweets and snack foods, such as cake, cookies, candy, chips, and soft drinks. How do I count carbohydrates? There are two ways to count carbohydrates in food. You can use either of the methods or a combination of both. Reading "Nutrition Facts" on packaged food The "Nutrition Facts" list is included on the labels of almost all packaged foods and beverages in the [place] It includes:  The serving size.  Information about nutrients in each serving, including the grams (g) of carbohydrate per serving. To use the "Nutrition Facts":  Decide how many servings you will have.  Multiply the number of servings by the number of carbohydrates per serving.  The resulting number is the total amount of carbohydrates that you will be having. Learning standard serving sizes of other foods When you eat carbohydrate foods that are not packaged or do not include "Nutrition Facts" on the label, you need to measure the servings in order to count the amount of carbohydrates:  Measure the foods that you will eat with a food scale or measuring cup, if  needed.  Decide how many standard-size servings you will eat.  Multiply the number of servings by 15. Most carbohydrate-rich foods have about 15 g of carbohydrates per serving. ? For example, if you eat 8 oz (170 g) of strawberries, you will have eaten 2 servings and 30 g of carbohydrates (2 servings x 15 g = 30 g).  For foods that have more than one food mixed, such as soups and casseroles, you must count the carbohydrates in each food that is included. The following list contains standard serving sizes of common carbohydrate-rich foods. Each of these servings has about 15 g of carbohydrates:   hamburger bun or  English muffin.   oz (15 mL) syrup.   oz (14 g) jelly.  1 slice of bread.  1 six-inch tortilla.  3 oz (85 g) cooked rice or pasta.  4 oz (113 g) cooked dried beans.  4 oz (113 g) starchy vegetable, such as peas, corn, or potatoes.  4 oz (113 g) hot cereal.  4 oz (113 g) mashed potatoes or  of a large baked potato.  4 oz (113 g) canned or frozen fruit.  4 oz (120 mL) fruit juice.  4-6 crackers.  6 chicken nuggets.  6 oz (170 g) unsweetened dry cereal.  6 oz (170 g) plain fat-free yogurt or yogurt sweetened with artificial sweeteners.  8 oz (240 mL) milk.  8 oz (170 g) fresh fruit or one small piece of fruit.  24 oz (680 g) popped popcorn. Example of carbohydrate counting Sample meal  3 oz (85 g) chicken breast.  6 oz (170 g)   brown rice.  4 oz (113 g) corn.  8 oz (240 mL) milk.  8 oz (170 g) strawberries with sugar-free whipped topping. Carbohydrate calculation 1. Identify the foods that contain carbohydrates: ? Rice. ? Corn. ? Milk. ? Strawberries. 2. Calculate how many servings you have of each food: ? 2 servings rice. ? 1 serving corn. ? 1 serving milk. ? 1 serving strawberries. 3. Multiply each number of servings by 15 g: ? 2 servings rice x 15 g = 30 g. ? 1 serving corn x 15 g = 15 g. ? 1 serving milk x 15 g = 15 g. ? 1  serving strawberries x 15 g = 15 g. 4. Add together all of the amounts to find the total grams of carbohydrates eaten: ? 30 g + 15 g + 15 g + 15 g = 75 g of carbohydrates total. Summary  Carbohydrate counting is a method of keeping track of how many carbohydrates you eat.  Eating carbohydrates naturally increases the amount of sugar (glucose) in the blood.  Counting how many carbohydrates you eat helps keep your blood glucose within normal limits, which helps you manage your diabetes.  A diet and nutrition specialist (registered dietitian) can help you make a meal plan and calculate how many carbohydrates you should have at each meal and snack. This information is not intended to replace advice given to you by your health care provider. Make sure you discuss any questions you have with your health care provider. Document Revised: 09/08/2016 Document Reviewed: 07/29/2015 Elsevier Patient Education  2020 [employer]  

## 2019-05-28 NOTE — Assessment & Plan Note (Signed)
con't healthy weight and wellness 

## 2019-05-28 NOTE — Progress Notes (Signed)
Patient ID: Zachary Michael, male    DOB: [birth year]  Age: 69 y.o. MRN: [id number]    Subjective:  Subjective  HPI Zachary Michael presents for f/u dm, chol and htn.     HYPERTENSION   Blood pressure range- not checking   Chest pain- no      Dyspnea- no Lightheadedness- no   Edema- no  Other side effects - good    Medication compliance: good Low salt diet- yes     DIABETES    Blood Sugar ranges-up to 150  Polyuria- no New Visual problems- no  Hypoglycemic symptoms- no  Other side effects-no Medication compliance - good Last eye exam- due Foot exam- today   HYPERLIPIDEMIA  Medication compliance- good RUQ pain- no  Muscle aches- no Other side effects-no     Review of Systems  Constitutional: Positive for fatigue. Negative for fever.  HENT: Negative for congestion.   Respiratory: Negative for shortness of breath.   Cardiovascular: Negative for chest pain, palpitations and leg swelling.  Gastrointestinal: Negative for abdominal pain, blood in stool and nausea.  Genitourinary: Negative for dysuria and frequency.  Skin: Negative for rash.  Allergic/Immunologic: Negative for environmental allergies.  Neurological: Negative for dizziness and headaches.  Psychiatric/Behavioral: The patient is not nervous/anxious.     History Past Medical History:  Diagnosis Date  . Asthma   . Back pain   . CAD (coronary artery disease)    Mild  . Diabetes mellitus   . Heart valve problem   . HTN (hypertension)   . Hyperlipidemia   . Joint pain   . OSA (obstructive sleep apnea) 08/09/2017  . Palpitations   . SOB (shortness of breath)   . Subluxation    L2-3-4    He has a past surgical history that includes Appendectomy; Tonsillectomy; Tumor removal; Cardiac catheterization; and Cardiovascular stress test.   His family history includes Arthritis in his mother; Cancer in his brother and father; Diabetes in his maternal aunt and mother; Hypertension in his maternal aunt, maternal  aunt, and mother; Lung cancer in his unknown relative; Osteoporosis in his mother; Pneumonia in his mother.He reports that he quit smoking about 41 years ago. His smoking use included pipe. He quit after 7.00 years of use. He has never used smokeless tobacco. He reports current alcohol use. He reports that he does not use drugs.  Current Outpatient Medications on File Prior to Visit  Medication Sig Dispense Refill  . aspirin 81 MG tablet Take 81 mg by mouth daily.      [name] b complex vitamins tablet Take 1 tablet by mouth daily.    . calcium carbonate (CALCIUM 600) 600 MG TABS tablet Take 600 mg by mouth daily with breakfast.    . cholecalciferol (VITAMIN D3) 25 MCG (1000 UT) tablet Take 5,000 Units by mouth daily.    . Cinnamon 500 MG capsule Take 500 mg by mouth daily.    [name] co-enzyme Q-10 30 MG capsule Take 30 mg by mouth 3 (three) times daily.    . fenofibrate 160 MG tablet Take 1 tablet (160 mg total) by mouth daily. 30 tablet 0  . ferrous sulfate 325 (65 FE) MG EC tablet Take 325 mg by mouth 3 (three) times daily with meals.    [name] glucose blood (ONETOUCH VERIO) test strip Use as instructed 100 each 0  . metFORMIN (GLUCOPHAGE) 1000 MG tablet TAKE 1 TABLET (1,000 MG TOTAL) BY MOUTH 2 (TWO) TIMES DAILY WITH A MEAL. 180 tablet  1  . Multiple Vitamin (MULTIVITAMIN) capsule Take 1 capsule by mouth daily.    . nitroGLYCERIN (NITROSTAT) 0.4 MG SL tablet Place 1 tablet (0.4 mg total) under the tongue every 5 (five) minutes as needed for chest pain. 25 tablet 5  . simvastatin (ZOCOR) 40 MG tablet TAKE 1 TABLET BY MOUTH EVERYDAY AT BEDTIME 30 tablet 0  . Zinc 100 MG TABS Take 1 tablet by mouth daily.     No current facility-administered medications on file prior to visit.     Objective:  Objective  Physical Exam Vitals and nursing note reviewed.  Constitutional:      General: He is sleeping.     Appearance: He is well-developed.  HENT:     Head: Normocephalic and atraumatic.  Eyes:     Pupils:  Pupils are equal, round, and reactive to light.  Neck:     Thyroid: No thyromegaly.  Cardiovascular:     Rate and Rhythm: Normal rate and regular rhythm.     Heart sounds: No murmur.  Pulmonary:     Effort: Pulmonary effort is normal. No respiratory distress.     Breath sounds: Normal breath sounds. No wheezing or rales.  Chest:     Chest wall: No tenderness.  Musculoskeletal:        General: No tenderness.     Cervical back: Normal range of motion and neck supple.  Skin:    General: Skin is warm and dry.  Neurological:     Mental Status: He is oriented to person, place, and time.  Psychiatric:        Behavior: Behavior normal.        Thought Content: Thought content normal.        Judgment: Judgment normal.    Diabetic Foot Exam - Simple   Simple Foot Form Diabetic Foot exam was performed with the following findings: Yes 05/28/2019  1:42 PM  Visual Inspection No deformities, no ulcerations, no other skin breakdown bilaterally: Yes Sensation Testing Intact to touch and monofilament testing bilaterally: Yes Pulse Check Posterior Tibialis and Dorsalis pulse intact bilaterally: Yes Comments     BP (!) 110/58 (BP Location: Right Arm, Patient Position: Sitting, Cuff Size: Normal)   Pulse (!) 52   Temp (!) 97 F (36.1 C) (Temporal)   Resp 18   Ht 5\' 9"  (1.753 m)   Wt 238 lb (108 kg)   SpO2 98%   BMI 35.15 kg/m  Wt Readings from Last 3 Encounters:  05/28/19 238 lb (108 kg)  05/09/19 232 lb (105.2 kg)  04/16/19 234 lb (106.1 kg)     Lab Results  Component Value Date   WBC 6.3 03/07/2019   HGB 17.1 03/07/2019   HCT 49.0 03/07/2019   PLT 207 03/07/2019   GLUCOSE 150 (H) 03/21/2019   CHOL 149 03/21/2019   TRIG 60 03/21/2019   HDL 63 03/21/2019   LDLDIRECT 198.6 03/20/2009   LDLCALC 74 03/21/2019   ALT 14 03/21/2019   AST 16 03/21/2019   NA 141 03/21/2019   K 4.8 03/21/2019   CL 101 03/21/2019   CREATININE 0.85 03/21/2019   BUN 21 03/21/2019   CO2 24  03/21/2019   TSH 1.510 03/07/2019   PSA 3.68 11/07/2016   INR 1.0 06/02/2008   HGBA1C 7.1 (H) 03/07/2019   MICROALBUR 1.2 11/06/2014    DG Chest 2 View  Result Date: 11/06/2014 CLINICAL DATA:  Cough, wheezing for hours EXAM: CHEST  2 VIEW COMPARISON:  06/02/2008 FINDINGS:  The heart size and mediastinal contours are within normal limits. Both lungs are clear. The visualized skeletal structures are unremarkable. IMPRESSION: No active cardiopulmonary disease. Electronically Signed   By: Elige Ko   On: 11/06/2014 15:11     Assessment & Plan:  Plan  I have discontinued Celesta Gentile. Radloff's carvedilol. I have also changed his lisinopril. Additionally, I am having him start on carvedilol. Lastly, I am having him maintain his aspirin, nitroGLYCERIN, multivitamin, calcium carbonate, ferrous sulfate, b complex vitamins, co-enzyme Q-10, Cinnamon, Zinc, glucose blood, cholecalciferol, metFORMIN, simvastatin, and fenofibrate.  Meds ordered this encounter  Medications  . lisinopril (ZESTRIL) 10 MG tablet    Sig: 1/2 tab Michael qd    Dispense:  45 tablet    Refill:  0  . carvedilol (COREG) 3.125 MG tablet    Sig: Take 1 tablet (3.125 mg total) by mouth 2 (two) times daily with a meal.    Dispense:  60 tablet    Refill:  3    Problem List Items Addressed This Visit      Unprioritized   Asthma    Stable        CAD (coronary artery disease)    Check labs con't meds      Relevant Medications   lisinopril (ZESTRIL) 10 MG tablet   carvedilol (COREG) 3.125 MG tablet   Class 1 obesity with serious comorbidity and body mass index (BMI) of 34.0 to 34.9 in adult    con't healthy weight and wellness      Hyperlipidemia    Tolerating statin, encouraged heart healthy diet, avoid trans fats, minimize simple carbs and saturated fats. Increase exercise as tolerated      Relevant Medications   lisinopril (ZESTRIL) 10 MG tablet   carvedilol (COREG) 3.125 MG tablet   Hypertension    Well  controlled, no changes to meds. Encouraged heart healthy diet such as the DASH diet and exercise as tolerated.        Relevant Medications   lisinopril (ZESTRIL) 10 MG tablet   carvedilol (COREG) 3.125 MG tablet   Type 2 diabetes mellitus without complication, without long-term current use of insulin (HCC)    hgba1c to be checked, minimize simple carbs. Increase exercise as tolerated. Continue current meds       Relevant Medications   lisinopril (ZESTRIL) 10 MG tablet    Other Visit Diagnoses    Vitamin D deficiency    -  Primary   Relevant Orders   VITAMIN D 25 Hydroxy (Vit-D Deficiency, Fractures)   Morbid obesity (HCC)       Relevant Orders   Insulin, random   Type 2 diabetes mellitus with hyperglycemia, without long-term current use of insulin (HCC)       Relevant Medications   lisinopril (ZESTRIL) 10 MG tablet   Other Relevant Orders   Hemoglobin A1c   Comprehensive metabolic panel   Hyperlipidemia associated with type 2 diabetes mellitus (HCC)       Relevant Medications   lisinopril (ZESTRIL) 10 MG tablet   Other Relevant Orders   Lipid panel   Essential (primary) hypertension       Relevant Medications   lisinopril (ZESTRIL) 10 MG tablet   carvedilol (COREG) 3.125 MG tablet   Colon cancer screening       Relevant Orders   Ambulatory referral to Gastroenterology      Follow-up: Return in about 6 months (around 11/28/2019) for annual exam, fasting.  Donato Schultz, DO

## 2019-05-28 NOTE — Assessment & Plan Note (Signed)
Stable

## 2019-05-28 NOTE — Assessment & Plan Note (Signed)
hgba1c to be checked, minimize simple carbs. Increase exercise as tolerated. Continue current meds  

## 2019-05-28 NOTE — Assessment & Plan Note (Signed)
Well controlled, no changes to meds. Encouraged heart healthy diet such as the DASH diet and exercise as tolerated.  °

## 2019-05-28 NOTE — Assessment & Plan Note (Signed)
Tolerating statin, encouraged heart healthy diet, avoid trans fats, minimize simple carbs and saturated fats. Increase exercise as tolerated 

## 2019-05-28 NOTE — Assessment & Plan Note (Signed)
Check labs con't meds 

## 2019-05-29 LAB — INSULIN, RANDOM: Insulin: 2.4 u[IU]/mL

## 2019-05-30 ENCOUNTER — Other Ambulatory Visit: Payer: Self-pay

## 2019-05-30 ENCOUNTER — Ambulatory Visit (INDEPENDENT_AMBULATORY_CARE_PROVIDER_SITE_OTHER): Payer: Medicare Other | Admitting: Family Medicine

## 2019-05-30 ENCOUNTER — Encounter (INDEPENDENT_AMBULATORY_CARE_PROVIDER_SITE_OTHER): Payer: Self-pay | Admitting: Family Medicine

## 2019-05-30 VITALS — BP 94/56 | HR 57 | Temp 97.5°F | Ht 69.0 in | Wt 231.0 lb

## 2019-05-30 DIAGNOSIS — E559 Vitamin D deficiency, unspecified: Secondary | ICD-10-CM

## 2019-05-30 DIAGNOSIS — E669 Obesity, unspecified: Secondary | ICD-10-CM

## 2019-05-30 DIAGNOSIS — I2510 Atherosclerotic heart disease of native coronary artery without angina pectoris: Secondary | ICD-10-CM

## 2019-05-30 DIAGNOSIS — E1165 Type 2 diabetes mellitus with hyperglycemia: Secondary | ICD-10-CM

## 2019-05-30 DIAGNOSIS — Z6834 Body mass index (BMI) 34.0-34.9, adult: Secondary | ICD-10-CM

## 2019-05-30 DIAGNOSIS — I10 Essential (primary) hypertension: Secondary | ICD-10-CM

## 2019-05-30 MED ORDER — CARVEDILOL 3.125 MG PO TABS: 3.1250 mg | ORAL_TABLET | Freq: Every day | ORAL | 0 refills | Status: DC

## 2019-05-30 NOTE — Progress Notes (Signed)
Chief Complaint:   OBESITY Zachary Michael is here to discuss his progress with his obesity treatment plan along with follow-up of his obesity related diagnoses. Zachary Michael is on keeping a food journal and adhering to recommended goals of 1400-1450 calories and 90+ grams of protein and states he is following his eating plan approximately 80% of the time. Zachary Michael states he is walking the dog 30-50 minutes 7 times per week.  Today's visit was #: 28 Starting weight: 301 lbs Starting date: 01/24/2018 Today's weight: 231 lbs Today's date: 05/30/2019 Total lbs lost to date: 70 lbs Total lbs lost since last in-office visit: 1 lb  Interim History: Zachary Michael voices that there is underlying stress with his daughters at home.  He has been indulging in his wife's baking to increase calories because he is hitting his protein goal daily.  He has been keeping up with journaling and is occasionally below and occasionally above calorie goal.  Subjective:   1. Essential (primary) hypertension Review: taking medications as instructed, no medication side effects noted, no chest pain on exertion, no dyspnea on exertion, no swelling of ankles. Blood pressure is low today and heart rate is low as well.  Coreg dose decreased to half and still on lisinopril 5 mg.  BP Readings from Last 3 Encounters:  05/30/19 (!) 94/56  05/28/19 (!) 110/58  05/09/19 130/63   2. Vitamin D deficiency Zachary Michael's Vitamin D level was 93.53 on 05/28/2019. He denies nausea, vomiting or muscle weakness.  Positive for fatigue.  3. Type 2 diabetes mellitus with hyperglycemia, without long-term current use of insulin (HCC) No hypoglycemia.  Blood sugars haven't been below 120.  Highest blood sugar has been 160.  Lab Results  Component Value Date   HGBA1C 6.8 (H) 05/28/2019   HGBA1C 7.1 (H) 03/07/2019   HGBA1C 6.5 (H) 10/04/2018   Lab Results  Component Value Date   MICROALBUR 1.2 11/06/2014   LDLCALC 58 05/28/2019   CREATININE 0.69  05/28/2019   Lab Results  Component Value Date   INSULIN 6.3 03/07/2019   INSULIN 2.5 (L) 10/04/2018   INSULIN 4.3 05/07/2018   INSULIN 7.9 01/24/2018   Assessment/Plan:   1. Essential (primary) hypertension Zachary Michael is working on healthy weight loss and exercise to improve blood pressure control. We will watch for signs of hypotension as he continues his lifestyle modifications. - carvedilol (COREG) 3.125 MG tablet; Take 1 tablet (3.125 mg total) by mouth daily. 1/2 tablet twice a day (Patient taking differently: Take 3.125 mg by mouth daily. )  Dispense: 30 tablet; Refill: 0  2. Vitamin D deficiency Stop vitamin D.  3. Type 2 diabetes mellitus with hyperglycemia, without long-term current use of insulin (HCC) Good blood sugar control is important to decrease the likelihood of diabetic complications such as nephropathy, neuropathy, limb loss, blindness, coronary artery disease, and death. Intensive lifestyle modification including diet, exercise and weight loss are the first line of treatment for diabetes.   4. Class 1 obesity with serious comorbidity and body mass index (BMI) of 34.0 to 34.9 in adult, unspecified obesity type Zachary Michael is currently in the action stage of change. As such, his goal is to continue with weight loss efforts. He has agreed to keeping a food journal and adhering to recommended goals of 1400-1500 calories and 90+ grams of protein.   Exercise goals: As is.  Behavioral modification strategies: increasing lean protein intake, meal planning and cooking strategies, keeping healthy foods in the home and keeping a strict food  journal.  Zachary Michael has agreed to follow-up with our clinic in 4 weeks. He was informed of the importance of frequent follow-up visits to maximize his success with intensive lifestyle modifications for his multiple health conditions.   Objective:   Blood pressure (!) 94/56, pulse (!) 57, temperature (!) 97.5 F (36.4 C), temperature source Oral,  height 5\' 9"  (1.753 m), weight 231 lb (104.8 kg), SpO2 97 %. Body mass index is 34.11 kg/m.  General: Cooperative, alert, well developed, in no acute distress. HEENT: Conjunctivae and lids unremarkable. Cardiovascular: Regular rhythm.  Lungs: Normal work of breathing. Neurologic: No focal deficits.   Lab Results  Component Value Date   CREATININE 0.69 05/28/2019   BUN 18 05/28/2019   NA 137 05/28/2019   K 4.2 05/28/2019   CL 103 05/28/2019   CO2 27 05/28/2019   Lab Results  Component Value Date   ALT 11 05/28/2019   AST 14 05/28/2019   ALKPHOS 35 (L) 05/28/2019   BILITOT 0.6 05/28/2019   Lab Results  Component Value Date   HGBA1C 6.8 (H) 05/28/2019   HGBA1C 7.1 (H) 03/07/2019   HGBA1C 6.5 (H) 10/04/2018   HGBA1C 6.4 (H) 05/07/2018   HGBA1C 6.9 (H) 11/13/2017   Lab Results  Component Value Date   INSULIN 6.3 03/07/2019   INSULIN 2.5 (L) 10/04/2018   INSULIN 4.3 05/07/2018   INSULIN 7.9 01/24/2018   Lab Results  Component Value Date   TSH 1.510 03/07/2019   Lab Results  Component Value Date   CHOL 124 05/28/2019   HDL 53.40 05/28/2019   LDLCALC 58 05/28/2019   LDLDIRECT 198.6 03/20/2009   TRIG 60.0 05/28/2019   CHOLHDL 2 05/28/2019   Lab Results  Component Value Date   WBC 6.3 03/07/2019   HGB 17.1 03/07/2019   HCT 49.0 03/07/2019   MCV 88 03/07/2019   PLT 207 03/07/2019   Lab Results  Component Value Date   IRON 82 03/07/2019   TIBC 375 03/07/2019   FERRITIN 161 03/07/2019    Obesity Behavioral Intervention Documentation for Insurance:   Approximately 15 minutes were spent on the discussion below.  ASK: We discussed the diagnosis of obesity with Zachary Michael today and Zachary Michael agreed to give [name] permission to discuss obesity behavioral modification therapy today.  ASSESS: Zachary Michael has the diagnosis of obesity and his BMI today is 34.1. Zachary Michael is in the action stage of change.   ADVISE: Zachary Michael was educated on the multiple health risks of obesity  as well as the benefit of weight loss to improve his health. He was advised of the need for long term treatment and the importance of lifestyle modifications to improve his current health and to decrease his risk of future health problems.  AGREE: Multiple dietary modification options and treatment options were discussed and Ira agreed to follow the recommendations documented in the above note.  ARRANGE: Rhyder was educated on the importance of frequent visits to treat obesity as outlined per CMS and USPSTF guidelines and agreed to schedule his next follow up appointment today.  Attestation Statements:   Reviewed by clinician on day of visit: allergies, medications, problem list, medical history, surgical history, family history, social history, and previous encounter notes.  I, Water quality scientist, CMA, am acting as transcriptionist for Coralie Common, MD.  I have reviewed the above documentation for accuracy and completeness, and I agree with the above. - Ilene Qua, MD

## 2019-05-31 ENCOUNTER — Encounter: Payer: Self-pay | Admitting: Family Medicine

## 2019-06-02 ENCOUNTER — Other Ambulatory Visit (INDEPENDENT_AMBULATORY_CARE_PROVIDER_SITE_OTHER): Payer: Self-pay | Admitting: Family Medicine

## 2019-06-02 DIAGNOSIS — E7849 Other hyperlipidemia: Secondary | ICD-10-CM

## 2019-06-03 NOTE — Telephone Encounter (Signed)
F/u office bp check next week

## 2019-06-27 ENCOUNTER — Other Ambulatory Visit: Payer: Self-pay

## 2019-06-27 ENCOUNTER — Ambulatory Visit (INDEPENDENT_AMBULATORY_CARE_PROVIDER_SITE_OTHER): Payer: Medicare Other | Admitting: Family Medicine

## 2019-06-27 ENCOUNTER — Encounter (INDEPENDENT_AMBULATORY_CARE_PROVIDER_SITE_OTHER): Payer: Self-pay | Admitting: Family Medicine

## 2019-06-27 VITALS — BP 110/65 | HR 67 | Temp 97.8°F | Ht 69.0 in | Wt 229.0 lb

## 2019-06-27 DIAGNOSIS — I2510 Atherosclerotic heart disease of native coronary artery without angina pectoris: Secondary | ICD-10-CM | POA: Diagnosis not present

## 2019-06-27 DIAGNOSIS — E669 Obesity, unspecified: Secondary | ICD-10-CM | POA: Diagnosis not present

## 2019-06-27 DIAGNOSIS — E7849 Other hyperlipidemia: Secondary | ICD-10-CM

## 2019-06-27 DIAGNOSIS — Z6833 Body mass index (BMI) 33.0-33.9, adult: Secondary | ICD-10-CM | POA: Diagnosis not present

## 2019-06-27 DIAGNOSIS — I10 Essential (primary) hypertension: Secondary | ICD-10-CM

## 2019-06-27 MED ORDER — FENOFIBRATE 160 MG PO TABS: 160.0000 mg | ORAL_TABLET | Freq: Every day | ORAL | 0 refills | Status: DC

## 2019-06-27 MED ORDER — SIMVASTATIN 40 MG PO TABS: 40.0000 mg | ORAL_TABLET | Freq: Every day | ORAL | 0 refills | Status: DC

## 2019-06-27 NOTE — Progress Notes (Signed)
Chief Complaint:   Zachary Michael is here to discuss his progress with his Zachary treatment plan along with follow-up of his Zachary related diagnoses. Zachary Michael is keeping a food journal and adhering to recommended goals of 1400-1500 calories and 90+ grams of protein and states he is following his eating plan approximately 60-65% of the time. Zachary Michael states he is "marching" the dog and doing yard work 30-60 minutes 7 times per week.  Today's visit was #: 22 Starting weight: 301 lbs Starting date: 01/24/2018 Today's weight: 229 lbs Today's date: 06/27/2019 Total lbs lost to date: 72 Total lbs lost since last in-office visit: 2  Interim History: Zachary Michael voices the last few weeks have been stressful with his daughter's search for a car. For a few days he has gone ~10 hours without eating. He reports waves of skipping meals and then having way too many calories or too few calories.  Subjective:   Other hyperlipidemia. Zachary Michael is on simvastatin and fenofibrate.   Lab Results  Component Value Date   CHOL 124 05/28/2019   HDL 53.40 05/28/2019   LDLCALC 58 05/28/2019   LDLDIRECT 198.6 03/20/2009   TRIG 60.0 05/28/2019   CHOLHDL 2 05/28/2019   Lab Results  Component Value Date   ALT 11 05/28/2019   AST 14 05/28/2019   ALKPHOS 35 (L) 05/28/2019   BILITOT 0.6 05/28/2019   The ASCVD Risk score Zachary Bussing DC Jr., et al., 2013) failed to calculate for the following reasons:   The valid total cholesterol range is 130 to 320 mg/dL  Essential hypertension. No dizziness or lightheadedness.  BP Readings from Last 3 Encounters:  06/27/19 110/65  05/30/19 (!) 94/56  05/28/19 (!) 110/58   Lab Results  Component Value Date   CREATININE 0.69 05/28/2019   CREATININE 0.85 03/21/2019   CREATININE 0.81 03/07/2019   Assessment/Plan:   Other hyperlipidemia. Cardiovascular risk and specific lipid/LDL goals reviewed.  We discussed several lifestyle modifications today and Zachary Michael will  continue to work on diet, exercise and weight loss efforts. Orders and follow up as documented in patient record. Refills were given for simvastatin (ZOCOR) 40 MG tablet PO daily #90 with 0 refills and fenofibrate 160 MG tablet PO daily #90 with 0 refills.  Counseling Intensive lifestyle modifications are the first line treatment for this issue. . Dietary changes: Increase soluble fiber. Decrease simple carbohydrates. . Exercise changes: Moderate to vigorous-intensity aerobic activity 150 minutes per week if tolerated. . Lipid-lowering medications: see documented in medical record.   Essential hypertension. Zachary Michael is working on healthy weight loss and exercise to improve blood pressure control. We will watch for signs of hypotension as he continues his lifestyle modifications. Will have follow-up blood pressure at his next appointment; if blood pressure is the same at his next appointment, will stop lisinopril.  Class 1 Zachary with serious comorbidity and body mass index (BMI) of 33.0 to 33.9 in adult, unspecified Zachary type.  Zachary Michael is currently in the action stage of change. As such, his goal is to continue with weight loss efforts. He has agreed to keeping a food journal and adhering to recommended goals of 1400-1500 calories and 90+ grams of protein daily.   Exercise goals: Older adults should follow the adult guidelines. When older adults cannot meet the adult guidelines, they should be as physically active as their abilities and conditions will allow.   Behavioral modification strategies: increasing lean protein intake, meal planning and cooking strategies and keeping a strict food  journal.  Zachary Michael has agreed to follow-up with our clinic in 4 weeks. He was informed of the importance of frequent follow-up visits to maximize his success with intensive lifestyle modifications for his multiple health conditions.   Objective:   Blood pressure 110/65, pulse 67, temperature 97.8 F (36.6  C), temperature source Oral, height 5\' 9"  (1.753 m), weight 229 lb (103.9 kg), SpO2 97 %. Body mass index is 33.82 kg/m.  Zachary Michael: Cooperative, alert, well developed, in no acute distress. HEENT: Conjunctivae and lids unremarkable. Cardiovascular: Regular rhythm.  Lungs: Normal work of breathing. Neurologic: No focal deficits.   Lab Results  Component Value Date   CREATININE 0.69 05/28/2019   BUN 18 05/28/2019   NA 137 05/28/2019   K 4.2 05/28/2019   CL 103 05/28/2019   CO2 27 05/28/2019   Lab Results  Component Value Date   ALT 11 05/28/2019   AST 14 05/28/2019   ALKPHOS 35 (L) 05/28/2019   BILITOT 0.6 05/28/2019   Lab Results  Component Value Date   HGBA1C 6.8 (H) 05/28/2019   HGBA1C 7.1 (H) 03/07/2019   HGBA1C 6.5 (H) 10/04/2018   HGBA1C 6.4 (H) 05/07/2018   HGBA1C 6.9 (H) 11/13/2017   Lab Results  Component Value Date   INSULIN 6.3 03/07/2019   INSULIN 2.5 (L) 10/04/2018   INSULIN 4.3 05/07/2018   INSULIN 7.9 01/24/2018   Lab Results  Component Value Date   TSH 1.510 03/07/2019   Lab Results  Component Value Date   CHOL 124 05/28/2019   HDL 53.40 05/28/2019   LDLCALC 58 05/28/2019   LDLDIRECT 198.6 03/20/2009   TRIG 60.0 05/28/2019   CHOLHDL 2 05/28/2019   Lab Results  Component Value Date   WBC 6.3 03/07/2019   HGB 17.1 03/07/2019   HCT 49.0 03/07/2019   MCV 88 03/07/2019   PLT 207 03/07/2019   Lab Results  Component Value Date   IRON 82 03/07/2019   TIBC 375 03/07/2019   FERRITIN 161 03/07/2019   Zachary Behavioral Intervention Documentation for Insurance:   Approximately 15 minutes were spent on the discussion below.  ASK: We discussed the diagnosis of Zachary with 05/05/2019 today and Zachary Michael agreed to give Zachary Michael permission to discuss Zachary behavioral modification therapy today.  ASSESS: Zachary Michael has the diagnosis of Zachary and his BMI today is 33.8. Zachary Michael is in the action stage of change.   ADVISE: Zachary Michael was educated on the  multiple health risks of Zachary as well as the benefit of weight loss to improve his health. He was advised of the need for long term treatment and the importance of lifestyle modifications to improve his current health and to decrease his risk of future health problems.  AGREE: Multiple dietary modification options and treatment options were discussed and Zachary Michael agreed to follow the recommendations documented in the above note.  ARRANGE: Zachary Michael was educated on the importance of frequent visits to treat Zachary as outlined per CMS and USPSTF guidelines and agreed to schedule his next follow up appointment today.  Attestation Statements:   Reviewed by clinician on day of visit: allergies, medications, problem list, medical history, surgical history, family history, social history, and previous encounter notes.  I, Zachary Michael, am acting as transcriptionist for Marianna Payment, MD   I have reviewed the above documentation for accuracy and completeness, and I agree with the above. - Reuben Likes, MD

## 2019-07-10 ENCOUNTER — Encounter: Payer: Self-pay | Admitting: Family Medicine

## 2019-07-25 ENCOUNTER — Encounter (INDEPENDENT_AMBULATORY_CARE_PROVIDER_SITE_OTHER): Payer: Self-pay | Admitting: Family Medicine

## 2019-07-25 ENCOUNTER — Other Ambulatory Visit: Payer: Self-pay

## 2019-07-25 ENCOUNTER — Ambulatory Visit (INDEPENDENT_AMBULATORY_CARE_PROVIDER_SITE_OTHER): Payer: Medicare Other | Admitting: Family Medicine

## 2019-07-25 VITALS — BP 92/54 | HR 69 | Temp 97.9°F | Ht 69.0 in | Wt 223.0 lb

## 2019-07-25 DIAGNOSIS — I2510 Atherosclerotic heart disease of native coronary artery without angina pectoris: Secondary | ICD-10-CM

## 2019-07-25 DIAGNOSIS — Z6833 Body mass index (BMI) 33.0-33.9, adult: Secondary | ICD-10-CM

## 2019-07-25 DIAGNOSIS — E1165 Type 2 diabetes mellitus with hyperglycemia: Secondary | ICD-10-CM

## 2019-07-25 DIAGNOSIS — E669 Obesity, unspecified: Secondary | ICD-10-CM

## 2019-07-25 DIAGNOSIS — I10 Essential (primary) hypertension: Secondary | ICD-10-CM

## 2019-07-25 NOTE — Progress Notes (Signed)
Chief Complaint:   OBESITY Zachary Michael is here to discuss his progress with his obesity treatment plan along with follow-up of his obesity related diagnoses. Zachary Michael is on keeping a food journal and adhering to recommended goals of 1400-1500 calories and 90 grams of protein and states he is following his eating plan approximately 85-90% of the time. Zachary Michael states he is yard work for 45 minutes 7 times per week and walking for 30 minutes 7 times per week.  Today's visit was #: 30 Starting weight: 301 lbs Starting date: 01/24/2018 Today's weight: 223 lbs Today's date: 07/25/2019 Total lbs lost to date: 78 lbs Total lbs lost since last in-office visit: 6 lbs  Interim History: Zachary Michael has continued journaling over the last few weeks.  He has been fairly on point with getting calories and protein.  He is averaging 1350-1550 calories and 90-120 grams of protein.  Denies hunger.  No significant stress, so no impact on eating.  Subjective:   1. Essential hypertension Review: taking medications as instructed, no medication side effects noted, no chest pain on exertion, no dyspnea on exertion, no swelling of ankles.  Blood pressure is low today.  No dizziness or lightheadedness at same frequency as before.  Still some lightheadedness with bending over.  BP Readings from Last 3 Encounters:  07/25/19 (!) 92/54  06/27/19 110/65  05/30/19 (!) 94/56   2. Type 2 diabetes mellitus with hyperglycemia, without long-term current use of insulin (HCC) Medications reviewed. Diabetic ROS: no polyuria or polydipsia, no chest pain, dyspnea or TIA's, no numbness, tingling or pain in extremities.  He is taking metformin 1000 mg twice daily with no GI side effects.  Lab Results  Component Value Date   HGBA1C 6.8 (H) 05/28/2019   HGBA1C 7.1 (H) 03/07/2019   HGBA1C 6.5 (H) 10/04/2018   Lab Results  Component Value Date   MICROALBUR 1.2 11/06/2014   LDLCALC 58 05/28/2019   CREATININE 0.69 05/28/2019    Lab Results  Component Value Date   INSULIN 6.3 03/07/2019   INSULIN 2.5 (L) 10/04/2018   INSULIN 4.3 05/07/2018   INSULIN 7.9 01/24/2018   Assessment/Plan:   1. Essential hypertension Alistar is working on healthy weight loss and exercise to improve blood pressure control. We will watch for signs of hypotension as he continues his lifestyle modifications.  Stop lisinopril and follow-up blood pressure at next appointment.  2. Type 2 diabetes mellitus with hyperglycemia, without long-term current use of insulin (HCC) Good blood sugar control is important to decrease the likelihood of diabetic complications such as nephropathy, neuropathy, limb loss, blindness, coronary artery disease, and death. Intensive lifestyle modification including diet, exercise and weight loss are the first line of treatment for diabetes.   3. Class 1 obesity with serious comorbidity and body mass index (BMI) of 33.0 to 33.9 in adult, unspecified obesity type Zachary Michael is currently in the action stage of change. As such, his goal is to continue with weight loss efforts. He has agreed to keeping a food journal and adhering to recommended goals of 1400-1500 calories and 90+ grams of protein.   Exercise goals: As is.  Behavioral modification strategies: increasing lean protein intake, meal planning and cooking strategies, keeping healthy foods in the home and planning for success.  Tandre has agreed to follow-up with our clinic in 4 weeks. He was informed of the importance of frequent follow-up visits to maximize his success with intensive lifestyle modifications for his multiple health conditions.   Objective:  Blood pressure (!) 92/54, pulse 69, temperature 97.9 F (36.6 C), temperature source Oral, height 5\' 9"  (1.753 m), weight 223 lb (101.2 kg), SpO2 96 %. Body mass index is 32.93 kg/m.  General: Cooperative, alert, well developed, in no acute distress. HEENT: Conjunctivae and lids  unremarkable. Cardiovascular: Regular rhythm.  Lungs: Normal work of breathing. Neurologic: No focal deficits.   Lab Results  Component Value Date   CREATININE 0.69 05/28/2019   BUN 18 05/28/2019   NA 137 05/28/2019   K 4.2 05/28/2019   CL 103 05/28/2019   CO2 27 05/28/2019   Lab Results  Component Value Date   ALT 11 05/28/2019   AST 14 05/28/2019   ALKPHOS 35 (L) 05/28/2019   BILITOT 0.6 05/28/2019   Lab Results  Component Value Date   HGBA1C 6.8 (H) 05/28/2019   HGBA1C 7.1 (H) 03/07/2019   HGBA1C 6.5 (H) 10/04/2018   HGBA1C 6.4 (H) 05/07/2018   HGBA1C 6.9 (H) 11/13/2017   Lab Results  Component Value Date   INSULIN 6.3 03/07/2019   INSULIN 2.5 (L) 10/04/2018   INSULIN 4.3 05/07/2018   INSULIN 7.9 01/24/2018   Lab Results  Component Value Date   TSH 1.510 03/07/2019   Lab Results  Component Value Date   CHOL 124 05/28/2019   HDL 53.40 05/28/2019   LDLCALC 58 05/28/2019   LDLDIRECT 198.6 03/20/2009   TRIG 60.0 05/28/2019   CHOLHDL 2 05/28/2019   Lab Results  Component Value Date   WBC 6.3 03/07/2019   HGB 17.1 03/07/2019   HCT 49.0 03/07/2019   MCV 88 03/07/2019   PLT 207 03/07/2019   Lab Results  Component Value Date   IRON 82 03/07/2019   TIBC 375 03/07/2019   FERRITIN 161 03/07/2019   Obesity Behavioral Intervention Documentation for Insurance:   Approximately 15 minutes were spent on the discussion below.  ASK: We discussed the diagnosis of obesity with 05/05/2019 today and Byan agreed to give [name] permission to discuss obesity behavioral modification therapy today.  ASSESS: Jaece has the diagnosis of obesity and his BMI today is 33.0. Donte is in the action stage of change.   ADVISE: Chrystian was educated on the multiple health risks of obesity as well as the benefit of weight loss to improve his health. He was advised of the need for long term treatment and the importance of lifestyle modifications to improve his current health and to  decrease his risk of future health problems.  AGREE: Multiple dietary modification options and treatment options were discussed and Chrisangel agreed to follow the recommendations documented in the above note.  ARRANGE: Ashar was educated on the importance of frequent visits to treat obesity as outlined per CMS and USPSTF guidelines and agreed to schedule his next follow up appointment today.  Attestation Statements:   Reviewed by clinician on day of visit: allergies, medications, problem list, medical history, surgical history, family history, social history, and previous encounter notes.  Time spent on visit including pre-visit chart review and post-visit care and charting was 20 minutes.   I, [name], CMA, am acting as transcriptionist for Insurance claims handler, MD.  I have reviewed the above documentation for accuracy and completeness, and I agree with the above. - Reuben Likes, MD

## 2019-08-22 ENCOUNTER — Ambulatory Visit (INDEPENDENT_AMBULATORY_CARE_PROVIDER_SITE_OTHER): Payer: Medicare Other | Admitting: Family Medicine

## 2019-08-22 ENCOUNTER — Other Ambulatory Visit: Payer: Self-pay

## 2019-08-22 ENCOUNTER — Encounter (INDEPENDENT_AMBULATORY_CARE_PROVIDER_SITE_OTHER): Payer: Self-pay | Admitting: Family Medicine

## 2019-08-22 VITALS — BP 144/65 | HR 57 | Temp 97.5°F | Ht 69.0 in | Wt 223.0 lb

## 2019-08-22 DIAGNOSIS — E669 Obesity, unspecified: Secondary | ICD-10-CM

## 2019-08-22 DIAGNOSIS — Z6833 Body mass index (BMI) 33.0-33.9, adult: Secondary | ICD-10-CM | POA: Diagnosis not present

## 2019-08-22 DIAGNOSIS — I10 Essential (primary) hypertension: Secondary | ICD-10-CM

## 2019-08-22 DIAGNOSIS — I2510 Atherosclerotic heart disease of native coronary artery without angina pectoris: Secondary | ICD-10-CM

## 2019-08-22 DIAGNOSIS — R0602 Shortness of breath: Secondary | ICD-10-CM

## 2019-08-26 NOTE — Progress Notes (Signed)
Chief Complaint:   OBESITY Zachary Michael is here to discuss his progress with his obesity treatment plan along with follow-up of his obesity related diagnoses. Zachary Michael is on keeping a food journal and adhering to recommended goals of 1400-1500 calories and 90+ grams of protein daily and states he is following his eating plan approximately 95% of the time. Zachary Michael states he is walking the dog for 30 minutes 7 times per week.  Today's visit was #: 31 Starting weight: 301 lbs Starting date: 01/24/2018 Today's weight: 223 lbs Today's date: 08/22/2019 Total lbs lost to date: 78 Total lbs lost since last in-office visit: 0  Interim History: Zachary Michael has been doing 1400-1500 calories per day easily and usually >90 grams of protein per day (never less than 80 grams). He has been getting in more protein the last few weeks than previously. 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  Subjective:   1. SOB (shortness of breath) on exertion Zachary Michael notes his shortness of breath is unchanged from previously. Last IC was 1875.  2. Essential hypertension Zachary Michael blood pressure is slightly elevated today. He denies chest pain, chest pressure, or headache. He has not had dizziness or lightheadedness as he previously did.  Assessment/Plan:   1. SOB (shortness of breath) on exertion We will recheck IC today. Zachary Michael will follow up as directed.  2. Essential hypertension Zachary Michael will continue his current medications, no change in dose. He will continue working on healthy weight loss and exercise to improve blood pressure control. We will watch for signs of hypotension as he continues his lifestyle modifications.  3. Class 1 obesity with serious comorbidity and body mass index (BMI) of 33.0 to 33.9 in adult, unspecified obesity type Zachary Michael is currently in the action stage of change. As such, his goal is to continue with weight loss efforts. He has agreed to keeping a food journal and  adhering to recommended goals of 1400-1500 calories and 85+ grams of protein daily.   Exercise goals: As is, resistance training for 10-15 minutes 2 times per week.  Behavioral modification strategies: increasing lean protein intake, increasing vegetables, planning for success and keeping a strict food journal.  Zachary Michael has agreed to follow-up with our clinic in 4 weeks. He was informed of the importance of frequent follow-up visits to maximize his success with intensive lifestyle modifications for his multiple health conditions.   Objective:   Blood pressure (!) 144/65, pulse (!) 57, temperature (!) 97.5 F (36.4 C), temperature source Oral, height 5\' 9"  (1.753 m), weight 223 lb (101.2 kg), SpO2 98 %. Body mass index is 32.93 kg/m.  General: Cooperative, alert, well developed, in no acute distress. HEENT: Conjunctivae and lids unremarkable. Cardiovascular: Regular rhythm.  Lungs: Normal work of breathing. Neurologic: No focal deficits.   Lab Results  Component Value Date   CREATININE 0.69 05/28/2019   BUN 18 05/28/2019   NA 137 05/28/2019   K 4.2 05/28/2019   CL 103 05/28/2019   CO2 27 05/28/2019   Lab Results  Component Value Date   ALT 11 05/28/2019   AST 14 05/28/2019   ALKPHOS 35 (L) 05/28/2019   BILITOT 0.6 05/28/2019   Lab Results  Component Value Date   HGBA1C 6.8 (H) 05/28/2019   HGBA1C 7.1 (H) 03/07/2019   HGBA1C 6.5 (H) 10/04/2018   HGBA1C 6.4 (H) 05/07/2018   HGBA1C 6.9 (H) 11/13/2017   Lab Results  Component Value Date   INSULIN 6.3 03/07/2019  INSULIN 2.5 (L) 10/04/2018   INSULIN 4.3 05/07/2018   INSULIN 7.9 01/24/2018   Lab Results  Component Value Date   TSH 1.510 03/07/2019   Lab Results  Component Value Date   CHOL 124 05/28/2019   HDL 53.40 05/28/2019   LDLCALC 58 05/28/2019   LDLDIRECT 198.6 03/20/2009   TRIG 60.0 05/28/2019   CHOLHDL 2 05/28/2019   Lab Results  Component Value Date   WBC 6.3 03/07/2019   HGB 17.1 03/07/2019    HCT 49.0 03/07/2019   MCV 88 03/07/2019   PLT 207 03/07/2019   Lab Results  Component Value Date   IRON 82 03/07/2019   TIBC 375 03/07/2019   FERRITIN 161 03/07/2019    Obesity Behavioral Intervention Documentation for Insurance:   Approximately 15 minutes were spent on the discussion below.  ASK: We discussed the diagnosis of obesity with Zachary Michael today and Zachary Michael agreed to give [name] permission to discuss obesity behavioral modification therapy today.  ASSESS: Zachary Michael has the diagnosis of obesity and his BMI today is 32.92. Zachary Michael is in the action stage of change.   ADVISE: Zachary Michael was educated on the multiple health risks of obesity as well as the benefit of weight loss to improve his health. He was advised of the need for long term treatment and the importance of lifestyle modifications to improve his current health and to decrease his risk of future health problems.  AGREE: Multiple dietary modification options and treatment options were discussed and Zachary Michael agreed to follow the recommendations documented in the above note.  ARRANGE: Zachary Michael was educated on the importance of frequent visits to treat obesity as outlined per CMS and USPSTF guidelines and agreed to schedule his next follow up appointment today.  Attestation Statements:   Reviewed by clinician on day of visit: allergies, medications, problem list, medical history, surgical history, family history, social history, and previous encounter notes.   I, Trixie Dredge, am acting as transcriptionist for Coralie Common, MD.  I have reviewed the above documentation for accuracy and completeness, and I agree with the above. - Jinny Blossom, MD

## 2019-09-09 ENCOUNTER — Other Ambulatory Visit: Payer: Self-pay | Admitting: Family Medicine

## 2019-09-09 DIAGNOSIS — I10 Essential (primary) hypertension: Secondary | ICD-10-CM

## 2019-09-11 ENCOUNTER — Other Ambulatory Visit: Payer: Self-pay | Admitting: Family Medicine

## 2019-09-11 DIAGNOSIS — IMO0002 Reserved for concepts with insufficient information to code with codable children: Secondary | ICD-10-CM

## 2019-09-11 DIAGNOSIS — E1151 Type 2 diabetes mellitus with diabetic peripheral angiopathy without gangrene: Secondary | ICD-10-CM

## 2019-09-19 ENCOUNTER — Ambulatory Visit (INDEPENDENT_AMBULATORY_CARE_PROVIDER_SITE_OTHER): Payer: Medicare Other | Admitting: Family Medicine

## 2019-09-19 ENCOUNTER — Encounter (INDEPENDENT_AMBULATORY_CARE_PROVIDER_SITE_OTHER): Payer: Self-pay | Admitting: Family Medicine

## 2019-09-19 ENCOUNTER — Other Ambulatory Visit: Payer: Self-pay

## 2019-09-19 VITALS — BP 152/66 | HR 63 | Temp 97.7°F | Ht 69.0 in | Wt 221.0 lb

## 2019-09-19 DIAGNOSIS — I2510 Atherosclerotic heart disease of native coronary artery without angina pectoris: Secondary | ICD-10-CM | POA: Diagnosis not present

## 2019-09-19 DIAGNOSIS — Z6832 Body mass index (BMI) 32.0-32.9, adult: Secondary | ICD-10-CM

## 2019-09-19 DIAGNOSIS — I10 Essential (primary) hypertension: Secondary | ICD-10-CM | POA: Diagnosis not present

## 2019-09-19 DIAGNOSIS — E669 Obesity, unspecified: Secondary | ICD-10-CM | POA: Diagnosis not present

## 2019-09-19 DIAGNOSIS — E1165 Type 2 diabetes mellitus with hyperglycemia: Secondary | ICD-10-CM | POA: Diagnosis not present

## 2019-09-23 NOTE — Progress Notes (Signed)
Chief Complaint:   OBESITY Zachary Michael is here to discuss his progress with his obesity treatment plan along with follow-up of his obesity related diagnoses. Zachary Michael is on keeping a food journal and adhering to recommended goals of 1400-1500 calories and 85+ grams of protein daily and states he is following his eating plan approximately 90% of the time. Zachary Michael states he is walking the dog for 30 minutes 6-7 times per week, and doing yard work for 90 minutes 5 times per week.  Today's visit was #: 32 Starting weight: 301 lbs Starting date: 01/24/2018 Today's weight: 221 lbs Today's date: 09/19/2019 Total lbs lost to date: 80 Total lbs lost since last in-office visit: 2  Interim History: Zachary Michael had 1 low day for calories but 1 high day of calories. He has felt ravenous the past 2 months. On days going over calories he thinks he was eating higher calorie foods. He notes throughout the day he is feeling hungry.  Subjective:   1. Essential hypertension Zachary Michael's blood pressure is slightly elevated today. He denies chest pain, chest pressure, or headaches. He is not on lisinopril (on metoprolol only).  2. Type 2 diabetes mellitus with hyperglycemia, without long-term current use of insulin (HCC) Zachary Michael is on metformin daily and he denies GI side effects.  Assessment/Plan:   1. Essential hypertension Zachary Michael is working on healthy weight loss and exercise to improve blood pressure control. We will watch for signs of hypotension as he continues his lifestyle modifications. Zachary Michael will continue metoprolol, with no change in dose.  2. Type 2 diabetes mellitus with hyperglycemia, without long-term current use of insulin (HCC) Good blood sugar control is important to decrease the likelihood of diabetic complications such as nephropathy, neuropathy, limb loss, blindness, coronary artery disease, and death. Intensive lifestyle modification including diet, exercise and weight loss are the first  line of treatment for diabetes. Zachary Michael will continue metformin, no refill needed.  3. Class 1 obesity with serious comorbidity and body mass index (BMI) of 32.0 to 32.9 in adult, unspecified obesity type Zachary Michael is currently in the action stage of change. As such, his goal is to continue with weight loss efforts. He has agreed to keeping a food journal and adhering to recommended goals of 1400-1600 calories and 90+ grams of protein daily.   Exercise goals: As is.  Behavioral modification strategies: increasing lean protein intake, meal planning and cooking strategies, keeping healthy foods in the home, planning for success and keeping a strict food journal.  Zachary Michael has agreed to follow-up with our clinic in 4 weeks. He was informed of the importance of frequent follow-up visits to maximize his success with intensive lifestyle modifications for his multiple health conditions.   Objective:   Blood pressure (!) 152/66, pulse 63, temperature 97.7 F (36.5 C), temperature source Oral, height 5\' 9"  (1.753 m), weight 221 lb (100.2 kg), SpO2 97 %. Body mass index is 32.64 kg/m.  General: Cooperative, alert, well developed, in no acute distress. HEENT: Conjunctivae and lids unremarkable. Cardiovascular: Regular rhythm.  Lungs: Normal work of breathing. Neurologic: No focal deficits.   Lab Results  Component Value Date   CREATININE 0.69 05/28/2019   BUN 18 05/28/2019   NA 137 05/28/2019   K 4.2 05/28/2019   CL 103 05/28/2019   CO2 27 05/28/2019   Lab Results  Component Value Date   ALT 11 05/28/2019   AST 14 05/28/2019   ALKPHOS 35 (L) 05/28/2019   BILITOT 0.6 05/28/2019   Lab  Results  Component Value Date   HGBA1C 6.8 (H) 05/28/2019   HGBA1C 7.1 (H) 03/07/2019   HGBA1C 6.5 (H) 10/04/2018   HGBA1C 6.4 (H) 05/07/2018   HGBA1C 6.9 (H) 11/13/2017   Lab Results  Component Value Date   INSULIN 6.3 03/07/2019   INSULIN 2.5 (L) 10/04/2018   INSULIN 4.3 05/07/2018   INSULIN 7.9  01/24/2018   Lab Results  Component Value Date   TSH 1.510 03/07/2019   Lab Results  Component Value Date   CHOL 124 05/28/2019   HDL 53.40 05/28/2019   LDLCALC 58 05/28/2019   LDLDIRECT 198.6 03/20/2009   TRIG 60.0 05/28/2019   CHOLHDL 2 05/28/2019   Lab Results  Component Value Date   WBC 6.3 03/07/2019   HGB 17.1 03/07/2019   HCT 49.0 03/07/2019   MCV 88 03/07/2019   PLT 207 03/07/2019   Lab Results  Component Value Date   IRON 82 03/07/2019   TIBC 375 03/07/2019   FERRITIN 161 03/07/2019   Attestation Statements:   Reviewed by clinician on day of visit: allergies, medications, problem list, medical history, surgical history, family history, social history, and previous encounter notes.  Time spent on visit including pre-visit chart review and post-visit care and charting was 15 minutes.    I, Burt Knack, am acting as transcriptionist for Reuben Likes, MD.  I have reviewed the above documentation for accuracy and completeness, and I agree with the above. - Katherina Mires, MD

## 2019-10-17 ENCOUNTER — Other Ambulatory Visit: Payer: Self-pay

## 2019-10-17 ENCOUNTER — Ambulatory Visit (INDEPENDENT_AMBULATORY_CARE_PROVIDER_SITE_OTHER): Payer: Medicare Other | Admitting: Family Medicine

## 2019-10-17 ENCOUNTER — Encounter (INDEPENDENT_AMBULATORY_CARE_PROVIDER_SITE_OTHER): Payer: Self-pay | Admitting: Family Medicine

## 2019-10-17 VITALS — BP 108/57 | HR 67 | Temp 97.8°F | Ht 69.0 in | Wt 219.0 lb

## 2019-10-17 DIAGNOSIS — I2510 Atherosclerotic heart disease of native coronary artery without angina pectoris: Secondary | ICD-10-CM | POA: Diagnosis not present

## 2019-10-17 DIAGNOSIS — E1169 Type 2 diabetes mellitus with other specified complication: Secondary | ICD-10-CM | POA: Diagnosis not present

## 2019-10-17 DIAGNOSIS — E785 Hyperlipidemia, unspecified: Secondary | ICD-10-CM | POA: Diagnosis not present

## 2019-10-17 DIAGNOSIS — E669 Obesity, unspecified: Secondary | ICD-10-CM | POA: Diagnosis not present

## 2019-10-17 DIAGNOSIS — E1165 Type 2 diabetes mellitus with hyperglycemia: Secondary | ICD-10-CM | POA: Diagnosis not present

## 2019-10-17 DIAGNOSIS — Z6832 Body mass index (BMI) 32.0-32.9, adult: Secondary | ICD-10-CM | POA: Diagnosis not present

## 2019-10-17 MED ORDER — METFORMIN HCL 1000 MG PO TABS: 1000.0000 mg | ORAL_TABLET | Freq: Two times a day (BID) | ORAL | 0 refills | Status: DC

## 2019-10-17 MED ORDER — SIMVASTATIN 40 MG PO TABS: 40.0000 mg | ORAL_TABLET | Freq: Every day | ORAL | 0 refills | Status: DC

## 2019-10-17 MED ORDER — FENOFIBRATE 160 MG PO TABS: 160.0000 mg | ORAL_TABLET | Freq: Every day | ORAL | 0 refills | Status: DC

## 2019-10-17 NOTE — Progress Notes (Signed)
Chief Complaint:   OBESITY Adelfo is here to discuss his progress with his obesity treatment plan along with follow-up of his obesity related diagnoses. Leman is on keeping a food journal and adhering to recommended goals of 1400-1600 calories and 90+ grams of protein daily and states he is following his eating plan approximately 90-95% of the time. Gayland states he is walking the dog for 60 minutes 6-7 times per week.  Today's visit was #: 33 Starting weight: 301 lbs Starting date: 01/24/2018 Today's weight: 219 lbs Today's date: 10/17/2019 Total lbs lost to date: 82 Total lbs lost since last in-office visit: 2  Interim History: Inigo is still journaling and getting around 1400-1450 calories and easily getting on over 90 grams of protein. He denies hunger that is unmanageable. He and his wife are both walking the dog daily.  Subjective:   1. Type 2 diabetes mellitus with hyperglycemia, without long-term current use of insulin (HCC) Laiden is not checking his blood sugar at home. He denies GI side effects of metformin. Last A1c was 6.8. He notes minimal carbohydrate cravings.  2. Hyperlipidemia associated with type 2 diabetes mellitus (HCC) Latavion is on simvastatin and fenofibrate. Last LFTs were within normal limits.  Assessment/Plan:   1. Type 2 diabetes mellitus with hyperglycemia, without long-term current use of insulin (HCC) Good blood sugar control is important to decrease the likelihood of diabetic complications such as nephropathy, neuropathy, limb loss, blindness, coronary artery disease, and death. Intensive lifestyle modification including diet, exercise and weight loss are the first line of treatment for diabetes. Aundrey will continue metformin, and we will refill metformin for 90 days with no refill.  - metFORMIN (GLUCOPHAGE) 1000 MG tablet; Take 1 tablet (1,000 mg total) by mouth 2 (two) times daily with a meal.  Dispense: 180 tablet; Refill: 0  2.  Hyperlipidemia associated with type 2 diabetes mellitus (HCC) Cardiovascular risk and specific lipid/LDL goals reviewed. We discussed several lifestyle modifications today and Virlan will continue to work on diet, exercise and weight loss efforts. We will refill both fenofibrate and atorvastatin for 90 days with no refill. Orders and follow up as documented in patient record.   Counseling Intensive lifestyle modifications are the first line treatment for this issue. . Dietary changes: Increase soluble fiber. Decrease simple carbohydrates. . Exercise changes: Moderate to vigorous-intensity aerobic activity 150 minutes per week if tolerated. . Lipid-lowering medications: see documented in medical record.  - fenofibrate 160 MG tablet; Take 1 tablet (160 mg total) by mouth daily.  Dispense: 90 tablet; Refill: 0 - simvastatin (ZOCOR) 40 MG tablet; Take 1 tablet (40 mg total) by mouth daily at 6 PM.  Dispense: 90 tablet; Refill: 0  3. Class 1 obesity with serious comorbidity and body mass index (BMI) of 32.0 to 32.9 in adult, unspecified obesity type Jarrel is currently in the action stage of change. As such, his goal is to continue with weight loss efforts. He has agreed to keeping a food journal and adhering to recommended goals of 1400-1700 calories and 90+ grams of protein daily.   Exercise goals: As is, and add resistance training for 5-10 minutes 3 times per week.  Behavioral modification strategies: increasing lean protein intake, meal planning and cooking strategies, keeping healthy foods in the home and planning for success.  Ismael has agreed to follow-up with our clinic in 4 weeks. He was informed of the importance of frequent follow-up visits to maximize his success with intensive lifestyle modifications for his  multiple health conditions.   Objective:   Blood pressure (!) 108/57, pulse 67, temperature 97.8 F (36.6 C), temperature source Oral, height 5\' 9"  (1.753 m), weight 219 lb  (99.3 kg), SpO2 96 %. Body mass index is 32.34 kg/m.  General: Cooperative, alert, well developed, in no acute distress. HEENT: Conjunctivae and lids unremarkable. Cardiovascular: Regular rhythm.  Lungs: Normal work of breathing. Neurologic: No focal deficits.   Lab Results  Component Value Date   CREATININE 0.69 05/28/2019   BUN 18 05/28/2019   NA 137 05/28/2019   K 4.2 05/28/2019   CL 103 05/28/2019   CO2 27 05/28/2019   Lab Results  Component Value Date   ALT 11 05/28/2019   AST 14 05/28/2019   ALKPHOS 35 (L) 05/28/2019   BILITOT 0.6 05/28/2019   Lab Results  Component Value Date   HGBA1C 6.8 (H) 05/28/2019   HGBA1C 7.1 (H) 03/07/2019   HGBA1C 6.5 (H) 10/04/2018   HGBA1C 6.4 (H) 05/07/2018   HGBA1C 6.9 (H) 11/13/2017   Lab Results  Component Value Date   INSULIN 6.3 03/07/2019   INSULIN 2.5 (L) 10/04/2018   INSULIN 4.3 05/07/2018   INSULIN 7.9 01/24/2018   Lab Results  Component Value Date   TSH 1.510 03/07/2019   Lab Results  Component Value Date   CHOL 124 05/28/2019   HDL 53.40 05/28/2019   LDLCALC 58 05/28/2019   LDLDIRECT 198.6 03/20/2009   TRIG 60.0 05/28/2019   CHOLHDL 2 05/28/2019   Lab Results  Component Value Date   WBC 6.3 03/07/2019   HGB 17.1 03/07/2019   HCT 49.0 03/07/2019   MCV 88 03/07/2019   PLT 207 03/07/2019   Lab Results  Component Value Date   IRON 82 03/07/2019   TIBC 375 03/07/2019   FERRITIN 161 03/07/2019    Obesity Behavioral Intervention Documentation for Insurance:   Approximately 15 minutes were spent on the discussion below.  ASK: We discussed the diagnosis of obesity with 05/05/2019 today and Tilak agreed to give [name] permission to discuss obesity behavioral modification therapy today.  ASSESS: Sahith has the diagnosis of obesity and his BMI today is 32.33. Gregory is in the action stage of change.   ADVISE: Naftula was educated on the multiple health risks of obesity as well as the benefit of weight loss  to improve his health. He was advised of the need for long term treatment and the importance of lifestyle modifications to improve his current health and to decrease his risk of future health problems.  AGREE: Multiple dietary modification options and treatment options were discussed and Romulus agreed to follow the recommendations documented in the above note.  ARRANGE: Ksean was educated on the importance of frequent visits to treat obesity as outlined per CMS and USPSTF guidelines and agreed to schedule his next follow up appointment today.  Attestation Statements:   Reviewed by clinician on day of visit: allergies, medications, problem list, medical history, surgical history, family history, social history, and previous encounter notes.   I, [name], am acting as transcriptionist for Burt Knack, MD.  I have reviewed the above documentation for accuracy and completeness, and I agree with the above. - Reuben Likes, MD

## 2019-11-13 ENCOUNTER — Other Ambulatory Visit: Payer: Self-pay

## 2019-11-13 ENCOUNTER — Ambulatory Visit (INDEPENDENT_AMBULATORY_CARE_PROVIDER_SITE_OTHER): Payer: Medicare Other | Admitting: Family Medicine

## 2019-11-13 ENCOUNTER — Encounter (INDEPENDENT_AMBULATORY_CARE_PROVIDER_SITE_OTHER): Payer: Self-pay | Admitting: Family Medicine

## 2019-11-13 VITALS — BP 119/70 | HR 66 | Temp 98.0°F | Ht 69.0 in | Wt 217.0 lb

## 2019-11-13 DIAGNOSIS — E669 Obesity, unspecified: Secondary | ICD-10-CM | POA: Diagnosis not present

## 2019-11-13 DIAGNOSIS — Z6832 Body mass index (BMI) 32.0-32.9, adult: Secondary | ICD-10-CM | POA: Diagnosis not present

## 2019-11-13 DIAGNOSIS — E785 Hyperlipidemia, unspecified: Secondary | ICD-10-CM

## 2019-11-13 DIAGNOSIS — E1169 Type 2 diabetes mellitus with other specified complication: Secondary | ICD-10-CM

## 2019-11-13 DIAGNOSIS — I2510 Atherosclerotic heart disease of native coronary artery without angina pectoris: Secondary | ICD-10-CM

## 2019-11-13 DIAGNOSIS — E559 Vitamin D deficiency, unspecified: Secondary | ICD-10-CM | POA: Diagnosis not present

## 2019-11-13 MED ORDER — SIMVASTATIN 40 MG PO TABS: 40.0000 mg | ORAL_TABLET | Freq: Every day | ORAL | 0 refills | Status: DC

## 2019-11-14 NOTE — Progress Notes (Signed)
Chief Complaint:   OBESITY Zachary Michael is here to discuss his progress with his obesity treatment plan along with follow-up of his obesity related diagnoses. Zachary Michael is on keeping a food journal and adhering to recommended goals of 1400-1700 calories and 90+ grams of protein daily and states he is following his eating plan approximately 90% of the time. Zachary Michael states he is walking the dog and doing yard work for 30 minutes 6-7 times per week.  Today's visit was #: 34 Starting weight: 301 lbs Starting date: 01/24/2018 Today's weight: 217 lbs Today's date: 11/13/2019 Total lbs lost to date: 84 Total lbs lost since last in-office visit: 2  Interim History: Zachary Michael has had a good 6 weeks. He has been journaling still using a computer and is finding easier ways to put all of the food together. He denies uncontrollable hunger. He is not always eating on a  Regular schedule. His anniversary with his wife is in 2 days. His goal weight is 200 lbs.  Subjective:   1. Hyperlipidemia associated with type 2 diabetes mellitus (HCC) Zachary Michael is on Zocor 40 mg and he denies myalgias. Last LDL was 58.  2. Vitamin D deficiency Zachary Michael denies nausea, vomiting, or muscle weakness, and he notes fatigue has improved. Last Vit D level was 93.  Assessment/Plan:   1. Hyperlipidemia associated with type 2 diabetes mellitus (HCC) Cardiovascular risk and specific lipid/LDL goals reviewed. We discussed several lifestyle modifications today. Zachary Michael will continue to work on diet, exercise and weight loss efforts. His cholesterol level is at goal. We will refill Zocor for 90 days, with no refill. Orders and follow up as documented in patient record.   Counseling Intensive lifestyle modifications are the first line treatment for this issue. . Dietary changes: Increase soluble fiber. Decrease simple carbohydrates. . Exercise changes: Moderate to vigorous-intensity aerobic activity 150 minutes per week if  tolerated. . Lipid-lowering medications: see documented in medical record.  - simvastatin (ZOCOR) 40 MG tablet; Take 1 tablet (40 mg total) by mouth daily at 6 PM.  Dispense: 90 tablet; Refill: 0  2. Vitamin D deficiency Low Vitamin D level contributes to fatigue and are associated with obesity, breast, and colon cancer. Zachary Michael agreed to continue taking OTC Vitamin D and will follow-up for routine testing of Vitamin D, at least 2-3 times per year to avoid over-replacement.  3. Class 1 obesity with serious comorbidity and body mass index (BMI) of 32.0 to 32.9 in adult, unspecified obesity type Zachary Michael is currently in the action stage of change. As such, his goal is to continue with weight loss efforts. He has agreed to keeping a food journal and adhering to recommended goals of 1400-1600 calories and 90+ grams of protein daily.   Exercise goals: As is.  Behavioral modification strategies: increasing lean protein intake, meal planning and cooking strategies and keeping a strict food journal.  Zachary Michael has agreed to follow-up with our clinic in 4 to 5 weeks. He was informed of the importance of frequent follow-up visits to maximize his success with intensive lifestyle modifications for his multiple health conditions.   Objective:   Blood pressure 119/70, pulse 66, temperature 98 F (36.7 C), temperature source Oral, height 5\' 9"  (1.753 m), weight 217 lb (98.4 kg), SpO2 98 %. Body mass index is 32.05 kg/m.  General: Cooperative, alert, well developed, in no acute distress. HEENT: Conjunctivae and lids unremarkable. Cardiovascular: Regular rhythm.  Lungs: Normal work of breathing. Neurologic: No focal deficits.   Lab Results  Component Value Date   CREATININE 0.69 05/28/2019   BUN 18 05/28/2019   NA 137 05/28/2019   K 4.2 05/28/2019   CL 103 05/28/2019   CO2 27 05/28/2019   Lab Results  Component Value Date   ALT 11 05/28/2019   AST 14 05/28/2019   ALKPHOS 35 (L) 05/28/2019    BILITOT 0.6 05/28/2019   Lab Results  Component Value Date   HGBA1C 6.8 (H) 05/28/2019   HGBA1C 7.1 (H) 03/07/2019   HGBA1C 6.5 (H) 10/04/2018   HGBA1C 6.4 (H) 05/07/2018   HGBA1C 6.9 (H) 11/13/2017   Lab Results  Component Value Date   INSULIN 6.3 03/07/2019   INSULIN 2.5 (L) 10/04/2018   INSULIN 4.3 05/07/2018   INSULIN 7.9 01/24/2018   Lab Results  Component Value Date   TSH 1.510 03/07/2019   Lab Results  Component Value Date   CHOL 124 05/28/2019   HDL 53.40 05/28/2019   LDLCALC 58 05/28/2019   LDLDIRECT 198.6 03/20/2009   TRIG 60.0 05/28/2019   CHOLHDL 2 05/28/2019   Lab Results  Component Value Date   WBC 6.3 03/07/2019   HGB 17.1 03/07/2019   HCT 49.0 03/07/2019   MCV 88 03/07/2019   PLT 207 03/07/2019   Lab Results  Component Value Date   IRON 82 03/07/2019   TIBC 375 03/07/2019   FERRITIN 161 03/07/2019    Obesity Behavioral Intervention:   Approximately 15 minutes were spent on the discussion below.  ASK: We discussed the diagnosis of obesity with Zachary Michael today and Zachary Michael agreed to give [name] permission to discuss obesity behavioral modification therapy today.  ASSESS: Zachary Michael has the diagnosis of obesity and his BMI today is 32.03. Zachary Michael is in the action stage of change.   ADVISE: Zachary Michael was educated on the multiple health risks of obesity as well as the benefit of weight loss to improve his health. He was advised of the need for long term treatment and the importance of lifestyle modifications to improve his current health and to decrease his risk of future health problems.  AGREE: Multiple dietary modification options and treatment options were discussed and Zachary Michael agreed to follow the recommendations documented in the above note.  ARRANGE: Zachary Michael was educated on the importance of frequent visits to treat obesity as outlined per CMS and USPSTF guidelines and agreed to schedule his next follow up appointment today.  Attestation  Statements:   Reviewed by clinician on day of visit: allergies, medications, problem list, medical history, surgical history, family history, social history, and previous encounter notes.   I, Burt Knack, am acting as transcriptionist for Reuben Likes, MD.  I have reviewed the above documentation for accuracy and completeness, and I agree with the above. - Katherina Mires, MD

## 2019-12-03 ENCOUNTER — Other Ambulatory Visit: Payer: Self-pay

## 2019-12-03 ENCOUNTER — Ambulatory Visit (INDEPENDENT_AMBULATORY_CARE_PROVIDER_SITE_OTHER): Payer: Medicare Other | Admitting: Family Medicine

## 2019-12-03 ENCOUNTER — Encounter: Payer: Self-pay | Admitting: Family Medicine

## 2019-12-03 VITALS — BP 120/66 | HR 65 | Temp 98.0°F | Resp 18 | Ht 69.0 in | Wt 222.2 lb

## 2019-12-03 DIAGNOSIS — E785 Hyperlipidemia, unspecified: Secondary | ICD-10-CM | POA: Diagnosis not present

## 2019-12-03 DIAGNOSIS — E119 Type 2 diabetes mellitus without complications: Secondary | ICD-10-CM | POA: Insufficient documentation

## 2019-12-03 DIAGNOSIS — E1159 Type 2 diabetes mellitus with other circulatory complications: Secondary | ICD-10-CM | POA: Diagnosis not present

## 2019-12-03 DIAGNOSIS — I2510 Atherosclerotic heart disease of native coronary artery without angina pectoris: Secondary | ICD-10-CM | POA: Diagnosis not present

## 2019-12-03 DIAGNOSIS — Z1211 Encounter for screening for malignant neoplasm of colon: Secondary | ICD-10-CM | POA: Diagnosis not present

## 2019-12-03 DIAGNOSIS — I10 Essential (primary) hypertension: Secondary | ICD-10-CM

## 2019-12-03 MED ORDER — FISH OIL 1000 MG PO CAPS: ORAL_CAPSULE | ORAL | 0 refills | Status: AC

## 2019-12-03 MED ORDER — VITAMIN D3 25 MCG (1000 UNIT) PO TABS: 1000.0000 [IU] | ORAL_TABLET | Freq: Every day | ORAL | Status: AC

## 2019-12-03 MED ORDER — ZINC 100 MG PO TABS: ORAL_TABLET | ORAL | 0 refills | Status: AC

## 2019-12-03 NOTE — Patient Instructions (Signed)
DASH Eating Plan DASH stands for "Dietary Approaches to Stop Hypertension." The DASH eating plan is a healthy eating plan that has been shown to reduce high blood pressure (hypertension). It may also reduce your risk for type 2 diabetes, heart disease, and stroke. The DASH eating plan may also help with weight loss. What are tips for following this plan?  General guidelines  Avoid eating more than 2,300 mg (milligrams) of salt (sodium) a day. If you have hypertension, you may need to reduce your sodium intake to 1,500 mg a day.  Limit alcohol intake to no more than 1 drink a day for nonpregnant women and 2 drinks a day for men. One drink equals 12 oz of beer, 5 oz of wine, or 1 oz of hard liquor.  Work with your health care provider to maintain a healthy body weight or to lose weight. Ask what an ideal weight is for you.  Get at least 30 minutes of exercise that causes your heart to beat faster (aerobic exercise) most days of the week. Activities may include walking, swimming, or biking.  Work with your health care provider or diet and nutrition specialist (dietitian) to adjust your eating plan to your individual calorie needs. Reading food labels   Check food labels for the amount of sodium per serving. Choose foods with less than 5 percent of the Daily Value of sodium. Generally, foods with less than 300 mg of sodium per serving fit into this eating plan.  To find whole grains, look for the word "whole" as the first word in the ingredient list. Shopping  Buy products labeled as "low-sodium" or "no salt added."  Buy fresh foods. Avoid canned foods and premade or frozen meals. Cooking  Avoid adding salt when cooking. Use salt-free seasonings or herbs instead of table salt or sea salt. Check with your health care provider or pharmacist before using salt substitutes.  Do not fry foods. Cook foods using healthy methods such as baking, boiling, grilling, and broiling instead.  Cook with  heart-healthy oils, such as olive, canola, soybean, or sunflower oil. Meal planning  Eat a balanced diet that includes: ? 5 or more servings of fruits and vegetables each day. At each meal, try to fill half of your plate with fruits and vegetables. ? Up to 6-8 servings of whole grains each day. ? Less than 6 oz of lean meat, poultry, or fish each day. A 3-oz serving of meat is about the same size as a deck of cards. One egg equals 1 oz. ? 2 servings of low-fat dairy each day. ? A serving of nuts, seeds, or beans 5 times each week. ? Heart-healthy fats. Healthy fats called Omega-3 fatty acids are found in foods such as flaxseeds and coldwater fish, like sardines, salmon, and mackerel.  Limit how much you eat of the following: ? Canned or prepackaged foods. ? Food that is high in trans fat, such as fried foods. ? Food that is high in saturated fat, such as fatty meat. ? Sweets, desserts, sugary drinks, and other foods with added sugar. ? Full-fat dairy products.  Do not salt foods before eating.  Try to eat at least 2 vegetarian meals each week.  Eat more home-cooked food and less restaurant, buffet, and fast food.  When eating at a restaurant, ask that your food be prepared with less salt or no salt, if possible. What foods are recommended? The items listed may not be a complete list. Talk with your dietitian about   what dietary choices are best for you. Grains Whole-grain or whole-wheat bread. Whole-grain or whole-wheat pasta. Brown rice. Oatmeal. Quinoa. Bulgur. Whole-grain and low-sodium cereals. Pita bread. Low-fat, low-sodium crackers. Whole-wheat flour tortillas. Vegetables Fresh or frozen vegetables (raw, steamed, roasted, or grilled). Low-sodium or reduced-sodium tomato and vegetable juice. Low-sodium or reduced-sodium tomato sauce and tomato paste. Low-sodium or reduced-sodium canned vegetables. Fruits All fresh, dried, or frozen fruit. Canned fruit in natural juice (without  added sugar). Meat and other protein foods Skinless chicken or turkey. Ground chicken or turkey. Pork with fat trimmed off. Fish and seafood. Egg whites. Dried beans, peas, or lentils. Unsalted nuts, nut butters, and seeds. Unsalted canned beans. Lean cuts of beef with fat trimmed off. Low-sodium, lean deli meat. Dairy Low-fat (1%) or fat-free (skim) milk. Fat-free, low-fat, or reduced-fat cheeses. Nonfat, low-sodium ricotta or cottage cheese. Low-fat or nonfat yogurt. Low-fat, low-sodium cheese. Fats and oils Soft margarine without trans fats. Vegetable oil. Low-fat, reduced-fat, or light mayonnaise and salad dressings (reduced-sodium). Canola, safflower, olive, soybean, and sunflower oils. Avocado. Seasoning and other foods Herbs. Spices. Seasoning mixes without salt. Unsalted popcorn and pretzels. Fat-free sweets. What foods are not recommended? The items listed may not be a complete list. Talk with your dietitian about what dietary choices are best for you. Grains Baked goods made with fat, such as croissants, muffins, or some breads. Dry pasta or rice meal packs. Vegetables Creamed or fried vegetables. Vegetables in a cheese sauce. Regular canned vegetables (not low-sodium or reduced-sodium). Regular canned tomato sauce and paste (not low-sodium or reduced-sodium). Regular tomato and vegetable juice (not low-sodium or reduced-sodium). Pickles. Olives. Fruits Canned fruit in a light or heavy syrup. Fried fruit. Fruit in cream or butter sauce. Meat and other protein foods Fatty cuts of meat. Ribs. Fried meat. Bacon. Sausage. Bologna and other processed lunch meats. Salami. Fatback. Hotdogs. Bratwurst. Salted nuts and seeds. Canned beans with added salt. Canned or smoked fish. Whole eggs or egg yolks. Chicken or turkey with skin. Dairy Whole or 2% milk, cream, and half-and-half. Whole or full-fat cream cheese. Whole-fat or sweetened yogurt. Full-fat cheese. Nondairy creamers. Whipped toppings.  Processed cheese and cheese spreads. Fats and oils Butter. Stick margarine. Lard. Shortening. Ghee. Bacon fat. Tropical oils, such as coconut, palm kernel, or palm oil. Seasoning and other foods Salted popcorn and pretzels. Onion salt, garlic salt, seasoned salt, table salt, and sea salt. Worcestershire sauce. Tartar sauce. Barbecue sauce. Teriyaki sauce. Soy sauce, including reduced-sodium. Steak sauce. Canned and packaged gravies. Fish sauce. Oyster sauce. Cocktail sauce. Horseradish that you find on the shelf. Ketchup. Mustard. Meat flavorings and tenderizers. Bouillon cubes. Hot sauce and Tabasco sauce. Premade or packaged marinades. Premade or packaged taco seasonings. Relishes. Regular salad dressings. Where to find more information:  National Heart, Lung, and Blood Institute: www.nhlbi.nih.gov  American Heart Association: www.heart.org Summary  The DASH eating plan is a healthy eating plan that has been shown to reduce high blood pressure (hypertension). It may also reduce your risk for type 2 diabetes, heart disease, and stroke.  With the DASH eating plan, you should limit salt (sodium) intake to 2,300 mg a day. If you have hypertension, you may need to reduce your sodium intake to 1,500 mg a day.  When on the DASH eating plan, aim to eat more fresh fruits and vegetables, whole grains, lean proteins, low-fat dairy, and heart-healthy fats.  Work with your health care provider or diet and nutrition specialist (dietitian) to adjust your eating plan to your   individual calorie needs. This information is not intended to replace advice given to you by your health care provider. Make sure you discuss any questions you have with your health care provider. Document Revised: 01/27/2017 Document Reviewed: 02/08/2016 Elsevier Patient Education  2020 Elsevier Inc.  

## 2019-12-03 NOTE — Assessment & Plan Note (Addendum)
Encouraged heart healthy diet, increase exercise, avoid trans fats, consider a krill oil cap daily con't fenofibrate and zocor

## 2019-12-03 NOTE — Assessment & Plan Note (Addendum)
hgba1c to be checked , minimize simple carbs. Increase exercise as tolerated. Continue current meds Check labs today con't metformin

## 2019-12-03 NOTE — Progress Notes (Signed)
Patient ID: Zachary Michael, male    DOB: [birth year]  Age: 68 y.o. MRN: [id number]    Subjective:  Subjective  HPI Zachary Michael presents for f/u dm, chol and bp.   Zachary Michael has lost almost 90 lbs and is doing great with healthy weight and wellness.  No complaints-- except with dec in coreg Zachary Michael is having more tremors Zachary Michael also is starting to have a trigger finger in middle finger --- not enough to want to do anything about it yet   Review of Systems  Constitutional: Negative for appetite change, diaphoresis, fatigue and unexpected weight change.  Eyes: Negative for pain, redness and visual disturbance.  Respiratory: Negative for cough, chest tightness, shortness of breath and wheezing.   Cardiovascular: Negative for chest pain, palpitations and leg swelling.  Endocrine: Negative for cold intolerance, heat intolerance, polydipsia, polyphagia and polyuria.  Genitourinary: Negative for difficulty urinating, dysuria and frequency.  Neurological: Positive for tremors. Negative for dizziness, light-headedness, numbness and headaches.    History Past Medical History:  Diagnosis Date  . Asthma   . Back pain   . CAD (coronary artery disease)    Mild  . Diabetes mellitus   . Heart valve problem   . HTN (hypertension)   . Hyperlipidemia   . Joint pain   . OSA (obstructive sleep apnea) 08/09/2017  . Palpitations   . SOB (shortness of breath)   . Subluxation    L2-3-4    Zachary Michael has a past surgical history that includes Appendectomy; Tonsillectomy; Tumor removal; Cardiac catheterization; and Cardiovascular stress test.   His family history includes Arthritis in his mother; Cancer in his brother and father; Diabetes in his maternal aunt and mother; Hypertension in his maternal aunt, maternal aunt, and mother; Lung cancer in his unknown relative; Osteoporosis in his mother; Pneumonia in his mother.Zachary Michael reports that Zachary Michael quit smoking about 41 years ago. His smoking use included pipe. Zachary Michael quit after 7.00 years of  use. Zachary Michael has never used smokeless tobacco. Zachary Michael reports current alcohol use. Zachary Michael reports that Zachary Michael does not use drugs.  Current Outpatient Medications on File Prior to Visit  Medication Sig Dispense Refill  . aspirin 81 MG tablet Take 81 mg by mouth daily.      [name] b complex vitamins tablet Take 1 tablet by mouth daily.    . calcium carbonate (CALCIUM 600) 600 MG TABS tablet Take 600 mg by mouth daily with breakfast.    . carvedilol (COREG) 3.125 MG tablet Take 1 tablet (3.125 mg total) by mouth daily. 1/2 tablet twice a day (Patient taking differently: Take 3.125 mg by mouth daily. 1/2 tablet once a day) 30 tablet 0  . Cinnamon 500 MG capsule Take 500 mg by mouth daily.    . fenofibrate 160 MG tablet Take 1 tablet (160 mg total) by mouth daily. 90 tablet 0  . ferrous sulfate 325 (65 FE) MG EC tablet Take 325 mg by mouth 3 (three) times daily with meals.    . metFORMIN (GLUCOPHAGE) 1000 MG tablet Take 1 tablet (1,000 mg total) by mouth 2 (two) times daily with a meal. 180 tablet 0  . Multiple Vitamin (MULTIVITAMIN) capsule Take 1 capsule by mouth daily.    . nitroGLYCERIN (NITROSTAT) 0.4 MG SL tablet Place 1 tablet (0.4 mg total) under the tongue every 5 (five) minutes as needed for chest pain. 25 tablet 5  . simvastatin (ZOCOR) 40 MG tablet Take 1 tablet (40 mg total) by mouth daily at 6  PM. 90 tablet 0   No current facility-administered medications on file prior to visit.     Objective:  Objective  Physical Exam Vitals and nursing note reviewed.  Constitutional:      General: Zachary Michael is sleeping.     Appearance: Zachary Michael is well-developed.  HENT:     Head: Normocephalic and atraumatic.  Eyes:     Pupils: Pupils are equal, round, and reactive to light.  Neck:     Thyroid: No thyromegaly.  Cardiovascular:     Rate and Rhythm: Normal rate and regular rhythm.     Heart sounds: No murmur heard.   Pulmonary:     Effort: Pulmonary effort is normal. No respiratory distress.     Breath sounds: Normal  breath sounds. No wheezing or rales.  Chest:     Chest wall: No tenderness.  Musculoskeletal:        General: No tenderness.     Cervical back: Normal range of motion and neck supple.  Skin:    General: Skin is warm and dry.  Neurological:     Mental Status: Zachary Michael is oriented to person, place, and time.  Psychiatric:        Behavior: Behavior normal.        Thought Content: Thought content normal.        Judgment: Judgment normal.    BP 120/66 (BP Location: Right Arm, Patient Position: Sitting, Cuff Size: Normal)   Pulse 65   Temp 98 F (36.7 C) (Oral)   Resp 18   Ht 5\' 9"  (1.753 m)   Wt 222 lb 3.2 oz (100.8 kg)   SpO2 98%   BMI 32.81 kg/m  Wt Readings from Last 3 Encounters:  12/03/19 222 lb 3.2 oz (100.8 kg)  11/13/19 217 lb (98.4 kg)  10/17/19 219 lb (99.3 kg)     Lab Results  Component Value Date   WBC 6.3 03/07/2019   HGB 17.1 03/07/2019   HCT 49.0 03/07/2019   PLT 207 03/07/2019   GLUCOSE 153 (H) 05/28/2019   CHOL 124 05/28/2019   TRIG 60.0 05/28/2019   HDL 53.40 05/28/2019   LDLDIRECT 198.6 03/20/2009   LDLCALC 58 05/28/2019   ALT 11 05/28/2019   AST 14 05/28/2019   NA 137 05/28/2019   K 4.2 05/28/2019   CL 103 05/28/2019   CREATININE 0.69 05/28/2019   BUN 18 05/28/2019   CO2 27 05/28/2019   TSH 1.510 03/07/2019   PSA 3.68 11/07/2016   INR 1.0 06/02/2008   HGBA1C 6.8 (H) 05/28/2019   MICROALBUR 1.2 11/06/2014    DG Chest 2 View  Result Date: 11/06/2014 CLINICAL DATA:  Cough, wheezing for hours EXAM: CHEST  2 VIEW COMPARISON:  06/02/2008 FINDINGS: The heart size and mediastinal contours are within normal limits. Both lungs are clear. The visualized skeletal structures are unremarkable. IMPRESSION: No active cardiopulmonary disease. Electronically Signed   By: 08/02/2008   On: 11/06/2014 15:11     Assessment & Plan:  Plan  I am having 01/06/2015 start on Fish Oil, cholecalciferol, and Zinc. I am also having him maintain his aspirin,  nitroGLYCERIN, multivitamin, calcium carbonate, ferrous sulfate, b complex vitamins, Cinnamon, carvedilol, fenofibrate, metFORMIN, and simvastatin.  Meds ordered this encounter  Medications  . Omega-3 Fatty Acids (FISH OIL) 1000 MG CAPS    Sig: 1 po qd    Dispense:  30 capsule    Refill:  0  . cholecalciferol (VITAMIN D) 25 MCG (1000 UNIT) tablet  Sig: Take 1 tablet (1,000 Units total) by mouth daily.  . Zinc 100 MG TABS    Sig: 1 po qd    Refill:  0    Problem List Items Addressed This Visit      Unprioritized   Diabetes mellitus type II, controlled (HCC)    hgba1c to be checked , minimize simple carbs. Increase exercise as tolerated. Continue current meds Check labs today con't metformin       Relevant Orders   Lipid panel   CBC with Differential/Platelet   Comprehensive metabolic panel   Insulin, random   Hemoglobin A1c   Hyperlipidemia    Encouraged heart healthy diet, increase exercise, avoid trans fats, consider a krill oil cap daily con't fenofibrate and zocor       Relevant Orders   Lipid panel   CBC with Differential/Platelet   Comprehensive metabolic panel   Insulin, random   Hemoglobin A1c   Hypertension - Primary    Well controlled, no changes to meds. Encouraged heart healthy diet such as the DASH diet and exercise as tolerated.       Relevant Orders   Lipid panel   CBC with Differential/Platelet   Comprehensive metabolic panel   Insulin, random   Hemoglobin A1c    Other Visit Diagnoses    Colon cancer screening       Relevant Orders   Ambulatory referral to Gastroenterology      Follow-up: Return in about 6 months (around 06/02/2020), or if symptoms worsen or fail to improve, for hypertension, hyperlipidemia, diabetes II.  Donato Schultz, DO

## 2019-12-03 NOTE — Assessment & Plan Note (Addendum)
Well controlled, no changes to meds. Encouraged heart healthy diet such as the DASH diet and exercise as tolerated.   con't coreg

## 2019-12-05 LAB — CBC WITH DIFFERENTIAL/PLATELET
Absolute Monocytes: 481 cells/uL (ref 200–950)
Basophils Absolute: 78 cells/uL (ref 0–200)
Basophils Relative: 1.2 %
Eosinophils Absolute: 221 cells/uL (ref 15–500)
Eosinophils Relative: 3.4 %
HCT: 42 % (ref 38.5–50.0)
Hemoglobin: 14.3 g/dL (ref 13.2–17.1)
Lymphs Abs: 1411 cells/uL (ref 850–3900)
MCH: 29.9 pg (ref 27.0–33.0)
MCHC: 34 g/dL (ref 32.0–36.0)
MCV: 87.7 fL (ref 80.0–100.0)
MPV: 10 fL (ref 7.5–12.5)
Monocytes Relative: 7.4 %
Neutro Abs: 4310 cells/uL (ref 1500–7800)
Neutrophils Relative %: 66.3 %
Platelets: 301 10*3/uL (ref 140–400)
RBC: 4.79 10*6/uL (ref 4.20–5.80)
RDW: 12.3 % (ref 11.0–15.0)
Total Lymphocyte: 21.7 %
WBC: 6.5 10*3/uL (ref 3.8–10.8)

## 2019-12-05 LAB — COMPREHENSIVE METABOLIC PANEL
AG Ratio: 2.2 (calc) (ref 1.0–2.5)
ALT: 14 U/L (ref 9–46)
AST: 17 U/L (ref 10–35)
Albumin: 4.6 g/dL (ref 3.6–5.1)
Alkaline phosphatase (APISO): 32 U/L — ABNORMAL LOW (ref 35–144)
BUN: 15 mg/dL (ref 7–25)
CO2: 28 mmol/L (ref 20–32)
Calcium: 10.2 mg/dL (ref 8.6–10.3)
Chloride: 104 mmol/L (ref 98–110)
Creat: 0.78 mg/dL (ref 0.70–1.18)
Globulin: 2.1 g/dL (calc) (ref 1.9–3.7)
Glucose, Bld: 131 mg/dL — ABNORMAL HIGH (ref 65–99)
Potassium: 4.2 mmol/L (ref 3.5–5.3)
Sodium: 140 mmol/L (ref 135–146)
Total Bilirubin: 0.6 mg/dL (ref 0.2–1.2)
Total Protein: 6.7 g/dL (ref 6.1–8.1)

## 2019-12-05 LAB — INSULIN, RANDOM: Insulin: 2.3 u[IU]/mL

## 2019-12-05 LAB — LIPID PANEL
Cholesterol: 137 mg/dL (ref <200)
HDL: 65 mg/dL (ref >=40)
LDL Cholesterol (Calc): 59 mg/dL (calc)
Non-HDL Cholesterol (Calc): 72 mg/dL (calc) (ref <130)
Total CHOL/HDL Ratio: 2.1 (calc) (ref <5.0)
Triglycerides: 54 mg/dL (ref <150)

## 2019-12-05 LAB — HEMOGLOBIN A1C
Hgb A1c MFr Bld: 6.5 % of total Hgb — ABNORMAL HIGH (ref <5.7)
Mean Plasma Glucose: 140 (calc)
eAG (mmol/L): 7.7 (calc)

## 2019-12-11 ENCOUNTER — Ambulatory Visit (INDEPENDENT_AMBULATORY_CARE_PROVIDER_SITE_OTHER): Payer: Medicare Other | Admitting: Family Medicine

## 2019-12-11 ENCOUNTER — Other Ambulatory Visit: Payer: Self-pay

## 2019-12-11 ENCOUNTER — Encounter (INDEPENDENT_AMBULATORY_CARE_PROVIDER_SITE_OTHER): Payer: Self-pay | Admitting: Family Medicine

## 2019-12-11 VITALS — BP 111/66 | HR 65 | Temp 98.2°F | Ht 69.0 in | Wt 215.0 lb

## 2019-12-11 DIAGNOSIS — E1169 Type 2 diabetes mellitus with other specified complication: Secondary | ICD-10-CM

## 2019-12-11 DIAGNOSIS — E669 Obesity, unspecified: Secondary | ICD-10-CM | POA: Diagnosis not present

## 2019-12-11 DIAGNOSIS — I2510 Atherosclerotic heart disease of native coronary artery without angina pectoris: Secondary | ICD-10-CM

## 2019-12-11 DIAGNOSIS — Z6831 Body mass index (BMI) 31.0-31.9, adult: Secondary | ICD-10-CM

## 2019-12-11 DIAGNOSIS — E785 Hyperlipidemia, unspecified: Secondary | ICD-10-CM | POA: Diagnosis not present

## 2019-12-11 DIAGNOSIS — E1165 Type 2 diabetes mellitus with hyperglycemia: Secondary | ICD-10-CM | POA: Diagnosis not present

## 2019-12-11 DIAGNOSIS — E66811 Obesity, class 1: Secondary | ICD-10-CM

## 2019-12-16 NOTE — Progress Notes (Signed)
Chief Complaint:   OBESITY Zachary Michael is here to discuss his progress with his obesity treatment plan along with follow-up of his obesity related diagnoses. Zachary Michael is on keeping a food journal and adhering to recommended goals of 1400-1700 calories and 90 grams of protein and states he is following his eating plan approximately 100% of the time. Zachary Michael states he is walking the dog for 30 minutes 7 times per week.  Today's visit was #: 35 Starting weight: 301 lbs Starting date: 01/24/2018 Today's weight: 215 lbs Today's date: 12/11/2019 Total lbs lost to date: 86 lbs Total lbs lost since last in-office visit: 2 lbs  Interim History: Zachary Michael has been journaling all day everyday.  He has easily been achieving his protein goal and calories have ranged from 1400-1600 daily.  He endorses hunger when the time between meals is too lengthy.  He will eat toast, yogurt, or cottage cheese if hungry.  Subjective:   1. Type 2 diabetes mellitus with hyperglycemia, without long-term current use of insulin (HCC) A1c 6.5, insulin 23.  He is on metformin 1000 mg twice daily.  Lab Results  Component Value Date   HGBA1C 6.5 (H) 12/03/2019   HGBA1C 6.8 (H) 05/28/2019   HGBA1C 7.1 (H) 03/07/2019   Lab Results  Component Value Date   MICROALBUR 1.2 11/06/2014   LDLCALC 59 12/03/2019   CREATININE 0.78 12/03/2019   Lab Results  Component Value Date   INSULIN 6.3 03/07/2019   INSULIN 2.5 (L) 10/04/2018   INSULIN 4.3 05/07/2018   INSULIN 7.9 01/24/2018   2. Hyperlipidemia associated with type 2 diabetes mellitus (HCC) Shiven has hyperlipidemia and has been trying to improve his cholesterol levels with intensive lifestyle modification including a low saturated fat diet, exercise and weight loss. He denies any chest pain, claudication or myalgias.  recent LDL 59, HDL 65, triglycerides 54.  He is taking fenofibrate 160 mg daily and Zocor 40 mg daily.  Lab Results  Component Value Date   ALT 14  12/03/2019   AST 17 12/03/2019   ALKPHOS 35 (L) 05/28/2019   BILITOT 0.6 12/03/2019   Lab Results  Component Value Date   CHOL 137 12/03/2019   HDL 65 12/03/2019   LDLCALC 59 12/03/2019   LDLDIRECT 198.6 03/20/2009   TRIG 54 12/03/2019   CHOLHDL 2.1 12/03/2019   Assessment/Plan:   1. Type 2 diabetes mellitus with hyperglycemia, without long-term current use of insulin (HCC) Good blood sugar control is important to decrease the likelihood of diabetic complications such as nephropathy, neuropathy, limb loss, blindness, coronary artery disease, and death. Intensive lifestyle modification including diet, exercise and weight loss are the first line of treatment for diabetes.  Continue current medication with no change in dose or meal plan.  2. Hyperlipidemia associated with type 2 diabetes mellitus (HCC) Cardiovascular risk and specific lipid/LDL goals reviewed.  We discussed several lifestyle modifications today and Francisca will continue to work on diet, exercise and weight loss efforts. Orders and follow up as documented in patient record.  Continue statin with no change in dose.  Counseling Intensive lifestyle modifications are the first line treatment for this issue. . Dietary changes: Increase soluble fiber. Decrease simple carbohydrates. . Exercise changes: Moderate to vigorous-intensity aerobic activity 150 minutes per week if tolerated. . Lipid-lowering medications: see documented in medical record.  3. Class 1 obesity with serious comorbidity and body mass index (BMI) of 31.0 to 31.9 in adult, unspecified obesity type  Zachary Michael is currently in the  action stage of change. As such, his goal is to continue with weight loss efforts. He has agreed to keeping a food journal and adhering to recommended goals of 1400-1700 calories and 90+ grams of protein daily.   Exercise goals: As is.  Behavioral modification strategies: increasing lean protein intake, meal planning and cooking  strategies, keeping healthy foods in the home and keeping a strict food journal.  Zachary Michael has agreed to follow-up with our clinic in 4 weeks. He was informed of the importance of frequent follow-up visits to maximize his success with intensive lifestyle modifications for his multiple health conditions.   Objective:   Blood pressure 111/66, pulse 65, temperature 98.2 F (36.8 C), temperature source Oral, height 5\' 9"  (1.753 m), weight 215 lb (97.5 kg), SpO2 98 %. Body mass index is 31.75 kg/m.  General: Cooperative, alert, well developed, in no acute distress. HEENT: Conjunctivae and lids unremarkable. Cardiovascular: Regular rhythm.  Lungs: Normal work of breathing. Neurologic: No focal deficits.   Lab Results  Component Value Date   CREATININE 0.78 12/03/2019   BUN 15 12/03/2019   NA 140 12/03/2019   K 4.2 12/03/2019   CL 104 12/03/2019   CO2 28 12/03/2019   Lab Results  Component Value Date   ALT 14 12/03/2019   AST 17 12/03/2019   ALKPHOS 35 (L) 05/28/2019   BILITOT 0.6 12/03/2019   Lab Results  Component Value Date   HGBA1C 6.5 (H) 12/03/2019   HGBA1C 6.8 (H) 05/28/2019   HGBA1C 7.1 (H) 03/07/2019   HGBA1C 6.5 (H) 10/04/2018   HGBA1C 6.4 (H) 05/07/2018   Lab Results  Component Value Date   INSULIN 6.3 03/07/2019   INSULIN 2.5 (L) 10/04/2018   INSULIN 4.3 05/07/2018   INSULIN 7.9 01/24/2018   Lab Results  Component Value Date   TSH 1.510 03/07/2019   Lab Results  Component Value Date   CHOL 137 12/03/2019   HDL 65 12/03/2019   LDLCALC 59 12/03/2019   LDLDIRECT 198.6 03/20/2009   TRIG 54 12/03/2019   CHOLHDL 2.1 12/03/2019   Lab Results  Component Value Date   WBC 6.5 12/03/2019   HGB 14.3 12/03/2019   HCT 42.0 12/03/2019   MCV 87.7 12/03/2019   PLT 301 12/03/2019   Lab Results  Component Value Date   IRON 82 03/07/2019   TIBC 375 03/07/2019   FERRITIN 161 03/07/2019   Attestation Statements:   Reviewed by clinician on day of visit:  allergies, medications, problem list, medical history, surgical history, family history, social history, and previous encounter notes.  Time spent on visit including pre-visit chart review and post-visit care and charting was 15 minutes.   I, 05/05/2019, CMA, am acting as transcriptionist for Insurance claims handler, MD.  I have reviewed the above documentation for accuracy and completeness, and I agree with the above. - Reuben Likes, MD

## 2020-01-07 ENCOUNTER — Other Ambulatory Visit (INDEPENDENT_AMBULATORY_CARE_PROVIDER_SITE_OTHER): Payer: Self-pay | Admitting: Family Medicine

## 2020-01-07 ENCOUNTER — Encounter (INDEPENDENT_AMBULATORY_CARE_PROVIDER_SITE_OTHER): Payer: Self-pay

## 2020-01-07 DIAGNOSIS — E1169 Type 2 diabetes mellitus with other specified complication: Secondary | ICD-10-CM

## 2020-01-07 NOTE — Telephone Encounter (Signed)
This patient was last seen by Dr. Lawson Radar, and currently has an upcoming appt scheduled on 01/09/20 with Dr. Dalbert Garnet.

## 2020-01-08 NOTE — Telephone Encounter (Signed)
Message sent to pt-CAS 

## 2020-01-09 ENCOUNTER — Encounter (INDEPENDENT_AMBULATORY_CARE_PROVIDER_SITE_OTHER): Payer: Self-pay | Admitting: Family Medicine

## 2020-01-09 ENCOUNTER — Other Ambulatory Visit: Payer: Self-pay

## 2020-01-09 ENCOUNTER — Ambulatory Visit (INDEPENDENT_AMBULATORY_CARE_PROVIDER_SITE_OTHER): Payer: Medicare Other | Admitting: Family Medicine

## 2020-01-09 VITALS — BP 102/63 | HR 53 | Temp 97.5°F | Ht 69.0 in | Wt 214.0 lb

## 2020-01-09 DIAGNOSIS — Z6831 Body mass index (BMI) 31.0-31.9, adult: Secondary | ICD-10-CM | POA: Diagnosis not present

## 2020-01-09 DIAGNOSIS — E669 Obesity, unspecified: Secondary | ICD-10-CM | POA: Diagnosis not present

## 2020-01-09 DIAGNOSIS — R7303 Prediabetes: Secondary | ICD-10-CM | POA: Diagnosis not present

## 2020-01-09 DIAGNOSIS — E1165 Type 2 diabetes mellitus with hyperglycemia: Secondary | ICD-10-CM

## 2020-01-09 NOTE — Progress Notes (Addendum)
Chief Complaint:   OBESITY Connie is here to discuss his progress with his obesity treatment plan along with follow-up of his obesity related diagnoses. Calob is on keeping a food journal and adhering to recommended goals of 1400-1700 calories and 90+ grams of protein daily and states he is following his eating plan approximately 95% of the time. Cordaryl states he is doing yard work, and is walking the dog for 60 minutes 7 times per week.  Today's visit was #: 36 Starting weight: 301 lbs Starting date: 01/24/2018 Today's weight: 214 lbs Today's date: 01/30/2020 Total lbs lost to date: 87 Total lbs lost since last in-office visit: 1  Interim History: Arbie continues to do well with weight loss with journaling, and he is staying active with yard work and walking his dog. He meets his calorie and protein goals most days and his energy is good.  Subjective:   1. diabetes Deran continues to do well with weight loss and diet. He denies problems with metformin, and notes decreased polyphagia.  Assessment/Plan:   1. diabetes Coleston will continue medications, diet, exercise, and  weight loss and decreasing simple carbohydrates to help decrease the risk of diabetes. He will concentrate on strengthening exercises to help improve core strength and decrease visceral fat.  2. Class 1 obesity with serious comorbidity and body mass index (BMI) of 31.0 to 31.9 in adult, unspecified obesity type Jaquavian is currently in the action stage of change. As such, his goal is to continue with weight loss efforts. He has agreed to keeping a food journal and adhering to recommended goals of 1500 calories and 100+ grams of protein daily.   Exercise goals: As is.  Behavioral modification strategies: increasing lean protein intake and holiday eating strategies .  Jushua has agreed to follow-up with our clinic in 3 weeks. He was informed of the importance of frequent follow-up visits to maximize his  success with intensive lifestyle modifications for his multiple health conditions.   Objective:   Blood pressure 102/63, pulse (!) 53, temperature (!) 97.5 F (36.4 C), height 5\' 9"  (1.753 m), weight 214 lb (97.1 kg), SpO2 98 %. Body mass index is 31.6 kg/m.  General: Cooperative, alert, well developed, in no acute distress. HEENT: Conjunctivae and lids unremarkable. Cardiovascular: Regular rhythm.  Lungs: Normal work of breathing. Neurologic: No focal deficits.   Lab Results  Component Value Date   CREATININE 0.78 12/03/2019   BUN 15 12/03/2019   NA 140 12/03/2019   K 4.2 12/03/2019   CL 104 12/03/2019   CO2 28 12/03/2019   Lab Results  Component Value Date   ALT 14 12/03/2019   AST 17 12/03/2019   ALKPHOS 35 (L) 05/28/2019   BILITOT 0.6 12/03/2019   Lab Results  Component Value Date   HGBA1C 6.5 (H) 12/03/2019   HGBA1C 6.8 (H) 05/28/2019   HGBA1C 7.1 (H) 03/07/2019   HGBA1C 6.5 (H) 10/04/2018   HGBA1C 6.4 (H) 05/07/2018   Lab Results  Component Value Date   INSULIN 6.3 03/07/2019   INSULIN 2.5 (L) 10/04/2018   INSULIN 4.3 05/07/2018   INSULIN 7.9 01/24/2018   Lab Results  Component Value Date   TSH 1.510 03/07/2019   Lab Results  Component Value Date   CHOL 137 12/03/2019   HDL 65 12/03/2019   LDLCALC 59 12/03/2019   LDLDIRECT 198.6 03/20/2009   TRIG 54 12/03/2019   CHOLHDL 2.1 12/03/2019   Lab Results  Component Value Date   WBC  6.5 12/03/2019   HGB 14.3 12/03/2019   HCT 42.0 12/03/2019   MCV 87.7 12/03/2019   PLT 301 12/03/2019   Lab Results  Component Value Date   IRON 82 03/07/2019   TIBC 375 03/07/2019   FERRITIN 161 03/07/2019   Attestation Statements:   Reviewed by clinician on day of visit: allergies, medications, problem list, medical history, surgical history, family history, social history, and previous encounter notes.  Time spent on visit including pre-visit chart review and post-visit care and charting was 35 minutes.     I, Burt Knack, am acting as transcriptionist for Quillian Quince, MD.  I have reviewed the above documentation for accuracy and completeness, and I agree with the above. -  Quillian Quince, MD

## 2020-01-30 ENCOUNTER — Ambulatory Visit (INDEPENDENT_AMBULATORY_CARE_PROVIDER_SITE_OTHER): Payer: Medicare Other | Admitting: Family Medicine

## 2020-01-30 ENCOUNTER — Other Ambulatory Visit: Payer: Self-pay

## 2020-01-30 ENCOUNTER — Encounter (INDEPENDENT_AMBULATORY_CARE_PROVIDER_SITE_OTHER): Payer: Self-pay | Admitting: Family Medicine

## 2020-01-30 VITALS — BP 111/65 | HR 62 | Temp 97.5°F | Ht 69.0 in | Wt 213.0 lb

## 2020-01-30 DIAGNOSIS — E1165 Type 2 diabetes mellitus with hyperglycemia: Secondary | ICD-10-CM | POA: Diagnosis not present

## 2020-01-30 DIAGNOSIS — E7849 Other hyperlipidemia: Secondary | ICD-10-CM | POA: Diagnosis not present

## 2020-01-30 DIAGNOSIS — E669 Obesity, unspecified: Secondary | ICD-10-CM

## 2020-01-30 DIAGNOSIS — Z6831 Body mass index (BMI) 31.0-31.9, adult: Secondary | ICD-10-CM | POA: Diagnosis not present

## 2020-01-30 DIAGNOSIS — I10 Essential (primary) hypertension: Secondary | ICD-10-CM

## 2020-01-30 DIAGNOSIS — R7303 Prediabetes: Secondary | ICD-10-CM | POA: Insufficient documentation

## 2020-02-01 ENCOUNTER — Other Ambulatory Visit (INDEPENDENT_AMBULATORY_CARE_PROVIDER_SITE_OTHER): Payer: Self-pay | Admitting: Family Medicine

## 2020-02-01 DIAGNOSIS — E1169 Type 2 diabetes mellitus with other specified complication: Secondary | ICD-10-CM

## 2020-02-04 ENCOUNTER — Other Ambulatory Visit (INDEPENDENT_AMBULATORY_CARE_PROVIDER_SITE_OTHER): Payer: Self-pay | Admitting: Family Medicine

## 2020-02-04 DIAGNOSIS — E7849 Other hyperlipidemia: Secondary | ICD-10-CM

## 2020-02-04 DIAGNOSIS — E1165 Type 2 diabetes mellitus with hyperglycemia: Secondary | ICD-10-CM

## 2020-02-04 MED ORDER — SIMVASTATIN 40 MG PO TABS: 40.0000 mg | ORAL_TABLET | Freq: Every day | ORAL | 0 refills | Status: DC

## 2020-02-04 MED ORDER — METFORMIN HCL 1000 MG PO TABS: 1000.0000 mg | ORAL_TABLET | Freq: Two times a day (BID) | ORAL | 0 refills | Status: DC

## 2020-02-04 MED ORDER — FENOFIBRATE 160 MG PO TABS: 160.0000 mg | ORAL_TABLET | Freq: Every day | ORAL | 0 refills | Status: DC

## 2020-02-04 MED ORDER — CARVEDILOL 3.125 MG PO TABS: 3.1250 mg | ORAL_TABLET | Freq: Every day | ORAL | 0 refills | Status: DC

## 2020-02-04 NOTE — Telephone Encounter (Signed)
Dr.Beasley 

## 2020-02-04 NOTE — Progress Notes (Signed)
Chief Complaint:   OBESITY Zachary Michael is here to discuss his progress with his obesity treatment plan along with follow-up of his obesity related diagnoses. Zachary Michael is on keeping a food journal and adhering to recommended goals of 1500 calories and 100+ grams of protein daily and states he is following his eating plan approximately 95% of the time. Zachary Michael states he is walking for 30 minutes 5 times per week, and doing yard work for 1 hour.  Today's visit was #: 37 Starting weight: 301 lbs Starting date: 01/24/2018 Today's weight: 213 lbs Today's date: 01/30/2020 Total lbs lost to date: 88 Total lbs lost since last in-office visit: 1  Interim History: Zachary Michael has done well avoiding holiday weight gain. He has had a back injury which has affected his activity, but he has still been doing yard work (raking leaves, Catering manager).  Subjective:   1. Type 2 diabetes mellitus with hyperglycemia, without long-term current use of insulin (HCC) Zachary Michael is stable on metformin. He did not bring in his BGs log today. He is working on diet and weight loss.  2. Other hyperlipidemia Zachary Michael is stable on his medications, and he is working on weight loss. He requests a refill today.  3. Essential hypertension Zachary Michael increased Carvedilol to 1 PO q daily on his own, as his blood pressure has increased. He notes some episodes of feeling positional lightheadedness, and he has not been working on increasing his water intake.  Assessment/Plan:   1. Type 2 diabetes mellitus with hyperglycemia, without long-term current use of insulin (HCC) Good blood sugar control is important to decrease the likelihood of diabetic complications such as nephropathy, neuropathy, limb loss, blindness, coronary artery disease, and death. Intensive lifestyle modification including diet, exercise and weight loss are the first line of treatment for diabetes. Zachary Michael will continue his medications, and we will refill metformin for 1  month.  - metFORMIN (GLUCOPHAGE) 1000 MG tablet; Take 1 tablet (1,000 mg total) by mouth 2 (two) times daily with a meal.  Dispense: 60 tablet; Refill: 0  2. Other hyperlipidemia Cardiovascular risk and specific lipid/LDL goals reviewed. We discussed several lifestyle modifications today. Zachary Michael will continue to work on diet, exercise and weight loss efforts. We will refill both fenofibrate and Zocor for 1 month. Orders and follow up as documented in patient record.   - fenofibrate 160 MG tablet; Take 1 tablet (160 mg total) by mouth daily.  Dispense: 30 tablet; Refill: 0 - simvastatin (ZOCOR) 40 MG tablet; Take 1 tablet (40 mg total) by mouth daily at 6 PM.  Dispense: 30 tablet; Refill: 0  3. Essential hypertension Zachary Michael is working on healthy weight loss and exercise to improve blood pressure control. We will watch for signs of hypotension as he continues his lifestyle modifications. Zachary Michael agreed to increase Carvedilol to 1 tablet q daily and we will refill for 1 month. He is to increase his water intake to at least 80+ oz daily, and will follow up as directed.  - carvedilol (COREG) 3.125 MG tablet; Take 1 tablet (3.125 mg total) by mouth daily.  Dispense: 30 tablet; Refill: 0  4. Class 1 obesity with serious comorbidity and body mass index (BMI) of 31.0 to 31.9 in adult, unspecified obesity type Zachary Michael is currently in the action stage of change. As such, his goal is to continue with weight loss efforts. He has agreed to keeping a food journal and adhering to recommended goals of 1500 calories and 100+ grams of protein daily.  Exercise goals: As is.  Behavioral modification strategies: increasing water intake and holiday eating strategies .  Zachary Michael has agreed to follow-up with our clinic in 2 to 3 weeks. He was informed of the importance of frequent follow-up visits to maximize his success with intensive lifestyle modifications for his multiple health conditions.   Objective:    Blood pressure 111/65, pulse 62, temperature (!) 97.5 F (36.4 C), height 5\' 9"  (1.753 m), weight 213 lb (96.6 kg), SpO2 97 %. Body mass index is 31.45 kg/m.  General: Cooperative, alert, well developed, in no acute distress. HEENT: Conjunctivae and lids unremarkable. Cardiovascular: Regular rhythm.  Lungs: Normal work of breathing. Neurologic: No focal deficits.   Lab Results  Component Value Date   CREATININE 0.78 12/03/2019   BUN 15 12/03/2019   NA 140 12/03/2019   K 4.2 12/03/2019   CL 104 12/03/2019   CO2 28 12/03/2019   Lab Results  Component Value Date   ALT 14 12/03/2019   AST 17 12/03/2019   ALKPHOS 35 (L) 05/28/2019   BILITOT 0.6 12/03/2019   Lab Results  Component Value Date   HGBA1C 6.5 (H) 12/03/2019   HGBA1C 6.8 (H) 05/28/2019   HGBA1C 7.1 (H) 03/07/2019   HGBA1C 6.5 (H) 10/04/2018   HGBA1C 6.4 (H) 05/07/2018   Lab Results  Component Value Date   INSULIN 6.3 03/07/2019   INSULIN 2.5 (L) 10/04/2018   INSULIN 4.3 05/07/2018   INSULIN 7.9 01/24/2018   Lab Results  Component Value Date   TSH 1.510 03/07/2019   Lab Results  Component Value Date   CHOL 137 12/03/2019   HDL 65 12/03/2019   LDLCALC 59 12/03/2019   LDLDIRECT 198.6 03/20/2009   TRIG 54 12/03/2019   CHOLHDL 2.1 12/03/2019   Lab Results  Component Value Date   WBC 6.5 12/03/2019   HGB 14.3 12/03/2019   HCT 42.0 12/03/2019   MCV 87.7 12/03/2019   PLT 301 12/03/2019   Lab Results  Component Value Date   IRON 82 03/07/2019   TIBC 375 03/07/2019   FERRITIN 161 03/07/2019    Obesity Behavioral Intervention:   Approximately 15 minutes were spent on the discussion below.  ASK: We discussed the diagnosis of obesity with 05/05/2019 today and Zachary Michael agreed to give Zachary Michael permission to discuss obesity behavioral modification therapy today.  ASSESS: Zachary Michael has the diagnosis of obesity and his BMI today is 31.44. Zachary Michael is in the action stage of change.   ADVISE: Zachary Michael was  educated on the multiple health risks of obesity as well as the benefit of weight loss to improve his health. He was advised of the need for long term treatment and the importance of lifestyle modifications to improve his current health and to decrease his risk of future health problems.  AGREE: Multiple dietary modification options and treatment options were discussed and Zachary Michael agreed to follow the recommendations documented in the above note.  ARRANGE: Zachary Michael was educated on the importance of frequent visits to treat obesity as outlined per CMS and USPSTF guidelines and agreed to schedule his next follow up appointment today.  Attestation Statements:   Reviewed by clinician on day of visit: allergies, medications, problem list, medical history, surgical history, family history, social history, and previous encounter notes.   I, Zachary Michael, am acting as transcriptionist for Burt Knack, MD.  I have reviewed the above documentation for accuracy and completeness, and I agree with the above. -  Quillian Quince, MD

## 2020-02-04 NOTE — Telephone Encounter (Signed)
Medications have already been filled today.

## 2020-02-18 ENCOUNTER — Encounter (INDEPENDENT_AMBULATORY_CARE_PROVIDER_SITE_OTHER): Payer: Self-pay | Admitting: Family Medicine

## 2020-02-18 ENCOUNTER — Other Ambulatory Visit: Payer: Self-pay

## 2020-02-18 ENCOUNTER — Ambulatory Visit (INDEPENDENT_AMBULATORY_CARE_PROVIDER_SITE_OTHER): Payer: Medicare Other | Admitting: Family Medicine

## 2020-02-18 VITALS — BP 112/55 | HR 61 | Temp 97.7°F | Ht 69.0 in | Wt 209.0 lb

## 2020-02-18 DIAGNOSIS — E669 Obesity, unspecified: Secondary | ICD-10-CM

## 2020-02-18 DIAGNOSIS — E7849 Other hyperlipidemia: Secondary | ICD-10-CM | POA: Diagnosis not present

## 2020-02-18 DIAGNOSIS — Z6831 Body mass index (BMI) 31.0-31.9, adult: Secondary | ICD-10-CM

## 2020-02-19 NOTE — Progress Notes (Signed)
Chief Complaint:   OBESITY Zachary Michael is here to discuss his progress with his obesity treatment plan along with follow-up of his obesity related diagnoses. Zachary Michael is on keeping a food journal and adhering to recommended goals of 1500 calories and 100+ grams of protein daily and states he is following his eating plan approximately 95% of the time. Zachary Michael states he is doing yard work and walking for 30-60 minutes 5 times per week.  Today's visit was #: 38 Starting weight: 301 lbs Starting date: 01/24/2018 Today's weight: 209 lbs Today's date: 02/18/2020 Total lbs lost to date: 92 Total lbs lost since last in-office visit: 4  Interim History: Zachary Michael has done well with weight loss and journaling well, and he does well meeting his protein goals. He is not always eating enough calories which could decrease his RMR and stop his weight loss.  Subjective:   1. Other hyperlipidemia Zachary Michael is doing very well with diet and exercise. He is stable Zocor, and he denies signs of chest pain or myalgias.  Assessment/Plan:   1. Other hyperlipidemia Cardiovascular risk and specific lipid/LDL goals reviewed. We discussed several lifestyle modifications today. Zachary Michael will continue his medications, and will continue to work on diet, exercise and weight loss efforts. Orders and follow up as documented in patient record.   2. Class 1 obesity with serious comorbidity and body mass index (BMI) of 31.0 to 31.9 in adult, unspecified obesity type Zachary Michael is currently in the action stage of change. As such, his goal is to continue with weight loss efforts. He has agreed to keeping a food journal and adhering to recommended goals of 1500 calories and 100+ grams of protein daily.   Exercise goals: As is.  Behavioral modification strategies: meal planning and cooking strategies and holiday eating strategies .  Zachary Michael has agreed to follow-up with our clinic in 4 weeks. He was informed of the importance of  frequent follow-up visits to maximize his success with intensive lifestyle modifications for his multiple health conditions.   Objective:   Blood pressure (!) 112/55, pulse 61, temperature 97.7 F (36.5 C), height 5\' 9"  (1.753 m), weight 209 lb (94.8 kg), SpO2 97 %. Body mass index is 30.86 kg/m.  General: Cooperative, alert, well developed, in no acute distress. HEENT: Conjunctivae and lids unremarkable. Cardiovascular: Regular rhythm.  Lungs: Normal work of breathing. Neurologic: No focal deficits.   Lab Results  Component Value Date   CREATININE 0.78 12/03/2019   BUN 15 12/03/2019   NA 140 12/03/2019   K 4.2 12/03/2019   CL 104 12/03/2019   CO2 28 12/03/2019   Lab Results  Component Value Date   ALT 14 12/03/2019   AST 17 12/03/2019   ALKPHOS 35 (L) 05/28/2019   BILITOT 0.6 12/03/2019   Lab Results  Component Value Date   HGBA1C 6.5 (H) 12/03/2019   HGBA1C 6.8 (H) 05/28/2019   HGBA1C 7.1 (H) 03/07/2019   HGBA1C 6.5 (H) 10/04/2018   HGBA1C 6.4 (H) 05/07/2018   Lab Results  Component Value Date   INSULIN 6.3 03/07/2019   INSULIN 2.5 (L) 10/04/2018   INSULIN 4.3 05/07/2018   INSULIN 7.9 01/24/2018   Lab Results  Component Value Date   TSH 1.510 03/07/2019   Lab Results  Component Value Date   CHOL 137 12/03/2019   HDL 65 12/03/2019   LDLCALC 59 12/03/2019   LDLDIRECT 198.6 03/20/2009   TRIG 54 12/03/2019   CHOLHDL 2.1 12/03/2019   Lab Results  Component  Value Date   WBC 6.5 12/03/2019   HGB 14.3 12/03/2019   HCT 42.0 12/03/2019   MCV 87.7 12/03/2019   PLT 301 12/03/2019   Lab Results  Component Value Date   IRON 82 03/07/2019   TIBC 375 03/07/2019   FERRITIN 161 03/07/2019   Attestation Statements:   Reviewed by clinician on day of visit: allergies, medications, problem list, medical history, surgical history, family history, social history, and previous encounter notes.  Time spent on visit including pre-visit chart review and post-visit  care and charting was 30 minutes.    I, Burt Knack, am acting as transcriptionist for Quillian Quince, MD.  I have reviewed the above documentation for accuracy and completeness, and I agree with the above. -  Quillian Quince, MD

## 2020-03-02 ENCOUNTER — Other Ambulatory Visit (INDEPENDENT_AMBULATORY_CARE_PROVIDER_SITE_OTHER): Payer: Self-pay | Admitting: Family Medicine

## 2020-03-02 DIAGNOSIS — I10 Essential (primary) hypertension: Secondary | ICD-10-CM

## 2020-03-03 ENCOUNTER — Ambulatory Visit (INDEPENDENT_AMBULATORY_CARE_PROVIDER_SITE_OTHER): Payer: Medicare Other | Admitting: Family Medicine

## 2020-03-03 ENCOUNTER — Encounter (INDEPENDENT_AMBULATORY_CARE_PROVIDER_SITE_OTHER): Payer: Self-pay | Admitting: Family Medicine

## 2020-03-03 ENCOUNTER — Other Ambulatory Visit: Payer: Self-pay

## 2020-03-03 VITALS — BP 105/59 | HR 61 | Temp 97.7°F | Ht 69.0 in | Wt 214.0 lb

## 2020-03-03 DIAGNOSIS — I152 Hypertension secondary to endocrine disorders: Secondary | ICD-10-CM

## 2020-03-03 DIAGNOSIS — Z6831 Body mass index (BMI) 31.0-31.9, adult: Secondary | ICD-10-CM

## 2020-03-03 DIAGNOSIS — E1165 Type 2 diabetes mellitus with hyperglycemia: Secondary | ICD-10-CM

## 2020-03-03 DIAGNOSIS — E669 Obesity, unspecified: Secondary | ICD-10-CM

## 2020-03-03 DIAGNOSIS — E1159 Type 2 diabetes mellitus with other circulatory complications: Secondary | ICD-10-CM | POA: Diagnosis not present

## 2020-03-03 NOTE — Progress Notes (Signed)
Chief Complaint:   OBESITY Zachary Michael is here to discuss his progress with his obesity treatment plan along with follow-up of his obesity related diagnoses. Zachary Michael is on keeping a food journal and adhering to recommended goals of 1500 calories and 100 grams of protein daily and states he is following his eating plan approximately 50% of the time. Zachary Michael states he is walking the dog, and doing yard work for 30-90 minutes 3-4 times per week.  Today's visit was #: 38 Starting weight: 301 lbs Starting date: 01/24/2018 Today's weight: 214 lbs Today's date: 03/03/2020 Total lbs lost to date: 87 lbs Total lbs lost since last in-office visit: 1 lb  Interim History: Zachary Michael mentions he is not eating enough during the day. He is getting slightly less than 1500 calories, and 100 grams of protein. He didn't always track or even monitor food amount . He is getting a good amount of activity in.  Subjective:   1. Type 2 diabetes mellitus with hyperglycemia, without long-term current use of insulin (HCC) Zachary Michael is taking metformin daily. No GI side effects on metformin. Last A1c was 6.5 and insulin 2.3.  2. Hypertension associated with diabetes (HCC) Zachary Michael is on coreg and nitro(as needed). No dizziness, lightedheadness, chest pain or chest pressure. Blood pressure is low today.  BP Readings from Last 3 Encounters:  03/03/20 (!) 105/59  02/18/20 (!) 112/55  01/30/20 111/65     Assessment/Plan:   1. Type 2 diabetes mellitus with hyperglycemia, without long-term current use of insulin (HCC) Good blood sugar control is important to decrease the likelihood of diabetic complications such as nephropathy, neuropathy, limb loss, blindness, coronary artery disease, and death. Intensive lifestyle modification including diet, exercise and weight loss are the first line of treatment for diabetes. Zachary Michael will continue on metformin. No refill needed.  2. Hypertension associated with diabetes  (HCC) Zachary Michael is working on healthy weight loss and exercise to improve blood pressure control. We will watch for signs of hypotension as he continues his lifestyle modifications. Will follow up on blood pressure at next visit.  3. Class 1 obesity with serious comorbidity and body mass index (BMI) of 31.0 to 31.9 in adult, unspecified obesity type  Zachary Michael is currently in the action stage of change. As such, his goal is to continue with weight loss efforts. He has agreed to keeping a food journal and adhering to recommended goals of 1500 calories and 100+ grams of  protein daily.  Exercise goals: As is.  Behavioral modification strategies: increasing lean protein intake, meal planning and cooking strategies, keeping healthy foods in the home, planning for success and keeping a strict food journal.  Zachary Michael has agreed to follow-up with our clinic in 4 weeks. He was informed of the importance of frequent follow-up visits to maximize his success with intensive lifestyle modifications for his multiple health conditions.    Objective:   Blood pressure (!) 105/59, pulse 61, temperature 97.7 F (36.5 C), temperature source Oral, height 5\' 9"  (1.753 m), weight 214 lb (97.1 kg), SpO2 97 %. Body mass index is 31.6 kg/m.  General: Cooperative, alert, well developed, in no acute distress. HEENT: Conjunctivae and lids unremarkable. Cardiovascular: Regular rhythm.  Lungs: Normal work of breathing. Neurologic: No focal deficits.   Lab Results  Component Value Date   CREATININE 0.78 12/03/2019   BUN 15 12/03/2019   NA 140 12/03/2019   K 4.2 12/03/2019   CL 104 12/03/2019   CO2 28 12/03/2019   Lab Results  Component Value Date   ALT 14 12/03/2019   AST 17 12/03/2019   ALKPHOS 35 (L) 05/28/2019   BILITOT 0.6 12/03/2019   Lab Results  Component Value Date   HGBA1C 6.5 (H) 12/03/2019   HGBA1C 6.8 (H) 05/28/2019   HGBA1C 7.1 (H) 03/07/2019   HGBA1C 6.5 (H) 10/04/2018   HGBA1C 6.4 (H)  05/07/2018   Lab Results  Component Value Date   INSULIN 6.3 03/07/2019   INSULIN 2.5 (L) 10/04/2018   INSULIN 4.3 05/07/2018   INSULIN 7.9 01/24/2018   Lab Results  Component Value Date   TSH 1.510 03/07/2019   Lab Results  Component Value Date   CHOL 137 12/03/2019   HDL 65 12/03/2019   LDLCALC 59 12/03/2019   LDLDIRECT 198.6 03/20/2009   TRIG 54 12/03/2019   CHOLHDL 2.1 12/03/2019   Lab Results  Component Value Date   WBC 6.5 12/03/2019   HGB 14.3 12/03/2019   HCT 42.0 12/03/2019   MCV 87.7 12/03/2019   PLT 301 12/03/2019   Lab Results  Component Value Date   IRON 82 03/07/2019   TIBC 375 03/07/2019   FERRITIN 161 03/07/2019    Obesity Behavioral Intervention:   Approximately 15 minutes were spent on the discussion below.  ASK: We discussed the diagnosis of obesity with [name] today and Ludie agreed to give [name] permission to discuss obesity behavioral modification therapy today.  ASSESS: Bedford has the diagnosis of obesity and his BMI today is 31.6. Zaydyn is in the action stage of change.   ADVISE: Montford was educated on the multiple health risks of obesity as well as the benefit of weight loss to improve his health. He was advised of the need for long term treatment and the importance of lifestyle modifications to improve his current health and to decrease his risk of future health problems.  AGREE: Multiple dietary modification options and treatment options were discussed and Khiry agreed to follow the recommendations documented in the above note.  ARRANGE: Tushar was educated on the importance of frequent visits to treat obesity as outlined per CMS and USPSTF guidelines and agreed to schedule his next follow up appointment today.  Attestation Statements:   Reviewed by clinician on day of visit: allergies, medications, problem list, medical history, surgical history, family history, social history, and previous encounter notes.  Time spent on  visit including pre-visit chart review and post-visit care and charting was 15 minutes.   I, Delorse Limber, am acting as transcriptionist for Reuben Likes, MD.  I have reviewed the above documentation for accuracy and completeness, and I agree with the above. - Katherina Mires, MD

## 2020-03-31 ENCOUNTER — Other Ambulatory Visit: Payer: Self-pay

## 2020-03-31 ENCOUNTER — Ambulatory Visit (INDEPENDENT_AMBULATORY_CARE_PROVIDER_SITE_OTHER): Payer: Medicare Other | Admitting: Family Medicine

## 2020-03-31 ENCOUNTER — Encounter (INDEPENDENT_AMBULATORY_CARE_PROVIDER_SITE_OTHER): Payer: Self-pay | Admitting: Family Medicine

## 2020-03-31 VITALS — BP 112/65 | HR 69 | Temp 98.0°F | Ht 69.0 in | Wt 211.0 lb

## 2020-03-31 DIAGNOSIS — E669 Obesity, unspecified: Secondary | ICD-10-CM | POA: Diagnosis not present

## 2020-03-31 DIAGNOSIS — I10 Essential (primary) hypertension: Secondary | ICD-10-CM

## 2020-03-31 DIAGNOSIS — Z6831 Body mass index (BMI) 31.0-31.9, adult: Secondary | ICD-10-CM

## 2020-03-31 DIAGNOSIS — E7849 Other hyperlipidemia: Secondary | ICD-10-CM

## 2020-03-31 MED ORDER — CARVEDILOL 3.125 MG PO TABS: 3.1250 mg | ORAL_TABLET | Freq: Every day | ORAL | 0 refills | Status: DC

## 2020-03-31 MED ORDER — FENOFIBRATE 160 MG PO TABS: 160.0000 mg | ORAL_TABLET | Freq: Every day | ORAL | 0 refills | Status: DC

## 2020-03-31 MED ORDER — SIMVASTATIN 40 MG PO TABS: 40.0000 mg | ORAL_TABLET | Freq: Every day | ORAL | 0 refills | Status: DC

## 2020-04-01 NOTE — Progress Notes (Signed)
Chief Complaint:   OBESITY Zachary Michael is here to discuss his progress with his obesity treatment plan along with follow-up of his obesity related diagnoses. Zachary Michael is on keeping a food journal and adhering to recommended goals of 1500 calories and 100 g protein and states he is following his eating plan approximately 90-95% of the time. Zachary Michael states he is walking the dog and doing yard work 30-60 minutes 3-4 times per week.  Today's visit was #: 39 Starting weight: 301 lbs Starting date: 01/24/2018 Today's weight: 211 lbs Today's date: 03/31/2020 Total lbs lost to date: 90 lbs Total lbs lost since last in-office visit: 3 lbs  Interim History: Pt has journaled everyday. He has been low on protein and calories the last few weeks. Last night pt had fallen asleep early and only ate about 1000 calories and 80 g protein. Really ending up closer to 1500 calories and >100 g protein. With change in weather, pt hasn't been doing as much physical activity.  Subjective:   1. Other hyperlipidemia Pt is on simvastatin and fenofibrate. He denies transaminitis or myalgias. Last LDL 59, HDL 65, and Triglycerides 54.   2. Essential hypertension BP controlled. Pt denies chest pain, chest pressure and headache.  Assessment/Plan:   1. Other hyperlipidemia Cardiovascular risk and specific lipid/LDL goals reviewed.  We discussed several lifestyle modifications today and Zachary Michael will continue to work on diet, exercise and weight loss efforts. Orders and follow up as documented in patient record.   Counseling Intensive lifestyle modifications are the first line treatment for this issue. . Dietary changes: Increase soluble fiber. Decrease simple carbohydrates. . Exercise changes: Moderate to vigorous-intensity aerobic activity 150 minutes per week if tolerated. . Lipid-lowering medications: see documented in medical record.  - simvastatin (ZOCOR) 40 MG tablet; Take 1 tablet (40 mg total) by mouth daily  at 6 PM.  Dispense: 90 tablet; Refill: 0 - fenofibrate 160 MG tablet; Take 1 tablet (160 mg total) by mouth daily.  Dispense: 90 tablet; Refill: 0  2. Essential hypertension Zachary Michael is working on healthy weight loss and exercise to improve blood pressure control. We will watch for signs of hypotension as he continues his lifestyle modifications.  - carvedilol (COREG) 3.125 MG tablet; Take 1 tablet (3.125 mg total) by mouth daily.  Dispense: 90 tablet; Refill: 0  3. Class 1 obesity with serious comorbidity and body mass index (BMI) of 31.0 to 31.9 in adult, unspecified obesity type Zachary Michael is currently in the action stage of change. As such, his goal is to continue with weight loss efforts. He has agreed to keeping a food journal and adhering to recommended goals of 1500 calories and 100+ g protein.   Exercise goals: Increase activity to outdoor activity with slightly warmer weather.  Behavioral modification strategies: increasing lean protein intake, meal planning and cooking strategies, keeping healthy foods in the home and keeping a strict food journal.  Zachary Michael has agreed to follow-up with our clinic in 6 weeks. He was informed of the importance of frequent follow-up visits to maximize his success with intensive lifestyle modifications for his multiple health conditions.   Objective:   Blood pressure 112/65, pulse 69, temperature 98 F (36.7 C), temperature source Oral, height 5\' 9"  (1.753 m), weight 211 lb (95.7 kg), SpO2 97 %. Body mass index is 31.16 kg/m.  General: Cooperative, alert, well developed, in no acute distress. HEENT: Conjunctivae and lids unremarkable. Cardiovascular: Regular rhythm.  Lungs: Normal work of breathing. Neurologic: No focal deficits.  Lab Results  Component Value Date   CREATININE 0.78 12/03/2019   BUN 15 12/03/2019   NA 140 12/03/2019   K 4.2 12/03/2019   CL 104 12/03/2019   CO2 28 12/03/2019   Lab Results  Component Value Date   ALT 14  12/03/2019   AST 17 12/03/2019   ALKPHOS 35 (L) 05/28/2019   BILITOT 0.6 12/03/2019   Lab Results  Component Value Date   HGBA1C 6.5 (H) 12/03/2019   HGBA1C 6.8 (H) 05/28/2019   HGBA1C 7.1 (H) 03/07/2019   HGBA1C 6.5 (H) 10/04/2018   HGBA1C 6.4 (H) 05/07/2018   Lab Results  Component Value Date   INSULIN 6.3 03/07/2019   INSULIN 2.5 (L) 10/04/2018   INSULIN 4.3 05/07/2018   INSULIN 7.9 01/24/2018   Lab Results  Component Value Date   TSH 1.510 03/07/2019   Lab Results  Component Value Date   CHOL 137 12/03/2019   HDL 65 12/03/2019   LDLCALC 59 12/03/2019   LDLDIRECT 198.6 03/20/2009   TRIG 54 12/03/2019   CHOLHDL 2.1 12/03/2019   Lab Results  Component Value Date   WBC 6.5 12/03/2019   HGB 14.3 12/03/2019   HCT 42.0 12/03/2019   MCV 87.7 12/03/2019   PLT 301 12/03/2019   Lab Results  Component Value Date   IRON 82 03/07/2019   TIBC 375 03/07/2019   FERRITIN 161 03/07/2019    Obesity Behavioral Intervention:   Approximately 15 minutes were spent on the discussion below.  ASK: We discussed the diagnosis of obesity with Zachary Michael today and Zachary Michael agreed to give [name] permission to discuss obesity behavioral modification therapy today.  ASSESS: Zachary Michael has the diagnosis of obesity and his BMI today is 31.2. Zachary Michael is in the action stage of change.   ADVISE: Zachary Michael was educated on the multiple health risks of obesity as well as the benefit of weight loss to improve his health. He was advised of the need for long term treatment and the importance of lifestyle modifications to improve his current health and to decrease his risk of future health problems.  AGREE: Multiple dietary modification options and treatment options were discussed and Zachary Michael agreed to follow the recommendations documented in the above note.  ARRANGE: Zachary Michael was educated on the importance of frequent visits to treat obesity as outlined per CMS and USPSTF guidelines and agreed to schedule  his next follow up appointment today.  Attestation Statements:   Reviewed by clinician on day of visit: allergies, medications, problem list, medical history, surgical history, family history, social history, and previous encounter notes.  [name], am acting as transcriptionist for Reuben Likes, MD.  I have reviewed the above documentation for accuracy and completeness, and I agree with the above. - Katherina Mires, MD

## 2020-05-11 ENCOUNTER — Ambulatory Visit (INDEPENDENT_AMBULATORY_CARE_PROVIDER_SITE_OTHER): Payer: Medicare Other | Admitting: Family Medicine

## 2020-05-11 ENCOUNTER — Encounter (INDEPENDENT_AMBULATORY_CARE_PROVIDER_SITE_OTHER): Payer: Self-pay | Admitting: Family Medicine

## 2020-05-11 ENCOUNTER — Other Ambulatory Visit: Payer: Self-pay

## 2020-05-11 VITALS — BP 123/63 | HR 65 | Temp 97.8°F | Ht 69.0 in | Wt 207.0 lb

## 2020-05-11 DIAGNOSIS — I152 Hypertension secondary to endocrine disorders: Secondary | ICD-10-CM | POA: Diagnosis not present

## 2020-05-11 DIAGNOSIS — E7849 Other hyperlipidemia: Secondary | ICD-10-CM

## 2020-05-11 DIAGNOSIS — E1165 Type 2 diabetes mellitus with hyperglycemia: Secondary | ICD-10-CM | POA: Diagnosis not present

## 2020-05-11 DIAGNOSIS — E669 Obesity, unspecified: Secondary | ICD-10-CM

## 2020-05-11 DIAGNOSIS — E1159 Type 2 diabetes mellitus with other circulatory complications: Secondary | ICD-10-CM

## 2020-05-11 DIAGNOSIS — Z6830 Body mass index (BMI) 30.0-30.9, adult: Secondary | ICD-10-CM

## 2020-05-11 MED ORDER — FENOFIBRATE 160 MG PO TABS: 160.0000 mg | ORAL_TABLET | Freq: Every day | ORAL | 0 refills | Status: DC

## 2020-05-11 MED ORDER — METFORMIN HCL 1000 MG PO TABS: 1000.0000 mg | ORAL_TABLET | Freq: Two times a day (BID) | ORAL | 0 refills | Status: DC

## 2020-05-11 MED ORDER — SIMVASTATIN 40 MG PO TABS: 40.0000 mg | ORAL_TABLET | Freq: Every day | ORAL | 0 refills | Status: DC

## 2020-05-11 MED ORDER — CARVEDILOL 3.125 MG PO TABS: 3.1250 mg | ORAL_TABLET | Freq: Every day | ORAL | 0 refills | Status: DC

## 2020-05-13 NOTE — Progress Notes (Signed)
Chief Complaint:   OBESITY Zachary Michael is here to discuss his progress with his obesity treatment plan along with follow-up of his obesity related diagnoses. Zachary Michael is on keeping a food journal and adhering to recommended goals of 1500 calories and 100 g protein and states he is following his eating plan approximately 90% of the time. 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  Today's visit was #: 40 Starting weight: 301 lbs Starting date: 01/24/2018 Today's weight: 207 lbs Today's date: 05/11/2020 Total lbs lost to date: 34 lbs Total lbs lost since last in-office visit: 4 lbs  Interim History: Zachary Michael voices about 1 week ago, he was 5 lbs lighter. He has been really struggling with wife's current back and leg pain issues. He has limited his walking with the dog as his wife can't. Calorie wise, he has been a little low but his protein has stayed steady ~100 g.  Subjective:   1. Other hyperlipidemia Zachary Michael is on fenofibrate and statin therapy. He denies transaminitis.   Lab Results  Component Value Date   ALT 14 12/03/2019   AST 17 12/03/2019   ALKPHOS 35 (L) 05/28/2019   BILITOT 0.6 12/03/2019   Lab Results  Component Value Date   CHOL 137 12/03/2019   HDL 65 12/03/2019   LDLCALC 59 12/03/2019   LDLDIRECT 198.6 03/20/2009   TRIG 54 12/03/2019   CHOLHDL 2.1 12/03/2019    2. Type 2 diabetes mellitus with hyperglycemia, without long-term current use of insulin (HCC) Huxton's last A1c was 6.5. He is on Metformin and denies GI side effects.  Lab Results  Component Value Date   HGBA1C 6.5 (H) 12/03/2019   HGBA1C 6.8 (H) 05/28/2019   HGBA1C 7.1 (H) 03/07/2019   Lab Results  Component Value Date   MICROALBUR 1.2 11/06/2014   LDLCALC 59 12/03/2019   CREATININE 0.78 12/03/2019   Lab Results  Component Value Date   INSULIN 6.3 03/07/2019   INSULIN 2.5 (L) 10/04/2018   INSULIN 4.3 05/07/2018   INSULIN 7.9 01/24/2018    3. Hypertension  associated with diabetes (HCC) Prophet's BP is controlled.  Pt denies chest pain, chest pressure and headache. He is on carvedilol.  BP Readings from Last 3 Encounters:  05/11/20 123/63  03/31/20 112/65  03/03/20 (!) 105/59    Assessment/Plan:   1. Other hyperlipidemia Cardiovascular risk and specific lipid/LDL goals reviewed.  We discussed several lifestyle modifications today and [name] will continue to work on diet, exercise and weight loss efforts. Orders and follow up as documented in patient record.   Counseling Intensive lifestyle modifications are the first line treatment for this issue. . Dietary changes: Increase soluble fiber. Decrease simple carbohydrates. . Exercise changes: Moderate to vigorous-intensity aerobic activity 150 minutes per week if tolerated. . Lipid-lowering medications: see documented in medical record.  - simvastatin (ZOCOR) 40 MG tablet; Take 1 tablet (40 mg total) by mouth daily at 6 PM.  Dispense: 90 tablet; Refill: 0 - fenofibrate 160 MG tablet; Take 1 tablet (160 mg total) by mouth daily.  Dispense: 90 tablet; Refill: 0  2. Type 2 diabetes mellitus with hyperglycemia, without long-term current use of insulin (HCC) Good blood sugar control is important to decrease the likelihood of diabetic complications such as nephropathy, neuropathy, limb loss, blindness, coronary artery disease, and death. Intensive lifestyle modification including diet, exercise and weight loss are the first line of treatment for diabetes.   - metFORMIN (GLUCOPHAGE) 1000 MG tablet; Take 1  tablet (1,000 mg total) by mouth 2 (two) times daily with a meal.  Dispense: 180 tablet; Refill: 0  3. Hypertension associated with diabetes (HCC) Lavoy is working on healthy weight loss and exercise to improve blood pressure control. We will watch for signs of hypotension as he continues his lifestyle modifications.  - carvedilol (COREG) 3.125 MG tablet; Take 1 tablet (3.125 mg total) by  mouth daily.  Dispense: 90 tablet; Refill: 0  4. Class 1 obesity with serious comorbidity and body mass index (BMI) of 30.0 to 30.9 in adult, unspecified obesity type Zachary Michael is currently in the action stage of change. As such, his goal is to continue with weight loss efforts. He has agreed to keeping a food journal and adhering to recommended goals of 1500 calories and 100+ g protein.   Exercise goals: As is  Behavioral modification strategies: increasing lean protein intake, meal planning and cooking strategies, keeping healthy foods in the home and planning for success.  [name] has agreed to follow-up with our clinic in 6-8 weeks. He was informed of the importance of frequent follow-up visits to maximize his success with intensive lifestyle modifications for his multiple health conditions.   Objective:   Blood pressure 123/63, pulse 65, temperature 97.8 F (36.6 C), temperature source Oral, height 5\' 9"  (1.753 m), weight 207 lb (93.9 kg), SpO2 100 %. Body mass index is 30.57 kg/m.  General: Cooperative, alert, well developed, in no acute distress. HEENT: Conjunctivae and lids unremarkable. Cardiovascular: Regular rhythm.  Lungs: Normal work of breathing. Neurologic: No focal deficits.   Lab Results  Component Value Date   CREATININE 0.78 12/03/2019   BUN 15 12/03/2019   NA 140 12/03/2019   K 4.2 12/03/2019   CL 104 12/03/2019   CO2 28 12/03/2019   Lab Results  Component Value Date   ALT 14 12/03/2019   AST 17 12/03/2019   ALKPHOS 35 (L) 05/28/2019   BILITOT 0.6 12/03/2019   Lab Results  Component Value Date   HGBA1C 6.5 (H) 12/03/2019   HGBA1C 6.8 (H) 05/28/2019   HGBA1C 7.1 (H) 03/07/2019   HGBA1C 6.5 (H) 10/04/2018   HGBA1C 6.4 (H) 05/07/2018   Lab Results  Component Value Date   INSULIN 6.3 03/07/2019   INSULIN 2.5 (L) 10/04/2018   INSULIN 4.3 05/07/2018   INSULIN 7.9 01/24/2018   Lab Results  Component Value Date   TSH 1.510 03/07/2019   Lab Results   Component Value Date   CHOL 137 12/03/2019   HDL 65 12/03/2019   LDLCALC 59 12/03/2019   LDLDIRECT 198.6 03/20/2009   TRIG 54 12/03/2019   CHOLHDL 2.1 12/03/2019   Lab Results  Component Value Date   WBC 6.5 12/03/2019   HGB 14.3 12/03/2019   HCT 42.0 12/03/2019   MCV 87.7 12/03/2019   PLT 301 12/03/2019   Lab Results  Component Value Date   IRON 82 03/07/2019   TIBC 375 03/07/2019   FERRITIN 161 03/07/2019    Obesity Behavioral Intervention:   Approximately 15 minutes were spent on the discussion below.  ASK: We discussed the diagnosis of obesity with 05/05/2019 today and Judah agreed to give [name] permission to discuss obesity behavioral modification therapy today.  ASSESS: Darryl has the diagnosis of obesity and his BMI today is 30.6. Larz is in the action stage of change.   ADVISE: Evo was educated on the multiple health risks of obesity as well as the benefit of weight loss to improve his health. He was  advised of the need for long term treatment and the importance of lifestyle modifications to improve his current health and to decrease his risk of future health problems.  AGREE: Multiple dietary modification options and treatment options were discussed and Fread agreed to follow the recommendations documented in the above note.  ARRANGE: Gryphon was educated on the importance of frequent visits to treat obesity as outlined per CMS and USPSTF guidelines and agreed to schedule his next follow up appointment today.  Attestation Statements:   Reviewed by clinician on day of visit: allergies, medications, problem list, medical history, surgical history, family history, social history, and previous encounter notes.  [name], am acting as transcriptionist for Reuben Likes, MD.   I have reviewed the above documentation for accuracy and completeness, and I agree with the above. - Katherina Mires, MD

## 2020-06-02 ENCOUNTER — Ambulatory Visit (INDEPENDENT_AMBULATORY_CARE_PROVIDER_SITE_OTHER): Payer: Medicare Other | Admitting: Family Medicine

## 2020-06-02 ENCOUNTER — Other Ambulatory Visit: Payer: Self-pay

## 2020-06-02 ENCOUNTER — Encounter: Payer: Self-pay | Admitting: Family Medicine

## 2020-06-02 VITALS — BP 110/62 | HR 59 | Temp 97.9°F | Resp 18 | Ht 69.0 in | Wt 218.2 lb

## 2020-06-02 DIAGNOSIS — I10 Essential (primary) hypertension: Secondary | ICD-10-CM | POA: Diagnosis not present

## 2020-06-02 DIAGNOSIS — E1159 Type 2 diabetes mellitus with other circulatory complications: Secondary | ICD-10-CM | POA: Diagnosis not present

## 2020-06-02 DIAGNOSIS — R351 Nocturia: Secondary | ICD-10-CM

## 2020-06-02 DIAGNOSIS — E119 Type 2 diabetes mellitus without complications: Secondary | ICD-10-CM | POA: Diagnosis not present

## 2020-06-02 DIAGNOSIS — E7849 Other hyperlipidemia: Secondary | ICD-10-CM

## 2020-06-02 LAB — MICROALBUMIN / CREATININE URINE RATIO
Creatinine,U: 92.7 mg/dL
Microalb Creat Ratio: 1.6 mg/g (ref 0.0–30.0)
Microalb, Ur: 1.5 mg/dL (ref 0.0–1.9)

## 2020-06-02 LAB — PSA: PSA: 3.43 ng/mL (ref 0.10–4.00)

## 2020-06-02 LAB — COMPREHENSIVE METABOLIC PANEL
ALT: 11 U/L (ref 0–53)
AST: 14 U/L (ref 0–37)
Albumin: 4.2 g/dL (ref 3.5–5.2)
Alkaline Phosphatase: 30 U/L — ABNORMAL LOW (ref 39–117)
BUN: 19 mg/dL (ref 6–23)
CO2: 29 mEq/L (ref 19–32)
Calcium: 9.3 mg/dL (ref 8.4–10.5)
Chloride: 105 mEq/L (ref 96–112)
Creatinine, Ser: 0.75 mg/dL (ref 0.40–1.50)
GFR: 91.43 mL/min (ref >60.00)
Glucose, Bld: 144 mg/dL — ABNORMAL HIGH (ref 70–99)
Potassium: 4.1 mEq/L (ref 3.5–5.1)
Sodium: 140 mEq/L (ref 135–145)
Total Bilirubin: 0.4 mg/dL (ref 0.2–1.2)
Total Protein: 6.2 g/dL (ref 6.0–8.3)

## 2020-06-02 LAB — HEMOGLOBIN A1C: Hgb A1c MFr Bld: 6.7 % — ABNORMAL HIGH (ref 4.6–6.5)

## 2020-06-02 LAB — LIPID PANEL
Cholesterol: 127 mg/dL (ref 0–200)
HDL: 63.7 mg/dL (ref >39.00)
LDL Cholesterol: 54 mg/dL (ref 0–99)
NonHDL: 63.04
Total CHOL/HDL Ratio: 2
Triglycerides: 44 mg/dL (ref 0.0–149.0)
VLDL: 8.8 mg/dL (ref 0.0–40.0)

## 2020-06-02 MED ORDER — SIMVASTATIN 40 MG PO TABS: 40.0000 mg | ORAL_TABLET | Freq: Every day | ORAL | 1 refills | Status: DC

## 2020-06-02 MED ORDER — FENOFIBRATE 160 MG PO TABS: 160.0000 mg | ORAL_TABLET | Freq: Every day | ORAL | 1 refills | Status: DC

## 2020-06-02 NOTE — Patient Instructions (Signed)

## 2020-06-02 NOTE — Assessment & Plan Note (Signed)
Well controlled, no changes to meds. Encouraged heart healthy diet such as the DASH diet and exercise as tolerated.  °

## 2020-06-02 NOTE — Progress Notes (Signed)
Subjective:    Patient ID: Zachary Michael, male    DOB: 10/17/1950, 70 y.o.   MRN: [id number]  Chief Complaint  Patient presents with  . Hypertension  . Hyperlipidemia  . Diabetes  . Follow-up    HPI Patient is in today for f/u bp chol and dm.     HPI HYPERTENSION   Blood pressure range-not checking at home   Chest pain- no      Dyspnea- no Lightheadedness- no   Edema- no  Other side effects - no   Medication compliance: good Low salt diet- yes    DIABETES    Blood Sugar ranges-not checking   Polyuria- no New Visual problems- no  Hypoglycemic symptoms- no  Other side effects-no Medication compliance - good Last eye exam- annually Foot exam- today   HYPERLIPIDEMIA  Medication compliance- good RUQ pain- no  Muscle aches- no Other side effects-no      Past Medical History:  Diagnosis Date  . Asthma   . Back pain   . CAD (coronary artery disease)    Mild  . Diabetes mellitus   . Heart valve problem   . HTN (hypertension)   . Hyperlipidemia   . Joint pain   . OSA (obstructive sleep apnea) 08/09/2017  . Palpitations   . SOB (shortness of breath)   . Subluxation    L2-3-4    Past Surgical History:  Procedure Laterality Date  . APPENDECTOMY    . CARDIAC CATHETERIZATION     We recently performed a which revealed minor coronary artery irregularities. He had normal left ventricle systolic function with an EF of 60%  . CARDIOVASCULAR STRESS TEST     EF 63%  . TONSILLECTOMY    . TUMOR REMOVAL     LEFT ARM    Family History  Problem Relation Age of Onset  . Pneumonia Mother   . Osteoporosis Mother   . Arthritis Mother   . Diabetes Mother   . Hypertension Mother   . Cancer Father        leukemia, lung  . Cancer Brother   . Diabetes Maternal Aunt   . Hypertension Maternal Aunt   . Hypertension Maternal Aunt   . Lung cancer Unknown     Social History   Socioeconomic History  . Marital status: Married    Spouse name: Scarlette CalicoFrances  . Number of  children: 3  . Years of education: Not on file  . Highest education level: Not on file  Occupational History  . 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 Smoking status: Former Smoker    Years: 7.00    Types: Pipe    Quit date: 1980    Years since quitting: 42.2  . Smokeless tobacco: Never Used  Vaping Use  . Vaping Use: Never used  Substance and Sexual Activity  . Alcohol use: Yes    Comment: occassional  . Drug use: No  . Sexual activity: Yes    Partners: Female  Other Topics Concern  . Not on file  Social History Narrative   Exercise-- walking at work   Social Determinants of Corporate investment bankerHealth   Financial Resource Strain: Not on file  Food Insecurity: Not on file  Transportation Needs: Not on file  Physical Activity: Not on file  Stress: Not on file  Social Connections: Not on file  Intimate Partner Violence: Not on file    Outpatient Medications Prior to Visit  Medication Sig  Dispense Refill  . aspirin 81 MG tablet Take 81 mg by mouth daily.    [name] b complex vitamins tablet Take 1 tablet by mouth daily.    . calcium carbonate (OS-CAL) 600 MG TABS tablet Take 600 mg by mouth daily with breakfast.    . carvedilol (COREG) 3.125 MG tablet Take 1 tablet (3.125 mg total) by mouth daily. 90 tablet 0  . cholecalciferol (VITAMIN D) 25 MCG (1000 UNIT) tablet Take 1 tablet (1,000 Units total) by mouth daily.    . Cinnamon 500 MG capsule Take 500 mg by mouth daily.    . ferrous sulfate 325 (65 FE) MG EC tablet Take 325 mg by mouth 3 (three) times daily with meals.    . metFORMIN (GLUCOPHAGE) 1000 MG tablet Take 1 tablet (1,000 mg total) by mouth 2 (two) times daily with a meal. 180 tablet 0  . Multiple Vitamin (MULTIVITAMIN) capsule Take 1 capsule by mouth daily.    . nitroGLYCERIN (NITROSTAT) 0.4 MG SL tablet Place 1 tablet (0.4 mg total) under the tongue every 5 (five) minutes as needed for chest pain. 25 tablet 5  . Omega-3 Fatty Acids (FISH OIL) 1000 MG CAPS 1  po qd 30 capsule 0  . Zinc 100 MG TABS 1 po qd  0  . fenofibrate 160 MG tablet Take 1 tablet (160 mg total) by mouth daily. 90 tablet 0  . simvastatin (ZOCOR) 40 MG tablet Take 1 tablet (40 mg total) by mouth daily at 6 PM. 90 tablet 0   No facility-administered medications prior to visit.    No Known Allergies  Review of Systems  Constitutional: Negative for fever and malaise/fatigue.  HENT: Negative for congestion.   Eyes: Negative for blurred vision.  Respiratory: Negative for shortness of breath.   Cardiovascular: Negative for chest pain, palpitations and leg swelling.  Gastrointestinal: Negative for abdominal pain, blood in stool and nausea.  Genitourinary: Negative for dysuria and frequency.  Musculoskeletal: Negative for falls.  Skin: Negative for rash.  Neurological: Negative for dizziness, loss of consciousness and headaches.  Endo/Heme/Allergies: Negative for environmental allergies.  Psychiatric/Behavioral: Negative for depression. The patient is not nervous/anxious.        Objective:    Physical Exam Vitals and nursing note reviewed.  Constitutional:      General: He is sleeping.     Appearance: He is well-developed.  HENT:     Head: Normocephalic and atraumatic.  Eyes:     Pupils: Pupils are equal, round, and reactive to light.  Neck:     Thyroid: No thyromegaly.  Cardiovascular:     Rate and Rhythm: Normal rate and regular rhythm.     Heart sounds: No murmur heard.   Pulmonary:     Effort: Pulmonary effort is normal. No respiratory distress.     Breath sounds: Normal breath sounds. No wheezing or rales.  Chest:     Chest wall: No tenderness.  Musculoskeletal:        General: No tenderness.     Cervical back: Normal range of motion and neck supple.  Skin:    General: Skin is warm and dry.  Neurological:     Mental Status: He is oriented to person, place, and time.  Psychiatric:        Behavior: Behavior normal.        Thought Content: Thought  content normal.        Judgment: Judgment normal.     BP 110/62 (BP Location: Right Arm,  Patient Position: Sitting, Cuff Size: Normal)   Pulse (!) 59   Temp 97.9 F (36.6 C) (Oral)   Resp 18   Ht 5\' 9"  (1.753 m)   Wt 218 lb 3.2 oz (99 kg)   SpO2 97%   BMI 32.22 kg/m  Wt Readings from Last 3 Encounters:  06/02/20 218 lb 3.2 oz (99 kg)  05/11/20 207 lb (93.9 kg)  03/31/20 211 lb (95.7 kg)    Diabetic Foot Exam - Simple   Simple Foot Form Diabetic Foot exam was performed with the following findings: Yes 06/02/2020  8:56 AM  Visual Inspection No deformities, no ulcerations, no other skin breakdown bilaterally: Yes Sensation Testing Intact to touch and monofilament testing bilaterally: Yes Pulse Check Posterior Tibialis and Dorsalis pulse intact bilaterally: Yes Comments    Lab Results  Component Value Date   WBC 6.5 12/03/2019   HGB 14.3 12/03/2019   HCT 42.0 12/03/2019   PLT 301 12/03/2019   GLUCOSE 131 (H) 12/03/2019   CHOL 137 12/03/2019   TRIG 54 12/03/2019   HDL 65 12/03/2019   LDLDIRECT 198.6 03/20/2009   LDLCALC 59 12/03/2019   ALT 14 12/03/2019   AST 17 12/03/2019   NA 140 12/03/2019   K 4.2 12/03/2019   CL 104 12/03/2019   CREATININE 0.78 12/03/2019   BUN 15 12/03/2019   CO2 28 12/03/2019   TSH 1.510 03/07/2019   PSA 3.68 11/07/2016   INR 1.0 06/02/2008   HGBA1C 6.5 (H) 12/03/2019   MICROALBUR 1.2 11/06/2014    Lab Results  Component Value Date   TSH 1.510 03/07/2019   Lab Results  Component Value Date   WBC 6.5 12/03/2019   HGB 14.3 12/03/2019   HCT 42.0 12/03/2019   MCV 87.7 12/03/2019   PLT 301 12/03/2019   Lab Results  Component Value Date   NA 140 12/03/2019   K 4.2 12/03/2019   CO2 28 12/03/2019   GLUCOSE 131 (H) 12/03/2019   BUN 15 12/03/2019   CREATININE 0.78 12/03/2019   BILITOT 0.6 12/03/2019   ALKPHOS 35 (L) 05/28/2019   AST 17 12/03/2019   ALT 14 12/03/2019   PROT 6.7 12/03/2019   ALBUMIN 4.5 05/28/2019   CALCIUM  10.2 12/03/2019   GFR 113.53 05/28/2019   Lab Results  Component Value Date   CHOL 137 12/03/2019   Lab Results  Component Value Date   HDL 65 12/03/2019   Lab Results  Component Value Date   LDLCALC 59 12/03/2019   Lab Results  Component Value Date   TRIG 54 12/03/2019   Lab Results  Component Value Date   CHOLHDL 2.1 12/03/2019   Lab Results  Component Value Date   HGBA1C 6.5 (H) 12/03/2019       Assessment & Plan:   Problem List Items Addressed This Visit      Unprioritized   Diabetes mellitus type II, controlled (HCC)   Relevant Medications   simvastatin (ZOCOR) 40 MG tablet   Other Relevant Orders   Hemoglobin A1c   Microalbumin / creatinine urine ratio   Hyperlipidemia    Tolerating statin, encouraged heart healthy diet, avoid trans fats, minimize simple carbs and saturated fats. Increase exercise as tolerated      Relevant Medications   simvastatin (ZOCOR) 40 MG tablet   fenofibrate 160 MG tablet   Other Relevant Orders   Lipid panel   Comprehensive metabolic panel   Hypertension - Primary    Well controlled, no changes to meds.  Encouraged heart healthy diet such as the DASH diet and exercise as tolerated.       Relevant Medications   simvastatin (ZOCOR) 40 MG tablet   fenofibrate 160 MG tablet   Other Relevant Orders   Comprehensive metabolic panel   Microalbumin / creatinine urine ratio   Type 2 diabetes mellitus without complication, without long-term current use of insulin (HCC)    Check labs today Doing well with diet and weight loss  Still taking metformin      Relevant Medications   simvastatin (ZOCOR) 40 MG tablet    Other Visit Diagnoses    Nocturia       Relevant Orders   PSA      I am having Zachary Drone maintain his aspirin, nitroGLYCERIN, multivitamin, calcium carbonate, ferrous sulfate, b complex vitamins, Cinnamon, Fish Oil, cholecalciferol, Zinc, carvedilol, metFORMIN, simvastatin, and fenofibrate.  Meds ordered  this encounter  Medications  . simvastatin (ZOCOR) 40 MG tablet    Sig: Take 1 tablet (40 mg total) by mouth daily at 6 PM.    Dispense:  90 tablet    Refill:  1  . fenofibrate 160 MG tablet    Sig: Take 1 tablet (160 mg total) by mouth daily.    Dispense:  90 tablet    Refill:  1     Donato Schultz, DO

## 2020-06-02 NOTE — Assessment & Plan Note (Signed)
Check labs today Doing well with diet and weight loss  Still taking metformin

## 2020-06-02 NOTE — Assessment & Plan Note (Signed)
Tolerating statin, encouraged heart healthy diet, avoid trans fats, minimize simple carbs and saturated fats. Increase exercise as tolerated 

## 2020-06-23 ENCOUNTER — Ambulatory Visit (INDEPENDENT_AMBULATORY_CARE_PROVIDER_SITE_OTHER): Payer: Medicare Other | Admitting: Bariatrics

## 2020-06-23 ENCOUNTER — Other Ambulatory Visit: Payer: Self-pay

## 2020-06-23 ENCOUNTER — Encounter (INDEPENDENT_AMBULATORY_CARE_PROVIDER_SITE_OTHER): Payer: Self-pay | Admitting: Bariatrics

## 2020-06-23 VITALS — BP 114/66 | HR 69 | Temp 98.3°F | Ht 69.0 in | Wt 207.0 lb

## 2020-06-23 DIAGNOSIS — Z6841 Body Mass Index (BMI) 40.0 and over, adult: Secondary | ICD-10-CM

## 2020-06-23 DIAGNOSIS — E669 Obesity, unspecified: Secondary | ICD-10-CM

## 2020-06-23 DIAGNOSIS — E1165 Type 2 diabetes mellitus with hyperglycemia: Secondary | ICD-10-CM | POA: Diagnosis not present

## 2020-06-23 DIAGNOSIS — Z6834 Body mass index (BMI) 34.0-34.9, adult: Secondary | ICD-10-CM

## 2020-06-23 DIAGNOSIS — E7849 Other hyperlipidemia: Secondary | ICD-10-CM | POA: Diagnosis not present

## 2020-06-25 NOTE — Progress Notes (Signed)
Chief Complaint:   OBESITY Zachary Michael is here to discuss his progress with his obesity treatment plan along with follow-up of his obesity related diagnoses. Zachary Michael is on keeping a food journal and adhering to recommended goals of 1500 calories and 100+ grams of protein daily and states he is following his eating plan approximately 80% of the time. Zachary Michael states he is doing yard work and walking the dog for 60 minutes 7 times per week.  Today's visit was #: 41 Starting weight: 301 lbs Starting date: 01/24/2018 Today's weight: 207 lbs Today's date: 06/23/2020 Total lbs lost to date: 94 Total lbs lost since last in-office visit: 0  Interim History: Zachary Michael has stayed the same since his last visit. He has not been as strict with his diet and he has decreased exercise.  Subjective:   1. Other hyperlipidemia Zachary Michael is on fish oil and Zocor.  2. Type 2 diabetes mellitus with hyperglycemia, without long-term current use of insulin (HCC) Zachary Michael is taking metformin, and he denies low BGs. Last A1c was 6.7.  Assessment/Plan:   1. Other hyperlipidemia Cardiovascular risk and specific lipid/LDL goals reviewed.  We discussed several lifestyle modifications today. Zachary Michael will continue his medications, and will continue to work on diet, exercise and weight loss efforts. Orders and follow up as documented in patient record.   Counseling Intensive lifestyle modifications are the first line treatment for this issue. . Dietary changes: Increase soluble fiber. Decrease simple carbohydrates. . Exercise changes: Moderate to vigorous-intensity aerobic activity 150 minutes per week if tolerated. . Lipid-lowering medications: see documented in medical record.  2. Type 2 diabetes mellitus with hyperglycemia, without long-term current use of insulin (HCC) Zachary Michael will continue his medications. Good blood sugar control is important to decrease the likelihood of diabetic complications such as  nephropathy, neuropathy, limb loss, blindness, coronary artery disease, and death. Intensive lifestyle modification including diet, exercise and weight loss are the first line of treatment for diabetes.   3. Obesity, current BMI 30 Zachary Michael is currently in the action stage of change. As such, his goal is to continue with weight loss efforts. He has agreed to keeping a food journal and adhering to recommended goals of 1550 calories and 100 grams of protein daily.   Intentional eating was discussed.  Exercise goals: As is.  Behavioral modification strategies: increasing lean protein intake, decreasing simple carbohydrates, increasing vegetables, increasing water intake, decreasing eating out, no skipping meals, meal planning and cooking strategies, keeping healthy foods in the home and planning for success.  Zachary Michael has agreed to follow-up with our clinic in 6 weeks with Zachary Michael or Zachary Cliche, PA-C. He was informed of the importance of frequent follow-up visits to maximize his success with intensive lifestyle modifications for his multiple health conditions.   Objective:   Blood pressure 114/66, pulse 69, temperature 98.3 F (36.8 C), height 5\' 9"  (1.753 m), weight 207 lb (93.9 kg), SpO2 97 %. Body mass index is 30.57 kg/m.  General: Cooperative, alert, well developed, in no acute distress. HEENT: Conjunctivae and lids unremarkable. Cardiovascular: Regular rhythm.  Lungs: Normal work of breathing. Neurologic: No focal deficits.   Lab Results  Component Value Date   CREATININE 0.75 06/02/2020   BUN 19 06/02/2020   NA 140 06/02/2020   K 4.1 06/02/2020   CL 105 06/02/2020   CO2 29 06/02/2020   Lab Results  Component Value Date   ALT 11 06/02/2020   AST 14 06/02/2020   ALKPHOS 30 (L) 06/02/2020  BILITOT 0.4 06/02/2020   Lab Results  Component Value Date   HGBA1C 6.7 (H) 06/02/2020   HGBA1C 6.5 (H) 12/03/2019   HGBA1C 6.8 (H) 05/28/2019   HGBA1C 7.1 (H) 03/07/2019    HGBA1C 6.5 (H) 10/04/2018   Lab Results  Component Value Date   INSULIN 6.3 03/07/2019   INSULIN 2.5 (L) 10/04/2018   INSULIN 4.3 05/07/2018   INSULIN 7.9 01/24/2018   Lab Results  Component Value Date   TSH 1.510 03/07/2019   Lab Results  Component Value Date   CHOL 127 06/02/2020   HDL 63.70 06/02/2020   LDLCALC 54 06/02/2020   LDLDIRECT 198.6 03/20/2009   TRIG 44.0 06/02/2020   CHOLHDL 2 06/02/2020   Lab Results  Component Value Date   WBC 6.5 12/03/2019   HGB 14.3 12/03/2019   HCT 42.0 12/03/2019   MCV 87.7 12/03/2019   PLT 301 12/03/2019   Lab Results  Component Value Date   IRON 82 03/07/2019   TIBC 375 03/07/2019   FERRITIN 161 03/07/2019    Obesity Behavioral Intervention:   Approximately 15 minutes were spent on the discussion below.  ASK: We discussed the diagnosis of obesity with Zachary Michael today and Zachary Michael agreed to give [name] permission to discuss obesity behavioral modification therapy today.  ASSESS: Zachary Michael has the diagnosis of obesity and his BMI today is 30.55. Zachary Michael is in the action stage of change.   ADVISE: Zachary Michael was educated on the multiple health risks of obesity as well as the benefit of weight loss to improve his health. He was advised of the need for long term treatment and the importance of lifestyle modifications to improve his current health and to decrease his risk of future health problems.  AGREE: Multiple dietary modification options and treatment options were discussed and Zachary Michael agreed to follow the recommendations documented in the above note.  ARRANGE: Zachary Michael was educated on the importance of frequent visits to treat obesity as outlined per CMS and USPSTF guidelines and agreed to schedule his next follow up appointment today.  Attestation Statements:   Reviewed by clinician on day of visit: allergies, medications, problem list, medical history, surgical history, family history, social history, and previous encounter  notes.   Zachary Michael, am acting as Energy manager for [employer], DO.  I have reviewed the above documentation for accuracy and completeness, and I agree with the above. Corinna Capra, DO

## 2020-07-21 ENCOUNTER — Other Ambulatory Visit: Payer: Self-pay

## 2020-07-21 ENCOUNTER — Encounter (INDEPENDENT_AMBULATORY_CARE_PROVIDER_SITE_OTHER): Payer: Self-pay | Admitting: Family Medicine

## 2020-07-21 ENCOUNTER — Ambulatory Visit (INDEPENDENT_AMBULATORY_CARE_PROVIDER_SITE_OTHER): Payer: Medicare Other | Admitting: Family Medicine

## 2020-07-21 VITALS — BP 106/61 | HR 53 | Temp 97.5°F | Ht 69.0 in | Wt 209.0 lb

## 2020-07-21 DIAGNOSIS — E7849 Other hyperlipidemia: Secondary | ICD-10-CM

## 2020-07-21 DIAGNOSIS — Z6835 Body mass index (BMI) 35.0-35.9, adult: Secondary | ICD-10-CM | POA: Diagnosis not present

## 2020-07-21 DIAGNOSIS — I152 Hypertension secondary to endocrine disorders: Secondary | ICD-10-CM | POA: Diagnosis not present

## 2020-07-21 DIAGNOSIS — E1165 Type 2 diabetes mellitus with hyperglycemia: Secondary | ICD-10-CM | POA: Diagnosis not present

## 2020-07-21 DIAGNOSIS — E66812 Obesity, class 2: Secondary | ICD-10-CM

## 2020-07-21 DIAGNOSIS — E1159 Type 2 diabetes mellitus with other circulatory complications: Secondary | ICD-10-CM

## 2020-07-21 MED ORDER — METFORMIN HCL 1000 MG PO TABS: 1000.0000 mg | ORAL_TABLET | Freq: Two times a day (BID) | ORAL | 0 refills | Status: DC

## 2020-07-21 MED ORDER — SIMVASTATIN 40 MG PO TABS: 40.0000 mg | ORAL_TABLET | Freq: Every day | ORAL | 1 refills | Status: DC

## 2020-07-21 MED ORDER — FENOFIBRATE 160 MG PO TABS
160.0000 mg | ORAL_TABLET | Freq: Every day | ORAL | 1 refills | Status: DC
Start: 2020-07-21 — End: 2020-10-13

## 2020-07-21 MED ORDER — CARVEDILOL 3.125 MG PO TABS: 3.1250 mg | ORAL_TABLET | Freq: Every day | ORAL | 0 refills | Status: DC

## 2020-07-22 NOTE — Progress Notes (Signed)
Chief Complaint:   OBESITY Zachary Michael is here to discuss his progress with his obesity treatment plan along with follow-up of his obesity related diagnoses. Zachary Michael is on keeping a food journal and adhering to recommended goals of 1550 calories and 100 grams protein and states he is following his eating plan approximately 95% of the time. Zachary Michael states he is doing yard work 45 minutes 5 times per week.  Today's visit was #: 42 Starting weight: 301 lbs Starting date: 01/24/2018 Today's weight: 209 lbs Today's date: 07/21/2020 Total lbs lost to date: 92 Total lbs lost since last in-office visit: 0  Interim History: Zachary Michael has been caring for his wife after her recent car accident. He has done the brunt of the cooking and finds himself skipping meals and thus struggling with journaling. He has focused on getting 100 grams protein and is +/- 200 calories daily.  Subjective:   1. Other hyperlipidemia Cooper is on Zocor. He denies myalgias or transaminitis.  2. Type 2 diabetes mellitus with hyperglycemia, without long-term current use of insulin (HCC) Theodor is on Glucophage with no GI side effects. His last A1c was 6.7 (6.5 previously).  3. Hypertension associated with diabetes (HCC) Vicent is on Coreg. Pt denies chest pain/chest pressure/headache. BP well controlled today.  Assessment/Plan:   1. Other hyperlipidemia Cardiovascular risk and specific lipid/LDL goals reviewed.  We discussed several lifestyle modifications today and Zachary Michael will continue to work on diet, exercise and weight loss efforts. Orders and follow up as documented in patient record.   Counseling Intensive lifestyle modifications are the first line treatment for this issue. . Dietary changes: Increase soluble fiber. Decrease simple carbohydrates. . Exercise changes: Moderate to vigorous-intensity aerobic activity 150 minutes per week if tolerated. . Lipid-lowering medications: see documented in medical  record. - fenofibrate 160 MG tablet; Take 1 tablet (160 mg total) by mouth daily.  Dispense: 90 tablet; Refill: 1 - simvastatin (ZOCOR) 40 MG tablet; Take 1 tablet (40 mg total) by mouth daily at 6 PM.  Dispense: 90 tablet; Refill: 1  2. Type 2 diabetes mellitus with hyperglycemia, without long-term current use of insulin (HCC) Good blood sugar control is important to decrease the likelihood of diabetic complications such as nephropathy, neuropathy, limb loss, blindness, coronary artery disease, and death. Intensive lifestyle modification including diet, exercise and weight loss are the first line of treatment for diabetes.  - metFORMIN (GLUCOPHAGE) 1000 MG tablet; Take 1 tablet (1,000 mg total) by mouth 2 (two) times daily with a meal.  Dispense: 180 tablet; Refill: 0  3. Hypertension associated with diabetes (HCC) Kentrel is working on healthy weight loss and exercise to improve blood pressure control. We will watch for signs of hypotension as he continues his lifestyle modifications. - carvedilol (COREG) 3.125 MG tablet; Take 1 tablet (3.125 mg total) by mouth daily.  Dispense: 90 tablet; Refill: 0  4. Class 2 severe obesity with serious comorbidity and body mass index (BMI) of 35.0 to 35.9 in adult, unspecified obesity type [facility]) Zachary Michael is currently in the action stage of change. As such, his goal is to continue with weight loss efforts. He has agreed to keeping a food journal and adhering to recommended goals of 1550 calories and 100+ grams protein.   Exercise goals: As is  Behavioral modification strategies: increasing lean protein intake, meal planning and cooking strategies, keeping healthy foods in the home and planning for success.  Zachary Michael has agreed to follow-up with our clinic in 6 weeks. He  was informed of the importance of frequent follow-up visits to maximize his success with intensive lifestyle modifications for his multiple health conditions.   Objective:   Blood pressure  106/61, pulse (!) 53, temperature (!) 97.5 F (36.4 C), height 5\' 9"  (1.753 m), weight 209 lb (94.8 kg), SpO2 100 %. Body mass index is 30.86 kg/m.  General: Cooperative, alert, well developed, in no acute distress. HEENT: Conjunctivae and lids unremarkable. Cardiovascular: Regular rhythm.  Lungs: Normal work of breathing. Neurologic: No focal deficits.   Lab Results  Component Value Date   CREATININE 0.75 06/02/2020   BUN 19 06/02/2020   NA 140 06/02/2020   K 4.1 06/02/2020   CL 105 06/02/2020   CO2 29 06/02/2020   Lab Results  Component Value Date   ALT 11 06/02/2020   AST 14 06/02/2020   ALKPHOS 30 (L) 06/02/2020   BILITOT 0.4 06/02/2020   Lab Results  Component Value Date   HGBA1C 6.7 (H) 06/02/2020   HGBA1C 6.5 (H) 12/03/2019   HGBA1C 6.8 (H) 05/28/2019   HGBA1C 7.1 (H) 03/07/2019   HGBA1C 6.5 (H) 10/04/2018   Lab Results  Component Value Date   INSULIN 6.3 03/07/2019   INSULIN 2.5 (L) 10/04/2018   INSULIN 4.3 05/07/2018   INSULIN 7.9 01/24/2018   Lab Results  Component Value Date   TSH 1.510 03/07/2019   Lab Results  Component Value Date   CHOL 127 06/02/2020   HDL 63.70 06/02/2020   LDLCALC 54 06/02/2020   LDLDIRECT 198.6 03/20/2009   TRIG 44.0 06/02/2020   CHOLHDL 2 06/02/2020   Lab Results  Component Value Date   WBC 6.5 12/03/2019   HGB 14.3 12/03/2019   HCT 42.0 12/03/2019   MCV 87.7 12/03/2019   PLT 301 12/03/2019   Lab Results  Component Value Date   IRON 82 03/07/2019   TIBC 375 03/07/2019   FERRITIN 161 03/07/2019    Obesity Behavioral Intervention:   Approximately 15 minutes were spent on the discussion below.  ASK: We discussed the diagnosis of obesity with 05/05/2019 today and Rodger agreed to give [name] permission to discuss obesity behavioral modification therapy today.  ASSESS: Zachary Michael has the diagnosis of obesity and his BMI today is 31.0. Zachary Michael is in the action stage of change.   ADVISE: Zachary Michael was educated on the  multiple health risks of obesity as well as the benefit of weight loss to improve his health. He was advised of the need for long term treatment and the importance of lifestyle modifications to improve his current health and to decrease his risk of future health problems.  AGREE: Multiple dietary modification options and treatment options were discussed and Zachary Michael agreed to follow the recommendations documented in the above note.  ARRANGE: Zachary Michael was educated on the importance of frequent visits to treat obesity as outlined per CMS and USPSTF guidelines and agreed to schedule his next follow up appointment today.  Attestation Statements:   Reviewed by clinician on day of visit: allergies, medications, problem list, medical history, surgical history, family history, social history, and previous encounter notes.  [name], CMA, am acting as transcriptionist for [name], MD.   I have reviewed the above documentation for accuracy and completeness, and I agree with the above. - Reuben Likes, MD

## 2020-09-01 ENCOUNTER — Ambulatory Visit (INDEPENDENT_AMBULATORY_CARE_PROVIDER_SITE_OTHER): Payer: Medicare Other | Admitting: Family Medicine

## 2020-09-01 ENCOUNTER — Other Ambulatory Visit: Payer: Self-pay

## 2020-09-01 ENCOUNTER — Encounter (INDEPENDENT_AMBULATORY_CARE_PROVIDER_SITE_OTHER): Payer: Self-pay | Admitting: Family Medicine

## 2020-09-01 VITALS — BP 115/61 | HR 56 | Temp 98.1°F | Ht 69.0 in | Wt 212.0 lb

## 2020-09-01 DIAGNOSIS — E559 Vitamin D deficiency, unspecified: Secondary | ICD-10-CM

## 2020-09-01 DIAGNOSIS — E1159 Type 2 diabetes mellitus with other circulatory complications: Secondary | ICD-10-CM

## 2020-09-01 DIAGNOSIS — Z6835 Body mass index (BMI) 35.0-35.9, adult: Secondary | ICD-10-CM | POA: Diagnosis not present

## 2020-09-03 NOTE — Progress Notes (Signed)
Chief Complaint:   OBESITY Zachary Michael is here to discuss his progress with his obesity treatment plan along with follow-up of his obesity related diagnoses. Zachary Michael is on keeping a food journal and adhering to recommended goals of 1550 calories and 100 g protein and states he is following his eating plan approximately 80% of the time. Zachary Michael states he is doing yard work 60 minutes 7 times per week.  Today's visit was #: 43 Starting weight: 301 lbs Starting date: 01/24/2018 Today's weight: 212 lbs Today's date: 09/01/2020 Total lbs lost to date: 89 Total lbs lost since last in-office visit: 0  Interim History: Zachary Michael has been higher in calories daily than he wants to be- ending ~2000 calories daily. He has gotten in 100 grams of protein daily, except 1 day in the last 6 weeks. He has been driving his daughter to and from work so eating out more frequently. His daughter is very demanding and emotionally labile. 2 scheduled meals daily due to driving around.  Subjective:   1. Vitamin D deficiency Pt denies nausea, vomiting, and muscle weakness but notes fatigue. Pt is on OTC Vit D 1,000 IU daily.  2. 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 He is on Metformin BID and denies GI side effects.   Assessment/Plan:   1. Vitamin D deficiency Low Vitamin D level contributes to fatigue and are associated with obesity, breast, and colon cancer. He agrees to continue to take OTC Vitamin D @1 ,000 IU daily will follow-up for routine testing of Vitamin D, at least 2-3 times per year to avoid over-replacement.  2. Controlled type 2 diabetes mellitus with other circulatory complication, without long-term current use of insulin (HCC) Good blood sugar control is important to decrease the likelihood of diabetic complications such as nephropathy, neuropathy, limb loss, blindness, coronary artery  disease, and death. Intensive lifestyle modification including diet, exercise and weight loss are the first line of treatment for diabetes. Continue current treatment plan.  3. Class 2 severe obesity with serious comorbidity and body mass index (BMI) of 35.0 to 35.9 in adult, unspecified obesity type [facility])  Zachary Michael is currently in the action stage of change. As such, his goal is to continue with weight loss efforts. He has agreed to keeping a food journal and adhering to recommended goals of 1550 calories and 100+ g protein.   Exercise goals:  As is  Behavioral modification strategies: increasing lean protein intake, meal planning and cooking strategies, and keeping healthy foods in the home.  Zachary Michael has agreed to follow-up with our clinic in 6 weeks. He was informed of the importance of frequent follow-up visits to maximize his success with intensive lifestyle modifications for his multiple health conditions.   Objective:   Blood pressure 115/61, pulse (!) 56, temperature 98.1 F (36.7 C), height 5\' 9"  (1.753 m), weight 212 lb (96.2 kg), SpO2 98 %. Body mass index is 31.31 kg/m.  General: Cooperative, alert, well developed, in no acute distress. HEENT: Conjunctivae and lids unremarkable. Cardiovascular: Regular rhythm.  Lungs: Normal work of breathing. Neurologic: No focal deficits.   Lab Results  Component Value Date   CREATININE 0.75 06/02/2020   BUN 19 06/02/2020   NA 140 06/02/2020   K 4.1 06/02/2020   CL 105 06/02/2020   CO2 29 06/02/2020   Lab Results  Component Value Date   ALT 11 06/02/2020   AST 14 06/02/2020  ALKPHOS 30 (L) 06/02/2020   BILITOT 0.4 06/02/2020   Lab Results  Component Value Date   HGBA1C 6.7 (H) 06/02/2020   HGBA1C 6.5 (H) 12/03/2019   HGBA1C 6.8 (H) 05/28/2019   HGBA1C 7.1 (H) 03/07/2019   HGBA1C 6.5 (H) 10/04/2018   Lab Results  Component Value Date   INSULIN 6.3 03/07/2019   INSULIN 2.5 (L) 10/04/2018   INSULIN 4.3 05/07/2018    INSULIN 7.9 01/24/2018   Lab Results  Component Value Date   TSH 1.510 03/07/2019   Lab Results  Component Value Date   CHOL 127 06/02/2020   HDL 63.70 06/02/2020   LDLCALC 54 06/02/2020   LDLDIRECT 198.6 03/20/2009   TRIG 44.0 06/02/2020   CHOLHDL 2 06/02/2020   Lab Results  Component Value Date   VD25OH 93.53 05/28/2019   VD25OH 58.0 03/07/2019   VD25OH 53.9 05/07/2018   Lab Results  Component Value Date   WBC 6.5 12/03/2019   HGB 14.3 12/03/2019   HCT 42.0 12/03/2019   MCV 87.7 12/03/2019   PLT 301 12/03/2019   Lab Results  Component Value Date   IRON 82 03/07/2019   TIBC 375 03/07/2019   FERRITIN 161 03/07/2019    Attestation Statements:   Reviewed by clinician on day of visit: allergies, medications, problem list, medical history, surgical history, family history, social history, and previous encounter notes.  Time spent on visit including pre-visit chart review and post-visit care and charting was 17 minutes.   [name], CMA, am acting as transcriptionist for Reuben Likes, MD.   I have reviewed the above documentation for accuracy and completeness, and I agree with the above. - Katherina Mires, MD

## 2020-10-13 ENCOUNTER — Ambulatory Visit (INDEPENDENT_AMBULATORY_CARE_PROVIDER_SITE_OTHER): Payer: Medicare Other | Admitting: Family Medicine

## 2020-10-13 ENCOUNTER — Other Ambulatory Visit: Payer: Self-pay

## 2020-10-13 ENCOUNTER — Encounter (INDEPENDENT_AMBULATORY_CARE_PROVIDER_SITE_OTHER): Payer: Self-pay | Admitting: Family Medicine

## 2020-10-13 VITALS — BP 109/66 | HR 62 | Temp 98.2°F | Ht 69.0 in | Wt 214.0 lb

## 2020-10-13 DIAGNOSIS — I10 Essential (primary) hypertension: Secondary | ICD-10-CM | POA: Diagnosis not present

## 2020-10-13 DIAGNOSIS — E1169 Type 2 diabetes mellitus with other specified complication: Secondary | ICD-10-CM

## 2020-10-13 DIAGNOSIS — Z6841 Body Mass Index (BMI) 40.0 and over, adult: Secondary | ICD-10-CM

## 2020-10-13 DIAGNOSIS — E785 Hyperlipidemia, unspecified: Secondary | ICD-10-CM | POA: Diagnosis not present

## 2020-10-13 MED ORDER — CARVEDILOL 3.125 MG PO TABS: 3.1250 mg | ORAL_TABLET | Freq: Every day | ORAL | 0 refills | Status: DC

## 2020-10-13 MED ORDER — SIMVASTATIN 40 MG PO TABS: 40.0000 mg | ORAL_TABLET | Freq: Every day | ORAL | 0 refills | Status: DC

## 2020-10-13 MED ORDER — FENOFIBRATE 160 MG PO TABS: 160.0000 mg | ORAL_TABLET | Freq: Every day | ORAL | 0 refills | Status: DC

## 2020-10-14 NOTE — Progress Notes (Signed)
Chief Complaint:   OBESITY Ilijah is here to discuss his progress with his obesity treatment plan along with follow-up of his obesity related diagnoses. Xhaiden is on keeping a food journal and adhering to recommended goals of 1550 calories and 100+ grams protein and states he is following his eating plan approximately 85% of the time. Timofey states he is doing yard work 45 minutes 7 times per week.  Today's visit was #: 44 Starting weight: 301 lbs Starting date: 01/24/2018 Today's weight: 214 lbs Today's date: 10/13/2020 Total lbs lost to date: 87 Total lbs lost since last in-office visit: 0  Interim History: Cam is having significant emotional stress with daughter's mental health struggles and water being out of the house for the last month. He is not journaling as diligently as he was previously. He is operating on the get what you can when you can in terms of eating.  Subjective:   1. Hyperlipidemia associated with type 2 diabetes mellitus (HCC) Valdis is on a statin and fenofibrate. He denies side effects of medications.  2. Essential hypertension BP controlled today. Pt is on carvedilol 3.125 mg.  Assessment/Plan:   1. Hyperlipidemia associated with type 2 diabetes mellitus (HCC) Cardiovascular risk and specific lipid/LDL goals reviewed.  We discussed several lifestyle modifications today and Salvatore will continue to work on diet, exercise and weight loss efforts. Orders and follow up as documented in patient record.   Counseling Intensive lifestyle modifications are the first line treatment for this issue. Dietary changes: Increase soluble fiber. Decrease simple carbohydrates. Exercise changes: Moderate to vigorous-intensity aerobic activity 150 minutes per week if tolerated. Lipid-lowering medications: see documented in medical record.  Refill- fenofibrate 160 MG tablet; Take 1 tablet (160 mg total) by mouth daily.  Dispense: 90 tablet; Refill: 0 Refill-  simvastatin (ZOCOR) 40 MG tablet; Take 1 tablet (40 mg total) by mouth daily at 6 PM.  Dispense: 90 tablet; Refill: 0  2. Essential hypertension Jamee is working on healthy weight loss and exercise to improve blood pressure control. We will watch for signs of hypotension as he continues his lifestyle modifications.  Refill- carvedilol (COREG) 3.125 MG tablet; Take 1 tablet (3.125 mg total) by mouth daily.  Dispense: 90 tablet; Refill: 0  3. Obesity with current BMI of 31.6  Peder is currently in the action stage of change. As such, his goal is to continue with weight loss efforts. He has agreed to keeping a food journal and adhering to recommended goals of 1550 calories and 100+ grams protein.   Exercise goals: All adults should avoid inactivity. Some physical activity is better than none, and adults who participate in any amount of physical activity gain some health benefits.  Behavioral modification strategies: increasing lean protein intake, meal planning and cooking strategies, keeping healthy foods in the home, and planning for success.  Arris has agreed to follow-up with our clinic in 6 weeks. He was informed of the importance of frequent follow-up visits to maximize his success with intensive lifestyle modifications for his multiple health conditions.   Objective:   Blood pressure 109/66, pulse 62, temperature 98.2 F (36.8 C), height 5\' 9"  (1.753 m), weight 214 lb (97.1 kg), SpO2 100 %. Body mass index is 31.6 kg/m.  General: Cooperative, alert, well developed, in no acute distress. HEENT: Conjunctivae and lids unremarkable. Cardiovascular: Regular rhythm.  Lungs: Normal work of breathing. Neurologic: No focal deficits.   Lab Results  Component Value Date   CREATININE 0.75 06/02/2020  BUN 19 06/02/2020   NA 140 06/02/2020   K 4.1 06/02/2020   CL 105 06/02/2020   CO2 29 06/02/2020   Lab Results  Component Value Date   ALT 11 06/02/2020   AST 14 06/02/2020    ALKPHOS 30 (L) 06/02/2020   BILITOT 0.4 06/02/2020   Lab Results  Component Value Date   HGBA1C 6.7 (H) 06/02/2020   HGBA1C 6.5 (H) 12/03/2019   HGBA1C 6.8 (H) 05/28/2019   HGBA1C 7.1 (H) 03/07/2019   HGBA1C 6.5 (H) 10/04/2018   Lab Results  Component Value Date   INSULIN 6.3 03/07/2019   INSULIN 2.5 (L) 10/04/2018   INSULIN 4.3 05/07/2018   INSULIN 7.9 01/24/2018   Lab Results  Component Value Date   TSH 1.510 03/07/2019   Lab Results  Component Value Date   CHOL 127 06/02/2020   HDL 63.70 06/02/2020   LDLCALC 54 06/02/2020   LDLDIRECT 198.6 03/20/2009   TRIG 44.0 06/02/2020   CHOLHDL 2 06/02/2020   Lab Results  Component Value Date   VD25OH 93.53 05/28/2019   VD25OH 58.0 03/07/2019   VD25OH 53.9 05/07/2018   Lab Results  Component Value Date   WBC 6.5 12/03/2019   HGB 14.3 12/03/2019   HCT 42.0 12/03/2019   MCV 87.7 12/03/2019   PLT 301 12/03/2019   Lab Results  Component Value Date   IRON 82 03/07/2019   TIBC 375 03/07/2019   FERRITIN 161 03/07/2019    Obesity Behavioral Intervention:   Approximately 15 minutes were spent on the discussion below.  ASK: We discussed the diagnosis of obesity with [name] today and Minard agreed to give [name] permission to discuss obesity behavioral modification therapy today.  ASSESS: Devontaye has the diagnosis of obesity and his BMI today is 31.6. Warren is in the action stage of change.   ADVISE: Melissa was educated on the multiple health risks of obesity as well as the benefit of weight loss to improve his health. He was advised of the need for long term treatment and the importance of lifestyle modifications to improve his current health and to decrease his risk of future health problems.  AGREE: Multiple dietary modification options and treatment options were discussed and Breckyn agreed to follow the recommendations documented in the above note.  ARRANGE: Tylek was educated on the importance of frequent  visits to treat obesity as outlined per CMS and USPSTF guidelines and agreed to schedule his next follow up appointment today.  Attestation Statements:   Reviewed by clinician on day of visit: allergies, medications, problem list, medical history, surgical history, family history, social history, and previous encounter notes.  [name], CMA, am acting as transcriptionist for Reuben Likes, MD.   I have reviewed the above documentation for accuracy and completeness, and I agree with the above. - Reuben Likes, MD

## 2020-10-18 ENCOUNTER — Other Ambulatory Visit (INDEPENDENT_AMBULATORY_CARE_PROVIDER_SITE_OTHER): Payer: Self-pay | Admitting: Family Medicine

## 2020-10-18 DIAGNOSIS — E1165 Type 2 diabetes mellitus with hyperglycemia: Secondary | ICD-10-CM

## 2020-10-19 ENCOUNTER — Encounter (INDEPENDENT_AMBULATORY_CARE_PROVIDER_SITE_OTHER): Payer: Self-pay

## 2020-10-19 NOTE — Telephone Encounter (Signed)
Message sent to pt-CAS 

## 2020-11-24 ENCOUNTER — Encounter (INDEPENDENT_AMBULATORY_CARE_PROVIDER_SITE_OTHER): Payer: Self-pay | Admitting: Family Medicine

## 2020-11-24 ENCOUNTER — Other Ambulatory Visit: Payer: Self-pay

## 2020-11-24 ENCOUNTER — Ambulatory Visit (INDEPENDENT_AMBULATORY_CARE_PROVIDER_SITE_OTHER): Payer: Medicare Other | Admitting: Family Medicine

## 2020-11-24 VITALS — BP 128/70 | HR 60 | Temp 98.1°F | Ht 69.0 in | Wt 213.0 lb

## 2020-11-24 DIAGNOSIS — E1169 Type 2 diabetes mellitus with other specified complication: Secondary | ICD-10-CM

## 2020-11-24 DIAGNOSIS — Z6841 Body Mass Index (BMI) 40.0 and over, adult: Secondary | ICD-10-CM | POA: Diagnosis not present

## 2020-11-24 DIAGNOSIS — E785 Hyperlipidemia, unspecified: Secondary | ICD-10-CM | POA: Diagnosis not present

## 2020-11-24 DIAGNOSIS — E1165 Type 2 diabetes mellitus with hyperglycemia: Secondary | ICD-10-CM

## 2020-11-24 MED ORDER — METFORMIN HCL 1000 MG PO TABS: 1000.0000 mg | ORAL_TABLET | Freq: Two times a day (BID) | ORAL | 0 refills | Status: DC

## 2020-11-24 NOTE — Progress Notes (Signed)
Chief Complaint:   OBESITY Zachary Michael is here to discuss his progress with his obesity treatment plan along with follow-up of his obesity related diagnoses. Zachary Michael is on keeping a food journal and adhering to recommended goals of 1550 calories and 100+ grams protein and states he is following his eating plan approximately 95% of the time. Zachary Michael states he is walking 1/2 mile and doing yard work 60 minutes 3-4 times per week.  Today's visit was #: 45 Starting weight: 301 lbs Starting date: 01/24/2018 Today's weight: 213 lbs Today's date: 11/24/2020 Total lbs lost to date: 88 Total lbs lost since last in-office visit: 1  Interim History: Zachary Michael has been trying to do more activity with coolness of weather. 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 He is journaling 90-95% of the time, except for anniversary. He is hitting 80 grams of protein daily and reports some hunger.  Subjective:   1. Type 2 diabetes mellitus with hyperglycemia, without long-term current use of insulin (HCC) Zachary Michael is on Metformin 1000 mg BID. His last A1c was 6.7 and insulin level 2.3 (that was a year ago).  2. Hyperlipidemia associated with type 2 diabetes mellitus (HCC) He is on Zocor 40 mg and denies myalgias or transaminitis.  Assessment/Plan:   1. Type 2 diabetes mellitus with hyperglycemia, without long-term current use of insulin (HCC) Good blood sugar control is important to decrease the likelihood of diabetic complications such as nephropathy, neuropathy, limb loss, blindness, coronary artery disease, and death. Intensive lifestyle modification including diet, exercise and weight loss are the first line of treatment for diabetes. Continue current treatment plan. Labs per Dr. Zola Button.  Refill- metFORMIN (GLUCOPHAGE) 1000 MG tablet; Take 1 tablet (1,000 mg total) by mouth 2 (two) times daily with a meal.  Dispense: 180 tablet;  Refill: 0  2. Hyperlipidemia associated with type 2 diabetes mellitus (HCC) Cardiovascular risk and specific lipid/LDL goals reviewed.  We discussed several lifestyle modifications today and Zachary Michael will continue to work on diet, exercise and weight loss efforts. Orders and follow up as documented in patient record. Continue Zocor as current dose.  Counseling Intensive lifestyle modifications are the first line treatment for this issue. Dietary changes: Increase soluble fiber. Decrease simple carbohydrates. Exercise changes: Moderate to vigorous-intensity aerobic activity 150 minutes per week if tolerated. Lipid-lowering medications: see documented in medical record.  3. Obesity with current BMI of 31.6  Zachary Michael is currently in the action stage of change. As such, his goal is to continue with weight loss efforts. He has agreed to keeping a food journal and adhering to recommended goals of 1550 calories and 100+ grams protein.   Exercise goals:  As is  Behavioral modification strategies: increasing lean protein intake, meal planning and cooking strategies, emotional eating strategies, and keeping a strict food journal.  Zachary Michael has agreed to follow-up with our clinic in 6 weeks. He was informed of the importance of frequent follow-up visits to maximize his success with intensive lifestyle modifications for his multiple health conditions.   Objective:   Blood pressure 128/70, pulse 60, temperature 98.1 F (36.7 C), height 5\' 9"  (1.753 m), weight 213 lb (96.6 kg), SpO2 96 %. Body mass index is 31.45 kg/m.  General: Cooperative, alert, well developed, in no acute distress. HEENT: Conjunctivae and lids unremarkable. Cardiovascular: Regular rhythm.  Lungs: Normal work of breathing. Neurologic: No focal deficits.   Lab Results  Component Value  Date   CREATININE 0.75 06/02/2020   BUN 19 06/02/2020   NA 140 06/02/2020   K 4.1 06/02/2020   CL 105 06/02/2020   CO2 29 06/02/2020   Lab  Results  Component Value Date   ALT 11 06/02/2020   AST 14 06/02/2020   ALKPHOS 30 (L) 06/02/2020   BILITOT 0.4 06/02/2020   Lab Results  Component Value Date   HGBA1C 6.7 (H) 06/02/2020   HGBA1C 6.5 (H) 12/03/2019   HGBA1C 6.8 (H) 05/28/2019   HGBA1C 7.1 (H) 03/07/2019   HGBA1C 6.5 (H) 10/04/2018   Lab Results  Component Value Date   INSULIN 6.3 03/07/2019   INSULIN 2.5 (L) 10/04/2018   INSULIN 4.3 05/07/2018   INSULIN 7.9 01/24/2018   Lab Results  Component Value Date   TSH 1.510 03/07/2019   Lab Results  Component Value Date   CHOL 127 06/02/2020   HDL 63.70 06/02/2020   LDLCALC 54 06/02/2020   LDLDIRECT 198.6 03/20/2009   TRIG 44.0 06/02/2020   CHOLHDL 2 06/02/2020   Lab Results  Component Value Date   VD25OH 93.53 05/28/2019   VD25OH 58.0 03/07/2019   VD25OH 53.9 05/07/2018   Lab Results  Component Value Date   WBC 6.5 12/03/2019   HGB 14.3 12/03/2019   HCT 42.0 12/03/2019   MCV 87.7 12/03/2019   PLT 301 12/03/2019   Lab Results  Component Value Date   IRON 82 03/07/2019   TIBC 375 03/07/2019   FERRITIN 161 03/07/2019    Obesity Behavioral Intervention:   Approximately 15 minutes were spent on the discussion below.  ASK: We discussed the diagnosis of obesity with Zachary Michael today and Zachary Michael agreed to give [name] permission to discuss obesity behavioral modification therapy today.  ASSESS: Zachary Michael has the diagnosis of obesity and his BMI today is 31.6. Zachary Michael is in the action stage of change.   ADVISE: Zachary Michael was educated on the multiple health risks of obesity as well as the benefit of weight loss to improve his health. He was advised of the need for long term treatment and the importance of lifestyle modifications to improve his current health and to decrease his risk of future health problems.  AGREE: Multiple dietary modification options and treatment options were discussed and Zachary Michael agreed to follow the recommendations documented in the  above note.  ARRANGE: Zachary Michael was educated on the importance of frequent visits to treat obesity as outlined per CMS and USPSTF guidelines and agreed to schedule his next follow up appointment today.  Attestation Statements:   Reviewed by clinician on day of visit: allergies, medications, problem list, medical history, surgical history, family history, social history, and previous encounter notes.  [name], CMA, am acting as transcriptionist for Reuben Likes, MD.   I have reviewed the above documentation for accuracy and completeness, and I agree with the above. - Reuben Likes, MD

## 2020-12-03 ENCOUNTER — Ambulatory Visit (INDEPENDENT_AMBULATORY_CARE_PROVIDER_SITE_OTHER): Payer: Medicare Other | Admitting: Family Medicine

## 2020-12-03 ENCOUNTER — Other Ambulatory Visit: Payer: Self-pay | Admitting: Family Medicine

## 2020-12-03 ENCOUNTER — Encounter: Payer: Self-pay | Admitting: Family Medicine

## 2020-12-03 ENCOUNTER — Other Ambulatory Visit: Payer: Self-pay

## 2020-12-03 VITALS — BP 118/72 | HR 56 | Temp 98.1°F | Resp 18 | Ht 69.0 in | Wt 219.0 lb

## 2020-12-03 DIAGNOSIS — H00015 Hordeolum externum left lower eyelid: Secondary | ICD-10-CM

## 2020-12-03 DIAGNOSIS — E1169 Type 2 diabetes mellitus with other specified complication: Secondary | ICD-10-CM

## 2020-12-03 DIAGNOSIS — Z1211 Encounter for screening for malignant neoplasm of colon: Secondary | ICD-10-CM | POA: Diagnosis not present

## 2020-12-03 DIAGNOSIS — E1159 Type 2 diabetes mellitus with other circulatory complications: Secondary | ICD-10-CM | POA: Diagnosis not present

## 2020-12-03 DIAGNOSIS — E1165 Type 2 diabetes mellitus with hyperglycemia: Secondary | ICD-10-CM | POA: Diagnosis not present

## 2020-12-03 DIAGNOSIS — I10 Essential (primary) hypertension: Secondary | ICD-10-CM

## 2020-12-03 DIAGNOSIS — E119 Type 2 diabetes mellitus without complications: Secondary | ICD-10-CM

## 2020-12-03 DIAGNOSIS — E559 Vitamin D deficiency, unspecified: Secondary | ICD-10-CM | POA: Diagnosis not present

## 2020-12-03 DIAGNOSIS — E785 Hyperlipidemia, unspecified: Secondary | ICD-10-CM

## 2020-12-03 LAB — LIPID PANEL
Cholesterol: 147 mg/dL (ref 0–200)
HDL: 64 mg/dL (ref >39.00)
LDL Cholesterol: 71 mg/dL (ref 0–99)
NonHDL: 83
Total CHOL/HDL Ratio: 2
Triglycerides: 62 mg/dL (ref 0.0–149.0)
VLDL: 12.4 mg/dL (ref 0.0–40.0)

## 2020-12-03 LAB — COMPREHENSIVE METABOLIC PANEL
ALT: 12 U/L (ref 0–53)
AST: 16 U/L (ref 0–37)
Albumin: 4.4 g/dL (ref 3.5–5.2)
Alkaline Phosphatase: 33 U/L — ABNORMAL LOW (ref 39–117)
BUN: 20 mg/dL (ref 6–23)
CO2: 30 mEq/L (ref 19–32)
Calcium: 9.8 mg/dL (ref 8.4–10.5)
Chloride: 103 mEq/L (ref 96–112)
Creatinine, Ser: 0.84 mg/dL (ref 0.40–1.50)
GFR: 88.04 mL/min (ref >60.00)
Glucose, Bld: 167 mg/dL — ABNORMAL HIGH (ref 70–99)
Potassium: 4.3 mEq/L (ref 3.5–5.1)
Sodium: 138 mEq/L (ref 135–145)
Total Bilirubin: 0.6 mg/dL (ref 0.2–1.2)
Total Protein: 6.7 g/dL (ref 6.0–8.3)

## 2020-12-03 LAB — HEMOGLOBIN A1C: Hgb A1c MFr Bld: 7.6 % — ABNORMAL HIGH (ref 4.6–6.5)

## 2020-12-03 LAB — MICROALBUMIN / CREATININE URINE RATIO
Creatinine,U: 106.8 mg/dL
Microalb Creat Ratio: 1.7 mg/g (ref 0.0–30.0)
Microalb, Ur: 1.8 mg/dL (ref 0.0–1.9)

## 2020-12-03 LAB — VITAMIN D 25 HYDROXY (VIT D DEFICIENCY, FRACTURES): VITD: 81.04 ng/mL (ref 30.00–100.00)

## 2020-12-03 MED ORDER — FENOFIBRATE 160 MG PO TABS: 160.0000 mg | ORAL_TABLET | Freq: Every day | ORAL | 1 refills | Status: DC

## 2020-12-03 MED ORDER — NITROGLYCERIN 0.4 MG SL SUBL: 0.4000 mg | SUBLINGUAL_TABLET | SUBLINGUAL | 5 refills | Status: AC | PRN

## 2020-12-03 MED ORDER — SIMVASTATIN 40 MG PO TABS: 40.0000 mg | ORAL_TABLET | Freq: Every day | ORAL | 1 refills | Status: DC

## 2020-12-03 NOTE — Assessment & Plan Note (Signed)
Well controlled, no changes to meds. Encouraged heart healthy diet such as the DASH diet and exercise as tolerated.  °

## 2020-12-03 NOTE — Assessment & Plan Note (Signed)
Encourage heart healthy diet such as MIND or DASH diet, increase exercise, avoid trans fats, simple carbohydrates and processed foods, consider a krill or fish or flaxseed oil cap daily.  °

## 2020-12-03 NOTE — Assessment & Plan Note (Signed)
hgba1c to be checked, minimize simple carbs. Increase exercise as tolerated. Continue current meds  

## 2020-12-03 NOTE — Progress Notes (Signed)
Subjective:   By signing my name below, I, Zachary Michael, attest that this documentation has been prepared under the direction and in the presence of Dr. Seabron Spates, DO. 12/03/2020    Patient ID: Zachary Michael, male    DOB: [birth year], 70 y.o.   MRN: [id number]  Chief Complaint  Patient presents with   Hypertension   Hyperlipidemia   Diabetes   Follow-up    Hypertension Pertinent negatives include no blurred vision, chest pain, headaches, malaise/fatigue, palpitations or shortness of breath.  Hyperlipidemia Pertinent negatives include no chest pain or shortness of breath.  Diabetes Pertinent negatives for hypoglycemia include no dizziness, headaches or nervousness/anxiousness. Pertinent negatives for diabetes include no blurred vision and no chest pain.  Patient is in today for a office visit. He is requesting a refill on 160 mg fenofibrate daily PO, 0.4 mg nitroglycerin PRN, 40 mg Zocor daily PO.  He continues seeing a healthy weight and wellness program. He reports losing around 100 lb's since he started. He reports doing well while on the program. His goal is to lose 30 more lb's so that he is 190 lb's.   Wt Readings from Last 3 Encounters:  12/03/20 219 lb (99.3 kg)  11/24/20 213 lb (96.6 kg)  10/13/20 214 lb (97.1 kg)   He does not consistently check his blood sugar at home. He continues taking 1000 mg metformin daily PO and reports no new issues while taking it.   Lab Results  Component Value Date   HGBA1C 6.7 (H) 06/02/2020   His blood pressure is doing well during this visit. He continues taking 3.125 mg carvedilol daily PO and reports no new issues while taking it.   BP Readings from Last 3 Encounters:  12/03/20 118/72  11/24/20 128/70  10/13/20 109/66   Pulse Readings from Last 3 Encounters:  12/03/20 (!) 56  11/24/20 60  10/13/20 62   He is taking vitamins D supplements daily PO and reports no issues while taking it.  He is due for shingles and  tetanus vaccines and is interested in getting them at his pharmacy. He is not interested in getting the Covid-19 vaccines.  He is due for vision care and is planning to make an appointment to get it completed.  He is due for colonoscopy. He is interested in setting up an appointment to get it completed.    Past Medical History:  Diagnosis Date   Asthma    Back pain    CAD (coronary artery disease)    Mild   Diabetes mellitus    Heart valve problem    HTN (hypertension)    Hyperlipidemia    Joint pain    OSA (obstructive sleep apnea) 08/09/2017   Palpitations    SOB (shortness of breath)    Subluxation    L2-3-4    Past Surgical History:  Procedure Laterality Date   APPENDECTOMY     CARDIAC CATHETERIZATION     We recently performed a which revealed minor coronary artery irregularities. He had normal left ventricle systolic function with an EF of 60%   CARDIOVASCULAR STRESS TEST     EF 63%   TONSILLECTOMY     TUMOR REMOVAL     LEFT ARM    Family History  Problem Relation Age of Onset   Pneumonia Mother    Osteoporosis Mother    Arthritis Mother    Diabetes Mother    Hypertension Mother    Cancer Father  leukemia, lung   Cancer Brother    Diabetes Maternal Aunt    Hypertension Maternal Aunt    Hypertension Maternal Aunt    Lung cancer Unknown     Social History   Socioeconomic History   Marital status: Married    Spouse name: Scarlette Calico   Number of children: 3   Years of education: Not on file   Highest education level: Not on file  Occupational History   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 [employer]    Comment: RETIRED  Tobacco Use   Smoking status: Former    Types: Pipe    Quit date: 1980    Years since quitting: 42.7   Smokeless tobacco: Never  Vaping Use   Vaping Use: Never used  Substance and Sexual Activity   Alcohol use: Yes    Comment: occassional   Drug use: No   Sexual activity: Yes    Partners: Female  Other Topics Concern   Not on file  Social  History Narrative   Exercise-- walking at work   Social Determinants of Corporate investment banker Strain: Not on file  Food Insecurity: Not on file  Transportation Needs: Not on file  Physical Activity: Not on file  Stress: Not on file  Social Connections: Not on file  Intimate Partner Violence: Not on file    Outpatient Medications Prior to Visit  Medication Sig Dispense Refill   aspirin 81 MG tablet Take 81 mg by mouth daily.     b complex vitamins tablet Take 1 tablet by mouth daily.     calcium carbonate (OS-CAL) 600 MG TABS tablet Take 600 mg by mouth daily with breakfast.     carvedilol (COREG) 3.125 MG tablet Take 1 tablet (3.125 mg total) by mouth daily. 90 tablet 0   cholecalciferol (VITAMIN D) 25 MCG (1000 UNIT) tablet Take 1 tablet (1,000 Units total) by mouth daily.     Cinnamon 500 MG capsule Take 500 mg by mouth daily.     ferrous sulfate 325 (65 FE) MG EC tablet Take 325 mg by mouth 3 (three) times daily with meals.     metFORMIN (GLUCOPHAGE) 1000 MG tablet Take 1 tablet (1,000 mg total) by mouth 2 (two) times daily with a meal. 180 tablet 0   Multiple Vitamin (MULTIVITAMIN) capsule Take 1 capsule by mouth daily.     Omega-3 Fatty Acids (FISH OIL) 1000 MG CAPS 1 po qd 30 capsule 0   Zinc 100 MG TABS 1 po qd  0   fenofibrate 160 MG tablet Take 1 tablet (160 mg total) by mouth daily. 90 tablet 0   nitroGLYCERIN (NITROSTAT) 0.4 MG SL tablet Place 1 tablet (0.4 mg total) under the tongue every 5 (five) minutes as needed for chest pain. 25 tablet 5   simvastatin (ZOCOR) 40 MG tablet Take 1 tablet (40 mg total) by mouth daily at 6 PM. 90 tablet 0   No facility-administered medications prior to visit.    No Known Allergies  Review of Systems  Constitutional:  Negative for fever and malaise/fatigue.  HENT:  Negative for congestion.   Eyes:  Negative for blurred vision.  Respiratory:  Negative for shortness of breath.   Cardiovascular:  Negative for chest pain,  palpitations and leg swelling.  Gastrointestinal:  Negative for abdominal pain, blood in stool and nausea.  Genitourinary:  Negative for dysuria and frequency.  Musculoskeletal:  Negative for falls.  Skin:  Negative for rash.  Neurological:  Negative for dizziness, loss of  consciousness and headaches.  Endo/Heme/Allergies:  Negative for environmental allergies.  Psychiatric/Behavioral:  Negative for depression. The patient is not nervous/anxious.       Objective:    Physical Exam Vitals and nursing note reviewed.  Constitutional:      General: He is not in acute distress.    Appearance: Normal appearance. He is not ill-appearing.  HENT:     Head: Normocephalic and atraumatic.     Right Ear: External ear normal.     Left Ear: External ear normal.  Eyes:     Extraocular Movements: Extraocular movements intact.     Pupils: Pupils are equal, round, and reactive to light.  Cardiovascular:     Rate and Rhythm: Normal rate and regular rhythm.     Heart sounds: Normal heart sounds. No murmur heard.   No gallop.  Pulmonary:     Effort: Pulmonary effort is normal. No respiratory distress.     Breath sounds: Normal breath sounds. No wheezing or rales.  Feet:     Comments: Diabetic Foot Exam - Simple   Simple Foot Form Diabetic Foot exam was performed with the following findings: Yes  12/03/2020  9:14 AM  Visual Inspection No deformities, no ulcerations, no other skin breakdown bilaterally: Yes Sensation Testing Intact to touch and monofilament testing bilaterally: Yes Pulse Check Posterior Tibialis and Dorsalis pulse intact bilaterally: Yes Comments    Skin:    General: Skin is warm and dry.  Neurological:     Mental Status: He is alert and oriented to person, place, and time.  Psychiatric:        Behavior: Behavior normal.    BP 118/72 (BP Location: Left Arm, Patient Position: Sitting, Cuff Size: Normal)   Pulse (!) 56   Temp 98.1 F (36.7 C) (Oral)   Resp 18   Ht 5\' 9"   (1.753 m)   Wt 219 lb (99.3 kg)   SpO2 97%   BMI 32.34 kg/m  Wt Readings from Last 3 Encounters:  12/03/20 219 lb (99.3 kg)  11/24/20 213 lb (96.6 kg)  10/13/20 214 lb (97.1 kg)    Diabetic Foot Exam - Simple   Simple Foot Form Diabetic Foot exam was performed with the following findings: Yes 12/03/2020  9:14 AM  Visual Inspection No deformities, no ulcerations, no other skin breakdown bilaterally: Yes Sensation Testing Intact to touch and monofilament testing bilaterally: Yes Pulse Check Posterior Tibialis and Dorsalis pulse intact bilaterally: Yes Comments    Lab Results  Component Value Date   WBC 6.5 12/03/2019   HGB 14.3 12/03/2019   HCT 42.0 12/03/2019   PLT 301 12/03/2019   GLUCOSE 144 (H) 06/02/2020   CHOL 127 06/02/2020   TRIG 44.0 06/02/2020   HDL 63.70 06/02/2020   LDLDIRECT 198.6 03/20/2009   LDLCALC 54 06/02/2020   ALT 11 06/02/2020   AST 14 06/02/2020   NA 140 06/02/2020   K 4.1 06/02/2020   CL 105 06/02/2020   CREATININE 0.75 06/02/2020   BUN 19 06/02/2020   CO2 29 06/02/2020   TSH 1.510 03/07/2019   PSA 3.43 06/02/2020   INR 1.0 06/02/2008   HGBA1C 6.7 (H) 06/02/2020   MICROALBUR 1.5 06/02/2020    Lab Results  Component Value Date   TSH 1.510 03/07/2019   Lab Results  Component Value Date   WBC 6.5 12/03/2019   HGB 14.3 12/03/2019   HCT 42.0 12/03/2019   MCV 87.7 12/03/2019   PLT 301 12/03/2019   Lab Results  Component Value Date   NA 140 06/02/2020   K 4.1 06/02/2020   CO2 29 06/02/2020   GLUCOSE 144 (H) 06/02/2020   BUN 19 06/02/2020   CREATININE 0.75 06/02/2020   BILITOT 0.4 06/02/2020   ALKPHOS 30 (L) 06/02/2020   AST 14 06/02/2020   ALT 11 06/02/2020   PROT 6.2 06/02/2020   ALBUMIN 4.2 06/02/2020   CALCIUM 9.3 06/02/2020   GFR 91.43 06/02/2020   Lab Results  Component Value Date   CHOL 127 06/02/2020   Lab Results  Component Value Date   HDL 63.70 06/02/2020   Lab Results  Component Value Date   LDLCALC 54  06/02/2020   Lab Results  Component Value Date   TRIG 44.0 06/02/2020   Lab Results  Component Value Date   CHOLHDL 2 06/02/2020   Lab Results  Component Value Date   HGBA1C 6.7 (H) 06/02/2020       Assessment & Plan:   Problem List Items Addressed This Visit       Unprioritized   Diabetes mellitus type II, controlled (HCC)    hgba1c to be checked  minimize simple carbs. Increase exercise as tolerated. Continue current meds       Relevant Medications   simvastatin (ZOCOR) 40 MG tablet   Hyperlipidemia    Encourage heart healthy diet such as MIND or DASH diet, increase exercise, avoid trans fats, simple carbohydrates and processed foods, consider a krill or fish or flaxseed oil cap daily.       Relevant Medications   fenofibrate 160 MG tablet   nitroGLYCERIN (NITROSTAT) 0.4 MG SL tablet   simvastatin (ZOCOR) 40 MG tablet   Hypertension    Well controlled, no changes to meds. Encouraged heart healthy diet such as the DASH diet and exercise as tolerated.       Relevant Medications   fenofibrate 160 MG tablet   nitroGLYCERIN (NITROSTAT) 0.4 MG SL tablet   simvastatin (ZOCOR) 40 MG tablet   Other Relevant Orders   Hemoglobin A1c   Comprehensive metabolic panel   Lipid panel   Microalbumin / creatinine urine ratio   Insulin, random   Vitamin D (25 hydroxy)   Type 2 diabetes mellitus without complication, without long-term current use of insulin (HCC)    hgba1c to be checked, minimize simple carbs. Increase exercise as tolerated. Continue current meds       Relevant Medications   simvastatin (ZOCOR) 40 MG tablet   Other Visit Diagnoses     Type 2 diabetes mellitus with hyperglycemia, without long-term current use of insulin (HCC)    -  Primary   Relevant Medications   simvastatin (ZOCOR) 40 MG tablet   Other Relevant Orders   Hemoglobin A1c   Comprehensive metabolic panel   Lipid panel   Microalbumin / creatinine urine ratio   Insulin, random   Vitamin  D (25 hydroxy)   Hyperlipidemia associated with type 2 diabetes mellitus (HCC)       Relevant Medications   fenofibrate 160 MG tablet   nitroGLYCERIN (NITROSTAT) 0.4 MG SL tablet   simvastatin (ZOCOR) 40 MG tablet   Other Relevant Orders   Hemoglobin A1c   Comprehensive metabolic panel   Lipid panel   Microalbumin / creatinine urine ratio   Insulin, random   Vitamin D (25 hydroxy)   Morbid obesity (HCC)       Relevant Orders   Hemoglobin A1c   Comprehensive metabolic panel   Lipid panel   Microalbumin /  creatinine urine ratio   Insulin, random   Vitamin D (25 hydroxy)   Vitamin D deficiency       Relevant Orders   Vitamin D (25 hydroxy)   Colon cancer screening       Relevant Orders   Ambulatory referral to Gastroenterology   Hordeolum externum of left lower eyelid            Meds ordered this encounter  Medications   fenofibrate 160 MG tablet    Sig: Take 1 tablet (160 mg total) by mouth daily.    Dispense:  90 tablet    Refill:  1   nitroGLYCERIN (NITROSTAT) 0.4 MG SL tablet    Sig: Place 1 tablet (0.4 mg total) under the tongue every 5 (five) minutes as needed for chest pain.    Dispense:  25 tablet    Refill:  5   simvastatin (ZOCOR) 40 MG tablet    Sig: Take 1 tablet (40 mg total) by mouth daily at 6 PM.    Dispense:  90 tablet    Refill:  1    I, Dr. Seabron Spates, DO, personally preformed the services described in this documentation.  All medical record entries made by the scribe were at my direction and in my presence.  I have reviewed the chart and discharge instructions (if applicable) and agree that the record reflects my personal performance and is accurate and complete. 12/03/2020   I,Zachary Michael,acting as a scribe for Donato Schultz, DO.,have documented all relevant documentation on the behalf of Donato Schultz, DO,as directed by  Donato Schultz, DO while in the presence of Donato Schultz, DO.   Donato Schultz,  DO

## 2020-12-03 NOTE — Patient Instructions (Signed)

## 2020-12-03 NOTE — Assessment & Plan Note (Signed)
hgba1c to be checked minimize simple carbs. Increase exercise as tolerated. Continue current meds 

## 2020-12-04 LAB — INSULIN, RANDOM: Insulin: 2.1 u[IU]/mL

## 2020-12-09 ENCOUNTER — Other Ambulatory Visit: Payer: Self-pay

## 2020-12-09 MED ORDER — RYBELSUS 3 MG PO TABS: 1.0000 | ORAL_TABLET | Freq: Every day | ORAL | 0 refills | Status: DC

## 2021-01-05 ENCOUNTER — Encounter (INDEPENDENT_AMBULATORY_CARE_PROVIDER_SITE_OTHER): Payer: Self-pay | Admitting: Family Medicine

## 2021-01-05 ENCOUNTER — Other Ambulatory Visit: Payer: Self-pay

## 2021-01-05 ENCOUNTER — Ambulatory Visit (INDEPENDENT_AMBULATORY_CARE_PROVIDER_SITE_OTHER): Payer: Medicare Other | Admitting: Family Medicine

## 2021-01-05 VITALS — BP 102/62 | HR 64 | Temp 97.8°F | Ht 69.0 in | Wt 212.0 lb

## 2021-01-05 DIAGNOSIS — E1169 Type 2 diabetes mellitus with other specified complication: Secondary | ICD-10-CM

## 2021-01-05 DIAGNOSIS — E785 Hyperlipidemia, unspecified: Secondary | ICD-10-CM | POA: Diagnosis not present

## 2021-01-05 DIAGNOSIS — I10 Essential (primary) hypertension: Secondary | ICD-10-CM

## 2021-01-05 DIAGNOSIS — Z6841 Body Mass Index (BMI) 40.0 and over, adult: Secondary | ICD-10-CM

## 2021-01-05 MED ORDER — SIMVASTATIN 40 MG PO TABS: 40.0000 mg | ORAL_TABLET | Freq: Every day | ORAL | 1 refills | Status: DC

## 2021-01-05 MED ORDER — FENOFIBRATE 160 MG PO TABS: 160.0000 mg | ORAL_TABLET | Freq: Every day | ORAL | 1 refills | Status: DC

## 2021-01-05 MED ORDER — CARVEDILOL 3.125 MG PO TABS: 3.1250 mg | ORAL_TABLET | Freq: Every day | ORAL | 0 refills | Status: DC

## 2021-01-05 NOTE — Progress Notes (Signed)
Chief Complaint:   OBESITY Zachary Michael is here to discuss his progress with his obesity treatment plan along with follow-up of his obesity related diagnoses. Zachary Michael is on keeping a food journal and adhering to recommended goals of 1550 calories and 100+ grams protein and states he is following his eating plan approximately 90% of the time. Zachary Michael states he is walking the dog and raking leaves 60 minutes 3-4 times per week.  Today's visit was #: 46 Starting weight: 301 lbs Starting date: 01/24/2018 Today's weight: 212 lbs Today's date: 01/05/2021 Total lbs lost to date: 89 Total lbs lost since last in-office visit: 1  Interim History: Zachary Michael voices a difficult last few weeks with his daughter and arguments. He is experiencing stress with volatile relationship between his wife and daughter. He has done some journaling. Pt is trying to employ deep meditation. His schedule is very erratic and so his eating is very uncertain.  Subjective:   1. Hyperlipidemia associated with type 2 diabetes mellitus (HCC) Zachary Michael is on statin and fenofibrate. His HDL is 64, LDL 71, and triglycerides 62. Pt denies myalgias or transaminitis.  2. Essential hypertension BP well controlled. Zachary Michael is on Coreg. Pt denies chest pain/chest pressure/headache.  Assessment/Plan:   1. Hyperlipidemia associated with type 2 diabetes mellitus (HCC) Cardiovascular risk and specific lipid/LDL goals reviewed.  We discussed several lifestyle modifications today and Zachary Michael will continue to work on diet, exercise and weight loss efforts. Orders and follow up as documented in patient record.   Counseling Intensive lifestyle modifications are the first line treatment for this issue. Dietary changes: Increase soluble fiber. Decrease simple carbohydrates. Exercise changes: Moderate to vigorous-intensity aerobic activity 150 minutes per week if tolerated. Lipid-lowering medications: see documented in medical  record.  Refill- fenofibrate 160 MG tablet; Take 1 tablet (160 mg total) by mouth daily.  Dispense: 90 tablet; Refill: 1 Refill- simvastatin (ZOCOR) 40 MG tablet; Take 1 tablet (40 mg total) by mouth daily at 6 PM.  Dispense: 90 tablet; Refill: 1  2. Essential hypertension Zachary Michael is working on healthy weight loss and exercise to improve blood pressure control. We will watch for signs of hypotension as he continues his lifestyle modifications.  Refill- carvedilol (COREG) 3.125 MG tablet; Take 1 tablet (3.125 mg total) by mouth daily.  Dispense: 90 tablet; Refill: 0  3. Obesity with current BMI of 31.3  Zachary Michael is currently in the action stage of change. As such, his goal is to continue with weight loss efforts. He has agreed to keeping a food journal and adhering to recommended goals of 1550 calories and 100+ grams protein.   Exercise goals:  As is  Behavioral modification strategies: increasing lean protein intake, meal planning and cooking strategies, keeping healthy foods in the home, and keeping a strict food journal.  Zachary Michael has agreed to follow-up with our clinic in 8 weeks. He was informed of the importance of frequent follow-up visits to maximize his success with intensive lifestyle modifications for his multiple health conditions.   Objective:   Blood pressure 102/62, pulse 64, temperature 97.8 F (36.6 C), height 5\' 9"  (1.753 m), weight 212 lb (96.2 kg), SpO2 99 %. Body mass index is 31.31 kg/m.  General: Cooperative, alert, well developed, in no acute distress. HEENT: Conjunctivae and lids unremarkable. Cardiovascular: Regular rhythm.  Lungs: Normal work of breathing. Neurologic: No focal deficits.   Lab Results  Component Value Date   CREATININE 0.84 12/03/2020   BUN 20 12/03/2020   NA 138  12/03/2020   K 4.3 12/03/2020   CL 103 12/03/2020   CO2 30 12/03/2020   Lab Results  Component Value Date   ALT 12 12/03/2020   AST 16 12/03/2020   ALKPHOS 33 (L)  12/03/2020   BILITOT 0.6 12/03/2020   Lab Results  Component Value Date   HGBA1C 7.6 (H) 12/03/2020   HGBA1C 6.7 (H) 06/02/2020   HGBA1C 6.5 (H) 12/03/2019   HGBA1C 6.8 (H) 05/28/2019   HGBA1C 7.1 (H) 03/07/2019   Lab Results  Component Value Date   INSULIN 6.3 03/07/2019   INSULIN 2.5 (L) 10/04/2018   INSULIN 4.3 05/07/2018   INSULIN 7.9 01/24/2018   Lab Results  Component Value Date   TSH 1.510 03/07/2019   Lab Results  Component Value Date   CHOL 147 12/03/2020   HDL 64.00 12/03/2020   LDLCALC 71 12/03/2020   LDLDIRECT 198.6 03/20/2009   TRIG 62.0 12/03/2020   CHOLHDL 2 12/03/2020   Lab Results  Component Value Date   VD25OH 81.04 12/03/2020   VD25OH 93.53 05/28/2019   VD25OH 58.0 03/07/2019   Lab Results  Component Value Date   WBC 6.5 12/03/2019   HGB 14.3 12/03/2019   HCT 42.0 12/03/2019   MCV 87.7 12/03/2019   PLT 301 12/03/2019   Lab Results  Component Value Date   IRON 82 03/07/2019   TIBC 375 03/07/2019   FERRITIN 161 03/07/2019    Attestation Statements:   Reviewed by clinician on day of visit: allergies, medications, problem list, medical history, surgical history, family history, social history, and previous encounter notes.  [name], CMA, am acting as transcriptionist for Reuben Likes, MD.   I have reviewed the above documentation for accuracy and completeness, and I agree with the above. - Reuben Likes, MD

## 2021-02-23 ENCOUNTER — Encounter (INDEPENDENT_AMBULATORY_CARE_PROVIDER_SITE_OTHER): Payer: Self-pay

## 2021-02-23 ENCOUNTER — Other Ambulatory Visit (INDEPENDENT_AMBULATORY_CARE_PROVIDER_SITE_OTHER): Payer: Self-pay | Admitting: Family Medicine

## 2021-02-23 DIAGNOSIS — E1165 Type 2 diabetes mellitus with hyperglycemia: Secondary | ICD-10-CM

## 2021-02-23 NOTE — Telephone Encounter (Signed)
Msg sent to pt 

## 2021-03-02 ENCOUNTER — Encounter (INDEPENDENT_AMBULATORY_CARE_PROVIDER_SITE_OTHER): Payer: Self-pay | Admitting: Family Medicine

## 2021-03-02 ENCOUNTER — Other Ambulatory Visit: Payer: Self-pay

## 2021-03-02 ENCOUNTER — Ambulatory Visit (INDEPENDENT_AMBULATORY_CARE_PROVIDER_SITE_OTHER): Payer: Medicare Other | Admitting: Family Medicine

## 2021-03-02 VITALS — BP 125/66 | HR 61 | Temp 98.0°F | Ht 69.0 in | Wt 216.0 lb

## 2021-03-02 DIAGNOSIS — I10 Essential (primary) hypertension: Secondary | ICD-10-CM | POA: Diagnosis not present

## 2021-03-02 DIAGNOSIS — Z6841 Body Mass Index (BMI) 40.0 and over, adult: Secondary | ICD-10-CM | POA: Diagnosis not present

## 2021-03-02 DIAGNOSIS — E1165 Type 2 diabetes mellitus with hyperglycemia: Secondary | ICD-10-CM | POA: Diagnosis not present

## 2021-03-02 MED ORDER — METFORMIN HCL 1000 MG PO TABS: 1000.0000 mg | ORAL_TABLET | Freq: Two times a day (BID) | ORAL | 0 refills | Status: DC

## 2021-03-02 MED ORDER — CARVEDILOL 3.125 MG PO TABS: 3.1250 mg | ORAL_TABLET | Freq: Every day | ORAL | 0 refills | Status: DC

## 2021-03-02 NOTE — Progress Notes (Signed)
Chief Complaint:   OBESITY Zachary Michael is here to discuss his progress with his obesity treatment plan along with follow-up of his obesity related diagnoses. Zachary Michael is on keeping a food journal and adhering to recommended goals of 1550 calories and 100+ grams protein and states he is following his eating plan approximately 70% of the time. Zachary Michael states he is walking 30 minutes 3 times per week.  Today's visit was #: 47 Starting weight: 301 lbs Starting date: 01/24/2018 Today's weight: 216 lbs Today's date: 03/02/2021 Total lbs lost to date: 85 Total lbs lost since last in-office visit: 0  Interim History: Pt has been doing mostly normal life- enjoyable holiday. He had a great dinner with his wife. Pt did well journaling prior to the holidays and has been trying to be mindful of protein intake. He has not been keeping track of calories as much. He thinks that with decrease in available desserts, it will be easier to control calories.  Subjective:   1. Type 2 diabetes mellitus with hyperglycemia, without long-term current use of insulin (HCC) Pt hasn't picked up Rybels[employer] yet due to finances. His last A1c was 7.6. he has some increased indulgences prior to his last blood draw.  2. Essential hypertension BP well controlled. Pt denies chest pain/chest pressure/headache.  Assessment/Plan:   1. Type 2 diabetes mellitus with hyperglycemia, without long-term current use of insulin (HCC) Good blood sugar control is important to decrease the likelihood of diabetic complications such as nephropathy, neuropathy, limb loss, blindness, coronary artery disease, and death. Intensive lifestyle modification including diet, exercise and weight loss are the first line of treatment for diabetes. Repeat A1c in March 2023.  Refill- metFORMIN (GLUCOPHAGE) 1000 MG tablet; Take 1 tablet (1,000 mg total) by mouth 2 (two) times daily with a meal.  Dispense: 180 tablet; Refill: 0  2. Essential  hypertension Dedrick is working on healthy weight loss and exercise to improve blood pressure control. We will watch for signs of hypotension as he continues his lifestyle modifications.  Refill- carvedilol (COREG) 3.125 MG tablet; Take 1 tablet (3.125 mg total) by mouth daily.  Dispense: 90 tablet; Refill: 0  3. Obesity with current BMI of 32.0  Zachary Michael is currently in the action stage of change. As such, his goal is to continue with weight loss efforts. He has agreed to keeping a food journal and adhering to recommended goals of 1550 calories and 100+ grams protein.   Exercise goals: All adults should avoid inactivity. Some physical activity is better than none, and adults who participate in any amount of physical activity gain some health benefits.  Behavioral modification strategies: increasing lean protein intake, meal planning and cooking strategies, keeping healthy foods in the home, and keeping a strict food journal.  Zachary Michael has agreed to follow-up with our clinic in 12-16 weeks. He was informed of the importance of frequent follow-up visits to maximize his success with intensive lifestyle modifications for his multiple health conditions.   Objective:   Blood pressure 125/66, pulse 61, temperature 98 F (36.7 C), height 5\' 9"  (1.753 m), weight 216 lb (98 kg), SpO2 98 %. Body mass index is 31.9 kg/m.  General: Cooperative, alert, well developed, in no acute distress. HEENT: Conjunctivae and lids unremarkable. Cardiovascular: Regular rhythm.  Lungs: Normal work of breathing. Neurologic: No focal deficits.   Lab Results  Component Value Date   CREATININE 0.84 12/03/2020   BUN 20 12/03/2020   NA 138 12/03/2020   K 4.3 12/03/2020  CL 103 12/03/2020   CO2 30 12/03/2020   Lab Results  Component Value Date   ALT 12 12/03/2020   AST 16 12/03/2020   ALKPHOS 33 (L) 12/03/2020   BILITOT 0.6 12/03/2020   Lab Results  Component Value Date   HGBA1C 7.6 (H) 12/03/2020    HGBA1C 6.7 (H) 06/02/2020   HGBA1C 6.5 (H) 12/03/2019   HGBA1C 6.8 (H) 05/28/2019   HGBA1C 7.1 (H) 03/07/2019   Lab Results  Component Value Date   INSULIN 6.3 03/07/2019   INSULIN 2.5 (L) 10/04/2018   INSULIN 4.3 05/07/2018   INSULIN 7.9 01/24/2018   Lab Results  Component Value Date   TSH 1.510 03/07/2019   Lab Results  Component Value Date   CHOL 147 12/03/2020   HDL 64.00 12/03/2020   LDLCALC 71 12/03/2020   LDLDIRECT 198.6 03/20/2009   TRIG 62.0 12/03/2020   CHOLHDL 2 12/03/2020   Lab Results  Component Value Date   VD25OH 81.04 12/03/2020   VD25OH 93.53 05/28/2019   VD25OH 58.0 03/07/2019   Lab Results  Component Value Date   WBC 6.5 12/03/2019   HGB 14.3 12/03/2019   HCT 42.0 12/03/2019   MCV 87.7 12/03/2019   PLT 301 12/03/2019   Lab Results  Component Value Date   IRON 82 03/07/2019   TIBC 375 03/07/2019   FERRITIN 161 03/07/2019    Obesity Behavioral Intervention:   Approximately 15 minutes were spent on the discussion below.  ASK: We discussed the diagnosis of obesity with Zachary Michael today and Zachary Michael agreed to give [name] permission to discuss obesity behavioral modification therapy today.  ASSESS: [name] has the diagnosis of obesity and his BMI today is 32.0. Zachary Michael is in the action stage of change.   ADVISE: Zachary Michael was educated on the multiple health risks of obesity as well as the benefit of weight loss to improve his health. He was advised of the need for long term treatment and the importance of lifestyle modifications to improve his current health and to decrease his risk of future health problems.  AGREE: Multiple dietary modification options and treatment options were discussed and Zachary Michael agreed to follow the recommendations documented in the above note.  ARRANGE: Zachary Michael was educated on the importance of frequent visits to treat obesity as outlined per CMS and USPSTF guidelines and agreed to schedule his next follow up appointment  today.  Attestation Statements:   Reviewed by clinician on day of visit: allergies, medications, problem list, medical history, surgical history, family history, social history, and previous encounter notes.  [name], CMA, am acting as transcriptionist for Zachary Likes, MD.   I have reviewed the above documentation for accuracy and completeness, and I agree with the above. - Zachary Likes, MD

## 2021-05-25 ENCOUNTER — Encounter (INDEPENDENT_AMBULATORY_CARE_PROVIDER_SITE_OTHER): Payer: Self-pay | Admitting: Family Medicine

## 2021-05-25 ENCOUNTER — Other Ambulatory Visit: Payer: Self-pay

## 2021-05-25 ENCOUNTER — Ambulatory Visit (INDEPENDENT_AMBULATORY_CARE_PROVIDER_SITE_OTHER): Payer: Medicare Other | Admitting: Family Medicine

## 2021-05-25 VITALS — BP 113/69 | HR 60 | Temp 97.8°F | Ht 69.0 in | Wt 215.0 lb

## 2021-05-25 DIAGNOSIS — F439 Reaction to severe stress, unspecified: Secondary | ICD-10-CM

## 2021-05-25 DIAGNOSIS — E1165 Type 2 diabetes mellitus with hyperglycemia: Secondary | ICD-10-CM

## 2021-05-25 DIAGNOSIS — Z6831 Body mass index (BMI) 31.0-31.9, adult: Secondary | ICD-10-CM

## 2021-05-25 DIAGNOSIS — E669 Obesity, unspecified: Secondary | ICD-10-CM

## 2021-05-26 NOTE — Progress Notes (Signed)
? ? ? ?Chief Complaint:  ? ?OBESITY ?Zachary Michael is here to discuss his progress with his obesity treatment plan along with follow-up of his obesity related diagnoses. Zachary Michael is on keeping a food journal and adhering to recommended goals of 1550 calories and 100+ grams of protein daily and states he is following his eating plan approximately 95% of the time. Zachary Michael states he is doing 0 minutes 0 times per week. ? ?Today's visit was #: 48 ?Starting weight: 301 lbs ?Starting date: 01/24/2018 ?Today's weight: 215 lbs ?Today's date: 05/25/2021 ?Total lbs lost to date: 3 ?Total lbs lost since last in-office visit: 1 ? ?Interim History: Zachary Michael continues to do well with journaling and meeting his calorie and protein goals. He hasn't been exercising much this Winter, but he has recently started to walk the dog 1 mile per day and he is starting to do yard work and gardening.  ? ?Subjective:  ? ?1. Type 2 diabetes mellitus with hyperglycemia, without long-term current use of insulin (HCC) ?Zachary Michael is followed by Dr. Zola Button. Last A1c was 7.6, but he continues to do well with diet and exercise. ? ?2. Stress ?Zachary Michael's grown daughter lives with them and she is affecting their routine, and this may be contributing to some emotional eating behaviors.  ? ?Assessment/Plan:  ? ?1. Type 2 diabetes mellitus with hyperglycemia, without long-term current use of insulin (HCC) ?Zachary Michael will continue his diet and weight loss, and decrease simple carbohydrates. ? ?2. Stress ?Zachary Michael will continue to mindful of his food choices and be mindful of stress eating. ? ?3. Obesity with current BMI of 31.7 ?Zachary Michael is currently in the action stage of change. As such, his goal is to continue with weight loss efforts. He has agreed to keeping a food journal and adhering to recommended goals of 1550 calories and 100+ grams of protein daily.  ? ?Exercise goal: As is. ? ?Behavioral modification strategies: increasing lean protein intake. ? ?Zachary Michael  has agreed to follow-up with our clinic in 12 weeks. He was informed of the importance of frequent follow-up visits to maximize his success with intensive lifestyle modifications for his multiple health conditions.  ? ?Objective:  ? ?Blood pressure 113/69, pulse 60, temperature 97.8 ?F (36.6 ?C), height 5\' 9"  (1.753 m), weight 215 lb (97.5 kg), SpO2 96 %. ?Body mass index is 31.75 kg/m?. ? ?General: Cooperative, alert, well developed, in no acute distress. ?HEENT: Conjunctivae and lids unremarkable. ?Cardiovascular: Regular rhythm.  ?Lungs: Normal work of breathing. ?Neurologic: No focal deficits.  ? ?Lab Results  ?Component Value Date  ? CREATININE 0.84 12/03/2020  ? BUN 20 12/03/2020  ? NA 138 12/03/2020  ? K 4.3 12/03/2020  ? CL 103 12/03/2020  ? CO2 30 12/03/2020  ? ?Lab Results  ?Component Value Date  ? ALT 12 12/03/2020  ? AST 16 12/03/2020  ? ALKPHOS 33 (L) 12/03/2020  ? BILITOT 0.6 12/03/2020  ? ?Lab Results  ?Component Value Date  ? HGBA1C 7.6 (H) 12/03/2020  ? HGBA1C 6.7 (H) 06/02/2020  ? HGBA1C 6.5 (H) 12/03/2019  ? HGBA1C 6.8 (H) 05/28/2019  ? HGBA1C 7.1 (H) 03/07/2019  ? ?Lab Results  ?Component Value Date  ? INSULIN 6.3 03/07/2019  ? INSULIN 2.5 (L) 10/04/2018  ? INSULIN 4.3 05/07/2018  ? INSULIN 7.9 01/24/2018  ? ?Lab Results  ?Component Value Date  ? TSH 1.510 03/07/2019  ? ?Lab Results  ?Component Value Date  ? CHOL 147 12/03/2020  ? HDL 64.00 12/03/2020  ? LDLCALC 71 12/03/2020  ?  LDLDIRECT 198.6 03/20/2009  ? TRIG 62.0 12/03/2020  ? CHOLHDL 2 12/03/2020  ? ?Lab Results  ?Component Value Date  ? VD25OH 81.04 12/03/2020  ? VD25OH 93.53 05/28/2019  ? VD25OH 58.0 03/07/2019  ? ?Lab Results  ?Component Value Date  ? WBC 6.5 12/03/2019  ? HGB 14.3 12/03/2019  ? HCT 42.0 12/03/2019  ? MCV 87.7 12/03/2019  ? PLT 301 12/03/2019  ? ?Lab Results  ?Component Value Date  ? IRON 82 03/07/2019  ? TIBC 375 03/07/2019  ? FERRITIN 161 03/07/2019  ? ?Attestation Statements:  ? ?Reviewed by clinician on day of visit:  allergies, medications, problem list, medical history, surgical history, family history, social history, and previous encounter notes. ? ?Time spent on visit including pre-visit chart review and post-visit care and charting was 42 minutes.  ? ? ?I, Burt Knack, am acting as transcriptionist for Quillian Quince, MD. ? ?I have reviewed the above documentation for accuracy and completeness, and I agree with the above. -  Quillian Quince, MD ? ? ?

## 2021-06-11 ENCOUNTER — Encounter: Payer: Medicare Other | Admitting: Family Medicine

## 2021-07-01 ENCOUNTER — Telehealth: Payer: Self-pay | Admitting: Family Medicine

## 2021-07-01 NOTE — Telephone Encounter (Signed)
Left message for patient to call back and schedule Medicare Annual Wellness Visit (AWV).  ? ?Please offer to do virtually or by telephone.  Left office number and my jabber [phone]. ? ?Last AWV:11/07/2016 ? ?Please schedule at anytime with Nurse Health Advisor. ?  ?

## 2021-07-30 ENCOUNTER — Telehealth: Payer: Self-pay | Admitting: Family Medicine

## 2021-07-30 NOTE — Telephone Encounter (Signed)
Left message for patient to call back and schedule Medicare Annual Wellness Visit (AWV).   Please offer to do virtually or by telephone.  Left office number and my jabber [phone].  Last AWV:11/07/2016  Please schedule at anytime with Nurse Health Advisor.

## 2021-08-16 ENCOUNTER — Ambulatory Visit (INDEPENDENT_AMBULATORY_CARE_PROVIDER_SITE_OTHER): Payer: Medicare Other | Admitting: Family Medicine

## 2021-08-16 ENCOUNTER — Encounter (INDEPENDENT_AMBULATORY_CARE_PROVIDER_SITE_OTHER): Payer: Self-pay | Admitting: Family Medicine

## 2021-08-16 VITALS — BP 120/64 | HR 50 | Temp 97.4°F | Ht 69.0 in | Wt 214.0 lb

## 2021-08-16 DIAGNOSIS — Z6831 Body mass index (BMI) 31.0-31.9, adult: Secondary | ICD-10-CM | POA: Diagnosis not present

## 2021-08-16 DIAGNOSIS — R7303 Prediabetes: Secondary | ICD-10-CM

## 2021-08-16 DIAGNOSIS — I10 Essential (primary) hypertension: Secondary | ICD-10-CM | POA: Diagnosis not present

## 2021-08-16 DIAGNOSIS — E669 Obesity, unspecified: Secondary | ICD-10-CM

## 2021-08-16 MED ORDER — CARVEDILOL 3.125 MG PO TABS: 3.1250 mg | ORAL_TABLET | Freq: Every day | ORAL | 0 refills | Status: DC

## 2021-08-16 MED ORDER — METFORMIN HCL 1000 MG PO TABS: 1000.0000 mg | ORAL_TABLET | Freq: Two times a day (BID) | ORAL | 0 refills | Status: DC

## 2021-08-16 NOTE — Progress Notes (Signed)
Chief Complaint:   OBESITY Zachary Michael is here to discuss his progress with his obesity treatment plan along with follow-up of his obesity related diagnoses. Zachary Michael is on keeping a food journal and adhering to recommended goals of 1550 calories and 100+ grams of protein daily and states he is following his eating plan approximately 95% of the time. [name] states he is doing yard work and walking the dog for 60 minutes 4 times per week.  Today's visit was #: 49 Starting weight: 301 lbs Starting date: 01/24/2018 Today's weight: 214 lbs Today's date: 08/16/2021 Total lbs lost to date: 87 Total lbs lost since last in-office visit: 1  Interim History: Zachary Michael continues to do well with weight loss. Our bioimpedance scale suggest his muscle mass is significantly lower than ideal, which is affecting his RMR and increasing future risks of falls and osteoporosis.   Subjective:   1. Pre-diabetes Zachary Michael is working on his diet and exercise, and he is due for labs soon. He denies GI upset with metformin.   2. Essential hypertension Zachary Michael's blood pressure is controlled with his medications and weight loss. No dizziness was noted.   Assessment/Plan:   1. Pre-diabetes We will refill metformin for 90 days, with no refills. [name] will continue to work on weight loss, exercise, and decreasing simple carbohydrates to help decrease the risk of diabetes.   - metFORMIN (GLUCOPHAGE) 1000 MG tablet; Take 1 tablet (1,000 mg total) by mouth 2 (two) times daily with a meal.  Dispense: 180 tablet; Refill: 0  2. Essential hypertension We will refill Coreg for 90 days, with no refills. [name] will continue working on his diet and exercise.  - carvedilol (COREG) 3.125 MG tablet; Take 1 tablet (3.125 mg total) by mouth daily.  Dispense: 90 tablet; Refill: 0  3. Obesity, Current BMI 31.6 Zachary Michael is currently in the action stage of change. As such, his goal is to continue with weight loss efforts. He has  agreed to keeping a food journal and adhering to recommended goals of 1500 calories and 100+ grams of protein daily.   We will recheck fasting labs at his next visit.  Exercise goals: As is.   Behavioral modification strategies: increasing lean protein intake and meal planning and cooking strategies.  Zachary Michael has agreed to follow-up with our clinic in 12 weeks. He was informed of the importance of frequent follow-up visits to maximize his success with intensive lifestyle modifications for his multiple health conditions.   Objective:   Blood pressure 120/64, pulse (!) 50, temperature (!) 97.4 F (36.3 C), height 5\' 9"  (1.753 m), weight 214 lb (97.1 kg), SpO2 98 %. Body mass index is 31.6 kg/m.  General: Cooperative, alert, well developed, in no acute distress. HEENT: Conjunctivae and lids unremarkable. Cardiovascular: Regular rhythm.  Lungs: Normal work of breathing. Neurologic: No focal deficits.   Lab Results  Component Value Date   CREATININE 0.84 12/03/2020   BUN 20 12/03/2020   NA 138 12/03/2020   K 4.3 12/03/2020   CL 103 12/03/2020   CO2 30 12/03/2020   Lab Results  Component Value Date   ALT 12 12/03/2020   AST 16 12/03/2020   ALKPHOS 33 (L) 12/03/2020   BILITOT 0.6 12/03/2020   Lab Results  Component Value Date   HGBA1C 7.6 (H) 12/03/2020   HGBA1C 6.7 (H) 06/02/2020   HGBA1C 6.5 (H) 12/03/2019   HGBA1C 6.8 (H) 05/28/2019   HGBA1C 7.1 (H) 03/07/2019   Lab Results  Component Value  Date   INSULIN 6.3 03/07/2019   INSULIN 2.5 (L) 10/04/2018   INSULIN 4.3 05/07/2018   INSULIN 7.9 01/24/2018   Lab Results  Component Value Date   TSH 1.510 03/07/2019   Lab Results  Component Value Date   CHOL 147 12/03/2020   HDL 64.00 12/03/2020   LDLCALC 71 12/03/2020   LDLDIRECT 198.6 03/20/2009   TRIG 62.0 12/03/2020   CHOLHDL 2 12/03/2020   Lab Results  Component Value Date   VD25OH 81.04 12/03/2020   VD25OH 93.53 05/28/2019   VD25OH 58.0 03/07/2019   Lab  Results  Component Value Date   WBC 6.5 12/03/2019   HGB 14.3 12/03/2019   HCT 42.0 12/03/2019   MCV 87.7 12/03/2019   PLT 301 12/03/2019   Lab Results  Component Value Date   IRON 82 03/07/2019   TIBC 375 03/07/2019   FERRITIN 161 03/07/2019   Attestation Statements:   Reviewed by clinician on day of visit: allergies, medications, problem list, medical history, surgical history, family history, social history, and previous encounter notes.  Time spent on visit including pre-visit chart review and post-visit care and charting was 44 minutes.   I, Burt Knack, am acting as transcriptionist for Quillian Quince, MD.  I have reviewed the above documentation for accuracy and completeness, and I agree with the above. -  Quillian Quince, MD

## 2021-10-04 ENCOUNTER — Ambulatory Visit (INDEPENDENT_AMBULATORY_CARE_PROVIDER_SITE_OTHER): Payer: Medicare Other

## 2021-10-04 VITALS — Ht 69.0 in | Wt 214.0 lb

## 2021-10-04 DIAGNOSIS — Z0000 Encounter for general adult medical examination without abnormal findings: Secondary | ICD-10-CM | POA: Diagnosis not present

## 2021-10-04 NOTE — Progress Notes (Signed)
Subjective:   Zachary Michael is a 71 y.o. male who presents for Medicare Annual/Subsequent preventive examination.  I connected with [employer] today by telephone and verified that I am speaking with the correct person using two identifiers. Location patient: home Location provider: work Persons participating in the virtual visit: patient, Engineer, civil (consulting).    I discussed the limitations, risks, security and privacy concerns of performing an evaluation and management service by telephone and the availability of in person appointments. I also discussed with the patient that there may be a patient responsible charge related to this service. The patient expressed understanding and verbally consented to this telephonic visit.    Interactive audio and video telecommunications were attempted between this provider and patient, however failed, due to patient having technical difficulties OR patient did not have access to video capability.  We continued and completed visit with audio only.  Some vital signs may be absent or patient reported.   Time Spent with patient on telephone encounter: 20 minutes   Review of Systems     Cardiac Risk Factors include: advanced age (>19men, >30 women);hypertension;diabetes mellitus;male gender;dyslipidemia;obesity (BMI >30kg/m2)     Objective:    Today's Vitals   10/04/21 1036  Weight: 214 lb (97.1 kg)  Height: 5\' 9"  (1.753 m)   Body mass index is 31.6 kg/m.     10/04/2021   10:40 AM 05/11/2015    9:25 AM  Advanced Directives  Does Patient Have a Medical Advance Directive? No No  Would patient like information on creating a medical advance directive?  Yes - Educational materials given    Current Medications (verified) Outpatient Encounter Medications as of 10/04/2021  Medication Sig   aspirin 81 MG tablet Take 81 mg by mouth daily.   b complex vitamins tablet Take 1 tablet by mouth daily.   calcium carbonate (OS-CAL) 600 MG TABS tablet Take 600 mg by mouth  daily with breakfast.   carvedilol (COREG) 3.125 MG tablet Take 1 tablet (3.125 mg total) by mouth daily.   cholecalciferol (VITAMIN D) 25 MCG (1000 UNIT) tablet Take 1 tablet (1,000 Units total) by mouth daily.   Cinnamon 500 MG capsule Take 500 mg by mouth daily.   fenofibrate 160 MG tablet Take 1 tablet (160 mg total) by mouth daily.   ferrous sulfate 325 (65 FE) MG EC tablet Take 325 mg by mouth 3 (three) times daily with meals.   metFORMIN (GLUCOPHAGE) 1000 MG tablet Take 1 tablet (1,000 mg total) by mouth 2 (two) times daily with a meal.   Multiple Vitamin (MULTIVITAMIN) capsule Take 1 capsule by mouth daily.   nitroGLYCERIN (NITROSTAT) 0.4 MG SL tablet Place 1 tablet (0.4 mg total) under the tongue every 5 (five) minutes as needed for chest pain.   Omega-3 Fatty Acids (FISH OIL) 1000 MG CAPS 1 po qd   simvastatin (ZOCOR) 40 MG tablet Take 1 tablet (40 mg total) by mouth daily at 6 PM.   Zinc 100 MG TABS 1 po qd   No facility-administered encounter medications on file as of 10/04/2021.    Allergies (verified) Patient has no known allergies.   History: Past Medical History:  Diagnosis Date   Asthma    Back pain    CAD (coronary artery disease)    Mild   Diabetes mellitus    Heart valve problem    HTN (hypertension)    Hyperlipidemia    Joint pain    OSA (obstructive sleep apnea) 08/09/2017   Palpitations  SOB (shortness of breath)    Subluxation    L2-3-4   Past Surgical History:  Procedure Laterality Date   APPENDECTOMY     CARDIAC CATHETERIZATION     We recently performed a which revealed minor coronary artery irregularities. He had normal left ventricle systolic function with an EF of 60%   CARDIOVASCULAR STRESS TEST     EF 63%   TONSILLECTOMY     TUMOR REMOVAL     LEFT ARM   Family History  Problem Relation Age of Onset   Pneumonia Mother    Osteoporosis Mother    Arthritis Mother    Diabetes Mother    Hypertension Mother    Cancer Father         leukemia, lung   Cancer Brother    Diabetes Maternal Aunt    Hypertension Maternal Aunt    Hypertension Maternal Aunt    Lung cancer Unknown    Social History   Socioeconomic History   Marital status: Married    Spouse name: Joaquim Lai   Number of children: 3   Years of education: Not on file   Highest education level: Not on file  Occupational History   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 [employer]    Comment: RETIRED  Tobacco Use   Smoking status: Former    Types: Pipe    Quit date: 1980    Years since quitting: 43.6   Smokeless tobacco: Never  Vaping Use   Vaping Use: Never used  Substance and Sexual Activity   Alcohol use: Yes    Comment: occassional   Drug use: No   Sexual activity: Yes    Partners: Female  Other Topics Concern   Not on file  Social History Narrative   Exercise-- walking at work   Social Determinants of Health   Financial Resource Strain: [place]  (10/04/2021)   Overall Financial Resource Strain (CARDIA)    Difficulty of Paying Living Expenses: Not hard at all  Food Insecurity: No Food Insecurity (10/04/2021)   Hunger Vital Sign    Worried About Running Out of Food in the Last Year: Never true    [place] in the Last Year: Never true  Transportation Needs: No Transportation Needs (10/04/2021)   PRAPARE - Hydrologist (Medical): No    Lack of Transportation (Non-Medical): No  Physical Activity: Sufficiently Active (10/04/2021)   Exercise Vital Sign    Days of Exercise per Week: 7 days    Minutes of Exercise per Session: 30 min  Stress: No Stress Concern Present (10/04/2021)   Helper    Feeling of Stress : Not at all  Social Connections: Socially Isolated (10/04/2021)   Social Connection and Isolation Panel [NHANES]    Frequency of Communication with Friends and Family: Never    Frequency of Social Gatherings with Friends and Family: Once a week    Attends  Religious Services: Never    Marine scientist or Organizations: No    Attends Music therapist: Never    Marital Status: Married    Tobacco Counseling Counseling given: Not Answered   Clinical Intake:  Pre-visit preparation completed: Yes  Pain : No/denies pain (c/o chronic mild arthritis pain)     BMI - recorded: 31.6 Nutritional Status: BMI > 30  Obese Nutritional Risks: None Diabetes: Yes CBG done?: No Did pt. bring in CBG monitor from home?: No (phone visit)  How often do you need to have someone help you when you read instructions, pamphlets, or other written materials from your doctor or pharmacy?: 1 - Never  Diabetes:  Is the patient diabetic?  Yes  If diabetic, was a CBG obtained today?  No  Did the patient bring in their glucometer from home?  No phone visit How often do you monitor your CBG's? never.   Financial Strains and Diabetes Management:  Are you having any financial strains with the device, your supplies or your medication? No .  Does the patient want to be seen by Chronic Care Management for management of their diabetes?  No  Would the patient like to be referred to a Nutritionist or for Diabetic Management?  No   Diabetic Exams:  Diabetic Eye Exam: . Overdue for diabetic eye exam. Pt has been advised about the importance in completing this exam.  Diabetic Foot Exam: Completed 12/03/2020.    Interpreter Needed?: No  Information entered by :: Thomasenia Sales LPN   Activities of Daily Living    10/04/2021   10:43 AM 12/03/2020    8:55 AM  In your present state of health, do you have any difficulty performing the following activities:  Hearing? 0 0  Vision? 0 0  Difficulty concentrating or making decisions? 0 0  Walking or climbing stairs? 0 0  Dressing or bathing? 0 0  Doing errands, shopping? 0 0  Preparing Food and eating ? N   Using the Toilet? N   In the past six months, have you accidently leaked urine? N   Do you  have problems with loss of bowel control? N   Managing your Medications? N   Managing your Finances? N   Housekeeping or managing your Housekeeping? N     Patient Care Team: Zola Button, Grayling Congress, DO as PCP - General Norva Pavlov, OD as Consulting Physician (Optometry) Nahser, Deloris Ping, MD as Consulting Physician (Cardiology)  Indicate any recent Medical Services you may have received from other than [facility] providers in the past year (date may be approximate).     Assessment:   This is a routine wellness examination for Sirron.  Hearing/Vision screen Hearing Screening - Comments:: C/o mild hearing loss &amp; tinniitus Vision Screening - Comments:: Last eye exam-3 years ago  Dietary issues and exercise activities discussed: Current Exercise Habits: Home exercise routine, Type of exercise: walking, Intensity: Mild, Exercise limited by: None identified   Goals Addressed             This Visit's Progress    Patient Stated       Continue eating healthy with Healthy Weight [facility] Well[facility] & drink lots of water & increase walking       Depression Screen    10/04/2021   10:43 AM 12/03/2020    8:54 AM 12/03/2019    9:33 AM 01/24/2018   10:38 AM 11/13/2017    9:10 AM 11/07/2016    9:37 AM 05/12/2016    9:22 AM  PHQ 2/9 Scores  PHQ - 2 Score 0 0 0 4 0 0 0  PHQ- 9 Score    10 0    Exception Documentation    Medical reason       Fall Risk    10/04/2021   10:41 AM 12/03/2020    8:54 AM 12/03/2019    9:33 AM 11/13/2017    8:37 AM 11/07/2016    9:37 AM  Fall Risk   Falls in the past year?  0 1 0 No Yes  Number falls in past yr: 0 0 0  1  Injury with Fall? 0 0 0  Yes  Risk for fall due to :     Other (Comment)  Risk for fall due to: Comment     he was on a step stool washing truck and fell  Follow up Falls prevention discussed Falls evaluation completed Falls evaluation completed  Falls prevention discussed    FALL RISK PREVENTION PERTAINING TO THE HOME:  Any stairs in or around  the home? Yes  If so, are there any without handrails? No  Home free of loose throw rugs in walkways, pet beds, electrical cords, etc? Yes  Adequate lighting in your home to reduce risk of falls? Yes   ASSISTIVE DEVICES UTILIZED TO PREVENT FALLS:  Life alert? No  Use of a cane, walker or w/c? No  Grab bars in the bathroom? Yes  Shower chair or bench in shower? Yes  Elevated toilet seat or a handicapped toilet? No   TIMED UP AND GO:  Was the test performed? No . Phone visit   Cognitive Function:Normal cognitive status assessed by this Nurse Health Advisor. No abnormalities found.      11/07/2016    9:39 AM  MMSE - Mini Mental State Exam  Orientation to time 5  Orientation to Place 5  Registration 3  Attention/ Calculation 5  Recall 3  Language- name 2 objects 2  Language- repeat 1  Language- follow 3 step command 3  Language- read & follow direction 1  Write a sentence 1  Copy design 1  Total score 30        Immunizations Immunization History  Administered Date(s) Administered   Pneumococcal Conjugate-13 05/11/2015   Pneumococcal Polysaccharide-23 01/07/2013   Tdap 09/23/2010   Zoster, Live 09/23/2010    TDAP status: Due, Education has been provided regarding the importance of this vaccine. Advised may receive this vaccine at local pharmacy or Health Dept. Aware to provide a copy of the vaccination record if obtained from local pharmacy or Health Dept. Verbalized acceptance and understanding.  Flu Vaccine status: Declined, Education has been provided regarding the importance of this vaccine but patient still declined. Advised may receive this vaccine at local pharmacy or Health Dept. Aware to provide a copy of the vaccination record if obtained from local pharmacy or Health Dept. Verbalized acceptance and understanding.  Pneumococcal vaccine status: Due, Education has been provided regarding the importance of this vaccine. Advised may receive this vaccine at local  pharmacy or Health Dept. Aware to provide a copy of the vaccination record if obtained from local pharmacy or Health Dept. Verbalized acceptance and understanding.  Covid-19 vaccine status: Information provided on how to obtain vaccines.   Qualifies for Shingles Vaccine? Yes   Zostavax completed Yes   Shingrix Completed?: No.    Education has been provided regarding the importance of this vaccine. Patient has been advised to call insurance company to determine out of pocket expense if they have not yet received this vaccine. Advised may also receive vaccine at local pharmacy or Health Dept. Verbalized acceptance and understanding.  Screening Tests Health Maintenance  Topic Date Due   COVID-19 Vaccine (1) Never done   Zoster Vaccines- Shingrix (1 of 2) Never done   OPHTHALMOLOGY EXAM  08/20/2015   COLONOSCOPY (Pts 45-32yrs Insurance coverage will need to be confirmed)  09/23/2015   Pneumonia Vaccine 66+ Years old (3 - PPSV23 or PCV20) 01/07/2018  TETANUS/TDAP  09/22/2020   HEMOGLOBIN A1C  06/03/2021   URINE MICROALBUMIN  12/03/2021   INFLUENZA VACCINE  07/03/2023 (Originally 09/28/2021)   Hepatitis C Screening  11/14/2023 (Originally 11/30/1968)   FOOT EXAM  12/03/2021   HPV VACCINES  Aged Out    Health Maintenance  Health Maintenance Due  Topic Date Due   COVID-19 Vaccine (1) Never done   Zoster Vaccines- Shingrix (1 of 2) Never done   OPHTHALMOLOGY EXAM  08/20/2015   COLONOSCOPY (Pts 45-41yrs Insurance coverage will need to be confirmed)  09/23/2015   Pneumonia Vaccine 56+ Years old (3 - PPSV23 or PCV20) 01/07/2018   TETANUS/TDAP  09/22/2020   HEMOGLOBIN A1C  06/03/2021   URINE MICROALBUMIN  12/03/2021    Colorectal cancer screening: Due-Declined  Lung Cancer Screening: (Low Dose CT Chest recommended if Age 40-80 years, 30 pack-year currently smoking OR have quit w/in 15years.) does not qualify.     Additional Screening:  Hepatitis C Screening: does  qualify-Declined  Vision Screening: Recommended annual ophthalmology exams for early detection of glaucoma and other disorders of the eye. Is the patient up to date with their annual eye exam?  No  Who is the provider or what is the name of the office in which the patient attends annual eye exams? unknown  Dental Screening: Recommended annual dental exams for proper oral hygiene  Community Resource Referral / Chronic Care Management: CRR required this visit?  No   CCM required this visit?  No      Plan:     I have personally reviewed and noted the following in the patient's chart:   Medical and social history Use of alcohol, tobacco or illicit drugs  Current medications and supplements including opioid prescriptions. Patient is not currently taking opioid prescriptions. Functional ability and status Nutritional status Physical activity Advanced directives List of other physicians Hospitalizations, surgeries, and ER visits in previous 12 months Vitals Screenings to include cognitive, depression, and falls Referrals and appointments  In addition, I have reviewed and discussed with patient certain preventive protocols, quality metrics, and best practice recommendations. A written personalized care plan for preventive services as well as general preventive health recommendations were provided to patient.   Due to this being a telephonic visit, the after visit summary with patients personalized plan was offered to patient via mail or my-chart. Patient would like to access on my-chart.  Marta Antu, LPN   [id number]  Nurse Health Advisor  Nurse Notes: None

## 2021-10-04 NOTE — Patient Instructions (Signed)
Zachary Michael , Thank you for taking time to complete your Medicare Wellness Visit. I appreciate your ongoing commitment to your health goals. Please review the following plan we discussed and let me know if I can assist you in the future.   Screening recommendations/referrals: Colonoscopy: Due-Declined today Recommended yearly ophthalmology/optometry visit for glaucoma screening and checkup Recommended yearly dental visit for hygiene and checkup  Vaccinations: Influenza vaccine: Declined Pneumococcal vaccine: Due-May obtain vaccine at our office or your local pharmacy. Tdap vaccine: Due-May obtain vaccine at your local pharmacy. Shingles vaccine: Due-May obtain vaccine at your local pharmacy.   Covid-19: Declined  Advanced directives: May pick up information at your next visit  Conditions/risks identified: See problem list  Next appointment: Follow up in one year for your annual wellness visit.   Preventive Care 26 Years and Older, Male Preventive care refers to lifestyle choices and visits with your health care provider that can promote health and wellness. What does preventive care include? A yearly physical exam. This is also called an annual well check. Dental exams once or twice a year. Routine eye exams. Ask your health care provider how often you should have your eyes checked. Personal lifestyle choices, including: Daily care of your teeth and gums. Regular physical activity. Eating a healthy diet. Avoiding tobacco and drug use. Limiting alcohol use. Practicing safe sex. Taking low doses of aspirin every day. Taking vitamin and mineral supplements as recommended by your health care provider. What happens during an annual well check? The services and screenings done by your health care provider during your annual well check will depend on your age, overall health, lifestyle risk factors, and family history of disease. Counseling  Your health care provider may ask you  questions about your: Alcohol use. Tobacco use. Drug use. Emotional well-being. Home and relationship well-being. Sexual activity. Eating habits. History of falls. Memory and ability to understand (cognition). Work and work Astronomer. Screening  You may have the following tests or measurements: Height, weight, and BMI. Blood pressure. Lipid and cholesterol levels. These may be checked every 5 years, or more frequently if you are over 73 years old. Skin check. Lung cancer screening. You may have this screening every year starting at age 54 if you have a 30-pack-year history of smoking and currently smoke or have quit within the past 15 years. Fecal occult blood test (FOBT) of the stool. You may have this test every year starting at age 34. Flexible sigmoidoscopy or colonoscopy. You may have a sigmoidoscopy every 5 years or a colonoscopy every 10 years starting at age 82. Prostate cancer screening. Recommendations will vary depending on your family history and other risks. Hepatitis C blood test. Hepatitis B blood test. Sexually transmitted disease (STD) testing. Diabetes screening. This is done by checking your blood sugar (glucose) after you have not eaten for a while (fasting). You may have this done every 1-3 years. Abdominal aortic aneurysm (AAA) screening. You may need this if you are a current or former smoker. Osteoporosis. You may be screened starting at age 60 if you are at high risk. Talk with your health care provider about your test results, treatment options, and if necessary, the need for more tests. Vaccines  Your health care provider may recommend certain vaccines, such as: Influenza vaccine. This is recommended every year. Tetanus, diphtheria, and acellular pertussis (Tdap, Td) vaccine. You may need a Td booster every 10 years. Zoster vaccine. You may need this after age 49. Pneumococcal 13-valent conjugate (PCV13) vaccine.  One dose is recommended after age  54. Pneumococcal polysaccharide (PPSV23) vaccine. One dose is recommended after age 80. Talk to your health care provider about which screenings and vaccines you need and how often you need them. This information is not intended to replace advice given to you by your health care provider. Make sure you discuss any questions you have with your health care provider. Document Released: 03/13/2015 Document Revised: 11/04/2015 Document Reviewed: 12/16/2014 Elsevier Interactive Patient Education  2017 [place] Prevention in the Home Falls can cause injuries. They can happen to people of all ages. There are many things you can do to make your home safe and to help prevent falls. What can I do on the outside of my home? Regularly fix the edges of walkways and driveways and fix any cracks. Remove anything that might make you trip as you walk through a door, such as a raised step or threshold. Trim any bushes or trees on the path to your home. Use bright outdoor lighting. Clear any walking paths of anything that might make someone trip, such as rocks or tools. Regularly check to see if handrails are loose or broken. Make sure that both sides of any steps have handrails. Any raised decks and porches should have guardrails on the edges. Have any leaves, snow, or ice cleared regularly. Use sand or salt on walking paths during winter. Clean up any spills in your garage right away. This includes oil or grease spills. What can I do in the bathroom? Use night lights. Install grab bars by the toilet and in the tub and shower. Do not use towel bars as grab bars. Use non-skid mats or decals in the tub or shower. If you need to sit down in the shower, use a plastic, non-slip stool. Keep the floor dry. Clean up any water that spills on the floor as soon as it happens. Remove soap buildup in the tub or shower regularly. Attach bath mats securely with double-sided non-slip rug tape. Do not have throw  rugs and other things on the floor that can make you trip. What can I do in the bedroom? Use night lights. Make sure that you have a light by your bed that is easy to reach. Do not use any sheets or blankets that are too big for your bed. They should not hang down onto the floor. Have a firm chair that has side arms. You can use this for support while you get dressed. Do not have throw rugs and other things on the floor that can make you trip. What can I do in the kitchen? Clean up any spills right away. Avoid walking on wet floors. Keep items that you use a lot in easy-to-reach places. If you need to reach something above you, use a strong step stool that has a grab bar. Keep electrical cords out of the way. Do not use floor polish or wax that makes floors slippery. If you must use wax, use non-skid floor wax. Do not have throw rugs and other things on the floor that can make you trip. What can I do with my stairs? Do not leave any items on the stairs. Make sure that there are handrails on both sides of the stairs and use them. Fix handrails that are broken or loose. Make sure that handrails are as long as the stairways. Check any carpeting to make sure that it is firmly attached to the stairs. Fix any carpet that is loose or  worn. Avoid having throw rugs at the top or bottom of the stairs. If you do have throw rugs, attach them to the floor with carpet tape. Make sure that you have a light switch at the top of the stairs and the bottom of the stairs. If you do not have them, ask someone to add them for you. What else can I do to help prevent falls? Wear shoes that: Do not have high heels. Have rubber bottoms. Are comfortable and fit you well. Are closed at the toe. Do not wear sandals. If you use a stepladder: Make sure that it is fully opened. Do not climb a closed stepladder. Make sure that both sides of the stepladder are locked into place. Ask someone to hold it for you, if  possible. Clearly mark and make sure that you can see: Any grab bars or handrails. First and last steps. Where the edge of each step is. Use tools that help you move around (mobility aids) if they are needed. These include: Canes. Walkers. Scooters. Crutches. Turn on the lights when you go into a dark area. Replace any light bulbs as soon as they burn out. Set up your furniture so you have a clear path. Avoid moving your furniture around. If any of your floors are uneven, fix them. If there are any pets around you, be aware of where they are. Review your medicines with your doctor. Some medicines can make you feel dizzy. This can increase your chance of falling. Ask your doctor what other things that you can do to help prevent falls. This information is not intended to replace advice given to you by your health care provider. Make sure you discuss any questions you have with your health care provider. Document Released: 12/11/2008 Document Revised: 07/23/2015 Document Reviewed: 03/21/2014 Elsevier Interactive Patient Education  2017 [employer].

## 2021-10-06 ENCOUNTER — Encounter (INDEPENDENT_AMBULATORY_CARE_PROVIDER_SITE_OTHER): Payer: Self-pay

## 2021-11-16 ENCOUNTER — Ambulatory Visit (INDEPENDENT_AMBULATORY_CARE_PROVIDER_SITE_OTHER): Payer: Medicare Other | Admitting: Family Medicine

## 2021-11-16 ENCOUNTER — Encounter (INDEPENDENT_AMBULATORY_CARE_PROVIDER_SITE_OTHER): Payer: Self-pay | Admitting: Family Medicine

## 2021-11-16 VITALS — BP 110/56 | HR 57 | Temp 97.4°F | Ht 69.0 in | Wt 211.0 lb

## 2021-11-16 DIAGNOSIS — E669 Obesity, unspecified: Secondary | ICD-10-CM

## 2021-11-16 DIAGNOSIS — Z7984 Long term (current) use of oral hypoglycemic drugs: Secondary | ICD-10-CM | POA: Diagnosis not present

## 2021-11-16 DIAGNOSIS — E1169 Type 2 diabetes mellitus with other specified complication: Secondary | ICD-10-CM

## 2021-11-16 DIAGNOSIS — R42 Dizziness and giddiness: Secondary | ICD-10-CM | POA: Diagnosis not present

## 2021-11-16 DIAGNOSIS — I10 Essential (primary) hypertension: Secondary | ICD-10-CM

## 2021-11-16 DIAGNOSIS — Z6831 Body mass index (BMI) 31.0-31.9, adult: Secondary | ICD-10-CM

## 2021-11-16 MED ORDER — CARVEDILOL 3.125 MG PO TABS: 3.1250 mg | ORAL_TABLET | Freq: Every day | ORAL | 0 refills | Status: DC

## 2021-11-23 ENCOUNTER — Other Ambulatory Visit (INDEPENDENT_AMBULATORY_CARE_PROVIDER_SITE_OTHER): Payer: Self-pay | Admitting: Family Medicine

## 2021-11-23 DIAGNOSIS — R7303 Prediabetes: Secondary | ICD-10-CM

## 2021-11-23 NOTE — Progress Notes (Signed)
Chief Complaint:   OBESITY Zachary Michael is here to discuss his progress with his obesity treatment plan along with follow-up of his obesity related diagnoses. Zachary Michael is on keeping a food journal and adhering to recommended goals of 1500 calories and 100+ grams of protein daily and states he is following his eating plan approximately 80% of the time. Zachary Michael states he is doing yard work, and walking for 80 minutes 5-7 times per week.  Today's visit was #: 50 Starting weight: 301 lbs Starting date: 01/24/2018 Today's weight: 211 lbs Today's date: 11/16/2021 Total lbs lost to date: 90 Total lbs lost since last in-office visit: 3  Interim History: Zachary Michael continues to do well with his weight loss. He is staying active and he is trying to meet his calories and protein goals.   Subjective:   1. Essential hypertension Zachary Michael's blood pressure is on the low end of normal. He is only on low dose Coreg for arrhythmia.   2. Type 2 diabetes mellitus with other specified complication, without long-term current use of insulin (HCC) Zachary Michael is on metformin, and he is working on his diet and exercise. He is doing well on his metformin.   3. Vertigo Zachary Michael has tinnitus and he notes short episodes of feeling the room spin.   Assessment/Plan:   1. Essential hypertension Zachary Michael will continue Coreg 1 tablet daily, and we will refill for 90 days. He will increase his water intake.   - carvedilol (COREG) 3.125 MG tablet; Take 1 tablet (3.125 mg total) by mouth daily.  Dispense: 90 tablet; Refill: 0  2. Type 2 diabetes mellitus with other specified complication, without long-term current use of insulin (HCC) Zachary Michael will continue metformin and his diet, and we will continue to follow.   3. Vertigo Zachary Michael was educated on symptoms of vertigo versus lightheadedness. He is to increase his water intake and discuss his symptoms with Dr. Laury Axon.   4. Obesity, Current BMI 31.2 Zachary Michael is currently in  the action stage of change. As such, his goal is to continue with weight loss efforts. He has agreed to keeping a food journal and adhering to recommended goals of 1500 calories and 90+ grams of protein daily.   Exercise goals: As is.  Behavioral modification strategies: meal planning and cooking strategies.  Zachary Michael has agreed to follow-up with our clinic in 12 weeks. He was informed of the importance of frequent follow-up visits to maximize his success with intensive lifestyle modifications for his multiple health conditions.   Objective:   Blood pressure (!) 110/56, pulse (!) 57, temperature (!) 97.4 F (36.3 C), height 5\' 9"  (1.753 m), weight 211 lb (95.7 kg), SpO2 98 %. Body mass index is 31.16 kg/m.  General: Cooperative, alert, well developed, in no acute distress. HEENT: Conjunctivae and lids unremarkable. Cardiovascular: Regular rhythm.  Lungs: Normal work of breathing. Neurologic: No focal deficits.   Lab Results  Component Value Date   CREATININE 0.84 12/03/2020   BUN 20 12/03/2020   NA 138 12/03/2020   K 4.3 12/03/2020   CL 103 12/03/2020   CO2 30 12/03/2020   Lab Results  Component Value Date   ALT 12 12/03/2020   AST 16 12/03/2020   ALKPHOS 33 (L) 12/03/2020   BILITOT 0.6 12/03/2020   Lab Results  Component Value Date   HGBA1C 7.6 (H) 12/03/2020   HGBA1C 6.7 (H) 06/02/2020   HGBA1C 6.5 (H) 12/03/2019   HGBA1C 6.8 (H) 05/28/2019   HGBA1C 7.1 (H) 03/07/2019  Lab Results  Component Value Date   INSULIN 6.3 03/07/2019   INSULIN 2.5 (L) 10/04/2018   INSULIN 4.3 05/07/2018   INSULIN 7.9 01/24/2018   Lab Results  Component Value Date   TSH 1.510 03/07/2019   Lab Results  Component Value Date   CHOL 147 12/03/2020   HDL 64.00 12/03/2020   LDLCALC 71 12/03/2020   LDLDIRECT 198.6 03/20/2009   TRIG 62.0 12/03/2020   CHOLHDL 2 12/03/2020   Lab Results  Component Value Date   VD25OH 81.04 12/03/2020   VD25OH 93.53 05/28/2019   VD25OH 58.0  03/07/2019   Lab Results  Component Value Date   WBC 6.5 12/03/2019   HGB 14.3 12/03/2019   HCT 42.0 12/03/2019   MCV 87.7 12/03/2019   PLT 301 12/03/2019   Lab Results  Component Value Date   IRON 82 03/07/2019   TIBC 375 03/07/2019   FERRITIN 161 03/07/2019    Obesity Behavioral Intervention:   Approximately 15 minutes were spent on the discussion below.  ASK: We discussed the diagnosis of obesity with Zachary Michael today and Zachary Michael agreed to give [name] permission to discuss obesity behavioral modification therapy today.  ASSESS: Zachary Michael has the diagnosis of obesity and his BMI today is 31.2. Zachary Michael is in the action stage of change.   ADVISE: Zachary Michael was educated on the multiple health risks of obesity as well as the benefit of weight loss to improve his health. He was advised of the need for long term treatment and the importance of lifestyle modifications to improve his current health and to decrease his risk of future health problems.  AGREE: Multiple dietary modification options and treatment options were discussed and Zachary Michael agreed to follow the recommendations documented in the above note.  ARRANGE: Zachary Michael was educated on the importance of frequent visits to treat obesity as outlined per CMS and USPSTF guidelines and agreed to schedule his next follow up appointment today.  Attestation Statements:   Reviewed by clinician on day of visit: allergies, medications, problem list, medical history, surgical history, family history, social history, and previous encounter notes.   I, Trixie Dredge, am acting as transcriptionist for Dennard Nip, MD.  I have reviewed the above documentation for accuracy and completeness, and I agree with the above. -  Dennard Nip, MD

## 2021-12-22 ENCOUNTER — Telehealth: Payer: Self-pay | Admitting: *Deleted

## 2021-12-22 ENCOUNTER — Encounter: Payer: Self-pay | Admitting: *Deleted

## 2021-12-22 NOTE — Patient Instructions (Addendum)
Visit Information  Thank you for taking time to visit with me today. Please don't hesitate to contact me if I can be of assistance to you.   Following are the goals we discussed today:   Goals Addressed             This Visit's Progress    Care Coordination Activities: No follow up required   On track    Care Coordination Interventions: Evaluation of current treatment plan related to DMII, obesity and patient's adherence to plan as established by provider Advised patient to consider scheduling appointment with PCP asap- last office visit noted 12/03/20 Provided education to patient re: resources for grief counseling, need to promptly visit with PCP should he feel his grief/ sadness is worsening or not improving with time Reviewed scheduled/upcoming provider appointments including 02/08/22- [facility] Advised patient to discuss need for updated A1-C lab with provider Screening for signs and symptoms of depression related to chronic disease state  Assessed social determinant of health barriers Confirmed patient completed Medicare Annual Wellness Visit on 10/04/21; confirmed patient does not take flu vaccines- personal preference; emotional support provided around recent loss of spouse; provided educational material through [place] around grief management          If you are experiencing a Mental Health or [place] or need someone to talk to, please  call the Suicide and Crisis Lifeline: 988 call the [place] National Suicide Prevention Lifeline: (705)754-7637 or TTY: 9516540559 TTY 3465771709) to talk to a trained counselor call 1-800-273-TALK (toll free, 24 hour hotline) go to [facility] [address], [place] [phone]) call the [place]: [phone] call 911   Patient verbalizes understanding of instructions and care plan provided today and agrees to view in [place]. Active  MyChart status and patient understanding of how to access instructions and care plan via MyChart confirmed with patient.     No further follow up required: patient declined need for ongoing care coordination outreach and was provided with my direct phone number should needs arise in the future  Oneta Rack, RN, BSN, [place] Coordination/ Transition of [place] [facility] [phone]: direct office

## 2021-12-22 NOTE — Patient Outreach (Addendum)
  Care Coordination   Initial Visit Note   12/22/2021 Name: Zachary Michael MRN: [id number] DOB: 03-21-1950  Zachary Michael is a 71 y.o. year old male who sees Zachary Michael, Zachary Apa, DO for primary care. I spoke with  Zachary Michael by phone today.  What matters to the patients health and wellness today?  "I am doing okay.... I have been followed closely by the doctor [facility], which is why I have not seen Dr. Carollee Michael recently-- my wife had been sick and I was managing her health care needs, which were extensive.... I don't think you know this, but my wife just passed away this weekend.  I am doing okay right now, not struggling with grief, and I have a lot of family support.... but if I feel that I am starting to get depressed and can't get out of it, I will talk to Dr. Carollee Michael or I will call you back... but right now, I am doing okay"  Interventions provided; patient declined current need for ongoing care coordination needs, however, I provided my direct phone number to him should needs arise in the future and encouraged him to call as/ if needed; extensive emotional support provided throughout today's call   Goals Addressed             This Visit's Progress    Care Coordination Activities: No follow up required   On track    Care Coordination Interventions: Evaluation of current treatment plan related to DMII, obesity and patient's adherence to plan as established by provider Advised patient to consider scheduling appointment with PCP asap- last office visit noted 12/03/20 Provided education to patient re: resources for grief counseling, need to promptly visit with PCP should he feel his grief/ sadness is worsening or not improving with time Reviewed scheduled/upcoming provider appointments including 02/08/22- [facility] Advised patient to discuss need for updated A1-C lab with provider Screening for signs and symptoms of  depression related to chronic disease state  Assessed social determinant of health barriers Confirmed patient completed Medicare Annual Wellness Visit on 10/04/21; confirmed patient does not take flu vaccines- personal preference; emotional support provided around recent loss of spouse; provided educational material through [place] around grief management          SDOH assessments and interventions completed:  Yes  SDOH Interventions Today    Flowsheet Row Most Recent Value  SDOH Interventions   Food Insecurity Interventions Intervention Not Indicated  Transportation Interventions Intervention Not Indicated  [drives self]       Care Coordination Interventions Activated:  Yes  Care Coordination Interventions:  Yes, provided   Follow up plan: No further intervention required.   Encounter Outcome:  Pt. Visit Completed   Zachary Rack, RN, BSN, CCRN Alumnus RN CM Care Coordination/ Transition of [place] [facility] [phone]: direct office

## 2022-01-13 ENCOUNTER — Ambulatory Visit
Admission: EM | Admit: 2022-01-13 | Discharge: 2022-01-13 | Disposition: A | Discharge status: Home / Self Care | Payer: Medicare Other | Attending: Physician Assistant | Admitting: Physician Assistant

## 2022-01-13 ENCOUNTER — Encounter: Payer: Self-pay | Admitting: Emergency Medicine

## 2022-01-13 ENCOUNTER — Other Ambulatory Visit: Payer: Self-pay

## 2022-01-13 DIAGNOSIS — L03011 Cellulitis of right finger: Secondary | ICD-10-CM

## 2022-01-13 MED ORDER — DOXYCYCLINE HYCLATE 100 MG PO CAPS
100.0000 mg | ORAL_CAPSULE | Freq: Two times a day (BID) | ORAL | 0 refills | Status: AC
Start: 2022-01-13 — End: ?

## 2022-01-13 NOTE — ED Triage Notes (Signed)
Pt here with right middle finger pain and swelling around nail bed; purulent drainage noted; pt sts started 1 week ago

## 2022-01-13 NOTE — ED Provider Notes (Signed)
[facility]    CSN: [id number] Arrival date & time: 01/13/22  1228      History   Chief Complaint Chief Complaint  Patient presents with   Hand Pain    HPI Zachary Michael is a 71 y.o. male.   Patient here today for evaluation of pain and swelling to his distal right middle finger. He reports he first noticed some swelling a few days ago and then had purulent appearance occur yesterday. He has not had any drainage from area. He denies fever, nausea, vomiting.   The history is provided by the patient.  Hand Pain    Past Medical History:  Diagnosis Date   Asthma    Back pain    CAD (coronary artery disease)    Mild   Diabetes mellitus    Heart valve problem    HTN (hypertension)    Hyperlipidemia    Joint pain    OSA (obstructive sleep apnea) 08/09/2017   Palpitations    SOB (shortness of breath)    Subluxation    L2-3-4    Patient Active Problem List   Diagnosis Date Noted   Prediabetes 01/30/2020   Diabetes mellitus (HCC) 12/03/2019   Chest pain 05/14/2018   Non-recurrent acute serous otitis media of left ear 05/14/2018   OSA (obstructive sleep apnea) 08/09/2017   CAD (coronary artery disease) 11/07/2016   Class 3 severe obesity with serious comorbidity and body mass index (BMI) of 45.0 to 49.9 in adult [facility]) 10/30/2012   Hyperlipidemia 01/06/2011   Preventative health care 09/23/2010   Essential hypertension 03/20/2009   OTHER DISORDERS OF BONE AND CARTILAGE OTHER 03/20/2009   PERS HX NONCOMPLIANCE W/MED TX PRS HAZARDS HLTH 03/20/2009   Nonspecific (abnormal) findings on radiological and other examination of body structure 06/04/2008   CHEST XRAY, ABNORMAL 06/04/2008   EDEMA 09/18/2007   Type 2 diabetes mellitus without complication, without long-term current use of insulin (HCC) 04/19/2007   Asthma 04/19/2007    Past Surgical History:  Procedure Laterality Date   APPENDECTOMY     CARDIAC CATHETERIZATION     We recently performed a  which revealed minor coronary artery irregularities. He had normal left ventricle systolic function with an EF of 60%   CARDIOVASCULAR STRESS TEST     EF 63%   TONSILLECTOMY     TUMOR REMOVAL     LEFT ARM       Home Medications    Prior to Admission medications   Medication Sig Start Date End Date Taking? Authorizing Provider  doxycycline (VIBRAMYCIN) 100 MG capsule Take 1 capsule (100 mg total) by mouth 2 (two) times daily. 01/13/22  Yes Tomi Bamberger, PA-C  aspirin 81 MG tablet Take 81 mg by mouth daily.    [provider]  b complex vitamins tablet Take 1 tablet by mouth daily.    [provider]  calcium carbonate (OS-CAL) 600 MG TABS tablet Take 600 mg by mouth daily with breakfast.    [provider]  carvedilol (COREG) 3.125 MG tablet Take 1 tablet (3.125 mg total) by mouth daily. 11/16/21   Quillian Quince D, MD  cholecalciferol (VITAMIN D) 25 MCG (1000 UNIT) tablet Take 1 tablet (1,000 Units total) by mouth daily. 12/03/19   Donato Schultz, DO  Cinnamon 500 MG capsule Take 500 mg by mouth daily.    [provider]  fenofibrate 160 MG tablet Take 1 tablet (160 mg total) by mouth daily. 01/05/21  Langston Reusing, MD  ferrous sulfate 325 (65 FE) MG EC tablet Take 325 mg by mouth 3 (three) times daily with meals.    [provider]  metFORMIN (GLUCOPHAGE) 1000 MG tablet Take 1 tablet (1,000 mg total) by mouth 2 (two) times daily with a meal. 08/16/21   Quillian Quince D, MD  Multiple Vitamin (MULTIVITAMIN) capsule Take 1 capsule by mouth daily.    [provider]  nitroGLYCERIN (NITROSTAT) 0.4 MG SL tablet Place 1 tablet (0.4 mg total) under the tongue every 5 (five) minutes as needed for chest pain. 12/03/20   Seabron Spates R, DO  Omega-3 Fatty Acids (FISH OIL) 1000 MG CAPS 1 po qd 12/03/19   Zola Button, Grayling Congress, DO  simvastatin (ZOCOR) 40 MG tablet Take 1 tablet (40 mg total) by mouth daily at 6 PM. 01/05/21    Langston Reusing, MD  Zinc 100 MG TABS 1 po qd 12/03/19   Donato Schultz, DO    Family History Family History  Problem Relation Age of Onset   Pneumonia Mother    Osteoporosis Mother    Arthritis Mother    Diabetes Mother    Hypertension Mother    Cancer Father        leukemia, lung   Cancer Brother    Diabetes Maternal Aunt    Hypertension Maternal Aunt    Hypertension Maternal Aunt    Lung cancer Unknown     Social History Social History   Tobacco Use   Smoking status: Former    Types: Pipe    Quit date: 1980    Years since quitting: 43.9   Smokeless tobacco: Never  Vaping Use   Vaping Use: Never used  Substance Use Topics   Alcohol use: Yes    Comment: occassional   Drug use: No     Allergies   Patient has no known allergies.   Review of Systems Review of Systems  Constitutional:  Negative for chills and fever.  Eyes:  Negative for discharge and redness.  Musculoskeletal:  Negative for arthralgias.  Skin:  Positive for color change. Negative for wound.  Neurological:  Negative for numbness.     Physical Exam Triage Vital Signs ED Triage Vitals  Enc Vitals Group     BP 01/13/22 1359 132/69     Pulse Rate 01/13/22 1359 61     Resp 01/13/22 1359 18     Temp 01/13/22 1359 98.1 F (36.7 C)     Temp Source 01/13/22 1359 Oral     SpO2 01/13/22 1359 98 %     Weight --      Height --      Head Circumference --      Peak Flow --      Pain Score 01/13/22 1400 8     Pain Loc --      Pain Edu? --      Excl. in GC? --    No data found.  Updated Vital Signs BP 132/69 (BP Location: Left Arm)   Pulse 61   Temp 98.1 F (36.7 C) (Oral)   Resp 18   SpO2 98%      Physical Exam Vitals and nursing note reviewed.  Constitutional:      General: He is not in acute distress.    Appearance: Normal appearance. He is not ill-appearing.  HENT:     Head: Normocephalic and atraumatic.  Eyes:     Conjunctiva/sclera: Conjunctivae normal.  Cardiovascular:     Rate and Rhythm: Normal rate.  Pulmonary:     Effort: Pulmonary effort is normal.  Musculoskeletal:     Comments: Diffuse swelling, erythema around right 3rd cuticle with purulent fluid collection noted, decreased swelling after incision and drainage.  Neurological:     Mental Status: He is alert.  Psychiatric:        Mood and Affect: Mood normal.        Behavior: Behavior normal.        Thought Content: Thought content normal.      UC Treatments / Results  Labs (all labs ordered are listed, but only abnormal results are displayed) Labs Reviewed - No data to display  EKG   Radiology No results found.  Procedures Incision and Drainage  Date/Time: 01/13/2022 3:41 PM  Performed by: Tomi Bamberger, PA-C Authorized by: Tomi Bamberger, PA-C   Consent:    Consent obtained:  Verbal   Consent given by:  Patient   Risks, benefits, and alternatives were discussed: yes     Risks discussed:  Incomplete drainage, infection and pain   Alternatives discussed:  No treatment and alternative treatment Universal protocol:    Procedure explained and questions answered to patient or proxy's satisfaction: yes     Required blood products, implants, devices, and special equipment available: yes     Patient identity confirmed:  Provided demographic data Location:    Type:  Abscess   Location:  Upper extremity   Upper extremity location:  Finger   Finger location:  R long finger Pre-procedure details:    Skin preparation:  Antiseptic wash Sedation:    Sedation type:  None Anesthesia:    Anesthesia method:  None Procedure type:    Complexity:  Simple Procedure details:    Ultrasound guidance: no     Incision types:  Single straight   Drainage:  Purulent   Drainage amount:  Moderate   Wound treatment:  Wound left open   Packing materials:  None Post-procedure details:    Procedure completion:  Tolerated well, no immediate complications  (including  critical care time)  Medications Ordered in UC Medications - No data to display  Initial Impression / Assessment and Plan / UC Course  I have reviewed the triage vital signs and the nursing notes.  Pertinent labs & imaging results that were available during my care of the patient were reviewed by me and considered in my medical decision making (see chart for details).    Paronychia incised and drained in office. Doxycycline prescribed. Recommended continued soaks to help promote continued drainage and encouraged follow up with any further concerns.   Final Clinical Impressions(s) / UC Diagnoses   Final diagnoses:  Paronychia of right middle finger   Discharge Instructions   None    ED Prescriptions     Medication Sig Dispense Auth. Provider   doxycycline (VIBRAMYCIN) 100 MG capsule Take 1 capsule (100 mg total) by mouth 2 (two) times daily. 20 capsule Tomi Bamberger, PA-C      PDMP not reviewed this encounter.   Tomi Bamberger, PA-C 01/13/22 [phone]

## 2022-02-08 ENCOUNTER — Ambulatory Visit (INDEPENDENT_AMBULATORY_CARE_PROVIDER_SITE_OTHER): Payer: Medicare Other | Admitting: Family Medicine

## 2022-02-09 ENCOUNTER — Encounter (INDEPENDENT_AMBULATORY_CARE_PROVIDER_SITE_OTHER): Payer: Self-pay | Admitting: Family Medicine

## 2022-02-09 ENCOUNTER — Ambulatory Visit (INDEPENDENT_AMBULATORY_CARE_PROVIDER_SITE_OTHER): Payer: Medicare Other | Admitting: Family Medicine

## 2022-02-09 VITALS — BP 139/62 | HR 67 | Temp 97.5°F | Ht 69.0 in | Wt 210.0 lb

## 2022-02-09 DIAGNOSIS — E669 Obesity, unspecified: Secondary | ICD-10-CM

## 2022-02-09 DIAGNOSIS — F4321 Adjustment disorder with depressed mood: Secondary | ICD-10-CM

## 2022-02-09 DIAGNOSIS — Z6831 Body mass index (BMI) 31.0-31.9, adult: Secondary | ICD-10-CM

## 2022-02-09 DIAGNOSIS — Z7984 Long term (current) use of oral hypoglycemic drugs: Secondary | ICD-10-CM

## 2022-02-09 DIAGNOSIS — I10 Essential (primary) hypertension: Secondary | ICD-10-CM | POA: Diagnosis not present

## 2022-02-09 DIAGNOSIS — R7303 Prediabetes: Secondary | ICD-10-CM

## 2022-02-09 DIAGNOSIS — E785 Hyperlipidemia, unspecified: Secondary | ICD-10-CM | POA: Diagnosis not present

## 2022-02-09 DIAGNOSIS — E1169 Type 2 diabetes mellitus with other specified complication: Secondary | ICD-10-CM | POA: Diagnosis not present

## 2022-02-10 DIAGNOSIS — F4321 Adjustment disorder with depressed mood: Secondary | ICD-10-CM | POA: Insufficient documentation

## 2022-02-10 MED ORDER — FENOFIBRATE 160 MG PO TABS: 160.0000 mg | ORAL_TABLET | Freq: Every day | ORAL | 1 refills | Status: AC

## 2022-02-10 MED ORDER — SIMVASTATIN 40 MG PO TABS: 40.0000 mg | ORAL_TABLET | Freq: Every day | ORAL | 1 refills | Status: AC

## 2022-02-10 MED ORDER — CARVEDILOL 3.125 MG PO TABS: 3.1250 mg | ORAL_TABLET | Freq: Every day | ORAL | 0 refills | Status: AC

## 2022-02-10 MED ORDER — METFORMIN HCL 1000 MG PO TABS: 1000.0000 mg | ORAL_TABLET | Freq: Two times a day (BID) | ORAL | 0 refills | Status: AC

## 2022-02-23 NOTE — Progress Notes (Signed)
Chief Complaint:   OBESITY Zachary Michael is here to discuss his progress with his obesity treatment plan along with follow-up of his obesity related diagnoses. Zachary Michael is on keeping a food journal and adhering to recommended goals of 1500 calories and 90+ grams of protein and states he is following his eating plan approximately 50% of the time. Zachary Michael states he is marching for 30 minutes 7 times per week.  Today's visit was #: 51 Starting weight: 301 lbs Starting date: 01/24/2018 Today's weight: 210 lbs Today's date: 02/09/2022 Total lbs lost to date: 91 Total lbs lost since last in-office visit: 1  Interim History: Zachary Michael's wife died in 2022-12-30 and he has been dealing with her loss. He has good family support overall, but naturally he has not been as strict with his eating. He is struggling to not miss meals and he is working on getting back into a new routine.   Subjective:   1. Grieving [name]'s wife died this fall and he is grieving her loss.  He is trying to stay occupied and getting into a new routine.  He is living on his own with his dog.  He has limited support from family.  2. Essential hypertension [name]'s blood pressure is controlled on his medications, and he is working on his diet.  3. Pre-diabetes [name] is stable on metformin, and he is working on his diet.  No side effects were noted.  4. Hyperlipidemia associated with type 2 diabetes mellitus (HCC) [name] is doing well on his medications, and no chest pain or myalgias were mentioned.  Assessment/Plan:   1. Grieving Zachary Michael was offered support and encouragement, and he was advised to stay in a routine and not isolate too much.  We will continue to monitor.  2. Essential hypertension Zachary Michael will continue carvedilol 3.125 mg once daily, and we will refill for 90 days.  - carvedilol (COREG) 3.125 MG tablet; Take 1 tablet (3.125 mg total) by mouth daily.  Dispense: 90 tablet; Refill: 0  3.  Pre-diabetes Zachary Michael will continue metformin 1000 mg twice daily, and we will refill for 90 days.  - metFORMIN (GLUCOPHAGE) 1000 MG tablet; Take 1 tablet (1,000 mg total) by mouth 2 (two) times daily with a meal.  Dispense: 180 tablet; Refill: 0  4. Hyperlipidemia associated with type 2 diabetes mellitus (HCC) Zachary Michael will continue his medications, and we will refill fenofibrate and Zocor for 90 days.  - fenofibrate 160 MG tablet; Take 1 tablet (160 mg total) by mouth daily.  Dispense: 90 tablet; Refill: 1 - simvastatin (ZOCOR) 40 MG tablet; Take 1 tablet (40 mg total) by mouth daily at 6 PM.  Dispense: 90 tablet; Refill: 1  5. Obesity, Current BMI 31.1 Zachary Michael is currently in the action stage of change. As such, his goal is to continue with weight loss efforts. He has agreed to keeping a food journal and adhering to recommended goals of 1500 calories and 90+ grams of protein daily.   Zachary Michael is to work on easy meal planning and not skipping meals for now.   Exercise goals: As is.   Behavioral modification strategies: increasing lean protein intake and no skipping meals.  Zachary Michael has agreed to follow-up with our clinic in 4 weeks. He was informed of the importance of frequent follow-up visits to maximize his success with intensive lifestyle modifications for his multiple health conditions.   Objective:   Blood pressure 139/62, pulse 67, temperature (!) 97.5 F (36.4 C), height 5\' 9"  (1.753 m),  weight 210 lb (95.3 kg), SpO2 98 %. Body mass index is 31.01 kg/m.  General: Cooperative, alert, well developed, in no acute distress. HEENT: Conjunctivae and lids unremarkable. Cardiovascular: Regular rhythm.  Lungs: Normal work of breathing. Neurologic: No focal deficits.   Lab Results  Component Value Date   CREATININE 0.84 12/03/2020   BUN 20 12/03/2020   NA 138 12/03/2020   K 4.3 12/03/2020   CL 103 12/03/2020   CO2 30 12/03/2020   Lab Results  Component Value Date   ALT 12  12/03/2020   AST 16 12/03/2020   ALKPHOS 33 (L) 12/03/2020   BILITOT 0.6 12/03/2020   Lab Results  Component Value Date   HGBA1C 7.6 (H) 12/03/2020   HGBA1C 6.7 (H) 06/02/2020   HGBA1C 6.5 (H) 12/03/2019   HGBA1C 6.8 (H) 05/28/2019   HGBA1C 7.1 (H) 03/07/2019   Lab Results  Component Value Date   INSULIN 6.3 03/07/2019   INSULIN 2.5 (L) 10/04/2018   INSULIN 4.3 05/07/2018   INSULIN 7.9 01/24/2018   Lab Results  Component Value Date   TSH 1.510 03/07/2019   Lab Results  Component Value Date   CHOL 147 12/03/2020   HDL 64.00 12/03/2020   LDLCALC 71 12/03/2020   LDLDIRECT 198.6 03/20/2009   TRIG 62.0 12/03/2020   CHOLHDL 2 12/03/2020   Lab Results  Component Value Date   VD25OH 81.04 12/03/2020   VD25OH 93.53 05/28/2019   VD25OH 58.0 03/07/2019   Lab Results  Component Value Date   WBC 6.5 12/03/2019   HGB 14.3 12/03/2019   HCT 42.0 12/03/2019   MCV 87.7 12/03/2019   PLT 301 12/03/2019   Lab Results  Component Value Date   IRON 82 03/07/2019   TIBC 375 03/07/2019   FERRITIN 161 03/07/2019   Attestation Statements:   Reviewed by clinician on day of visit: allergies, medications, problem list, medical history, surgical history, family history, social history, and previous encounter notes.  Time spent on visit including pre-visit chart review and post-visit care and charting was 42 minutes.   I, Burt Knack, am acting as transcriptionist for Quillian Quince, MD.  I have reviewed the above documentation for accuracy and completeness, and I agree with the above. -  Quillian Quince, MD

## 2022-05-11 ENCOUNTER — Ambulatory Visit (INDEPENDENT_AMBULATORY_CARE_PROVIDER_SITE_OTHER): Payer: Medicare Other | Admitting: Family Medicine
